# Patient Record
Sex: Female | Born: 1980 | Race: Black or African American | Hispanic: No | Marital: Married | Smoking: Never smoker
Health system: Southern US, Community
[De-identification: ages and names within clinical notes are randomized; demographics above are authoritative.]

## PROBLEM LIST (undated history)

## (undated) ENCOUNTER — Inpatient Hospital Stay (HOSPITAL_COMMUNITY): Payer: Self-pay

## (undated) DIAGNOSIS — K219 Gastro-esophageal reflux disease without esophagitis: Secondary | ICD-10-CM

## (undated) DIAGNOSIS — Z9221 Personal history of antineoplastic chemotherapy: Secondary | ICD-10-CM

## (undated) DIAGNOSIS — L709 Acne, unspecified: Secondary | ICD-10-CM

## (undated) DIAGNOSIS — F329 Major depressive disorder, single episode, unspecified: Secondary | ICD-10-CM

## (undated) DIAGNOSIS — F32A Depression, unspecified: Secondary | ICD-10-CM

## (undated) DIAGNOSIS — K589 Irritable bowel syndrome without diarrhea: Secondary | ICD-10-CM

## (undated) DIAGNOSIS — R7303 Prediabetes: Secondary | ICD-10-CM

## (undated) DIAGNOSIS — C50911 Malignant neoplasm of unspecified site of right female breast: Secondary | ICD-10-CM

## (undated) DIAGNOSIS — Z87820 Personal history of traumatic brain injury: Secondary | ICD-10-CM

## (undated) DIAGNOSIS — Z923 Personal history of irradiation: Secondary | ICD-10-CM

## (undated) HISTORY — DX: Gastro-esophageal reflux disease without esophagitis: K21.9

## (undated) HISTORY — PX: WISDOM TOOTH EXTRACTION: SHX21

## (undated) HISTORY — DX: Irritable bowel syndrome, unspecified: K58.9

---

## 2010-08-26 ENCOUNTER — Encounter: Admission: RE | Admit: 2010-08-26 | Discharge: 2010-08-26 | Payer: Self-pay | Admitting: Internal Medicine

## 2011-08-06 ENCOUNTER — Encounter: Payer: Self-pay | Admitting: Internal Medicine

## 2011-08-19 ENCOUNTER — Ambulatory Visit: Payer: Self-pay | Admitting: Internal Medicine

## 2011-09-12 ENCOUNTER — Emergency Department (HOSPITAL_COMMUNITY)
Admission: EM | Admit: 2011-09-12 | Discharge: 2011-09-13 | Disposition: A | Discharge status: Home / Self Care | Payer: 59 | Attending: Emergency Medicine | Admitting: Emergency Medicine

## 2011-09-12 ENCOUNTER — Emergency Department (HOSPITAL_COMMUNITY): Payer: 59

## 2011-09-12 DIAGNOSIS — S62639A Displaced fracture of distal phalanx of unspecified finger, initial encounter for closed fracture: Secondary | ICD-10-CM | POA: Insufficient documentation

## 2011-09-12 DIAGNOSIS — W230XXA Caught, crushed, jammed, or pinched between moving objects, initial encounter: Secondary | ICD-10-CM | POA: Insufficient documentation

## 2011-09-12 DIAGNOSIS — M7989 Other specified soft tissue disorders: Secondary | ICD-10-CM | POA: Insufficient documentation

## 2011-09-12 DIAGNOSIS — Y92009 Unspecified place in unspecified non-institutional (private) residence as the place of occurrence of the external cause: Secondary | ICD-10-CM | POA: Insufficient documentation

## 2011-12-18 ENCOUNTER — Ambulatory Visit (INDEPENDENT_AMBULATORY_CARE_PROVIDER_SITE_OTHER): Payer: 59

## 2011-12-18 DIAGNOSIS — H6990 Unspecified Eustachian tube disorder, unspecified ear: Secondary | ICD-10-CM

## 2011-12-18 DIAGNOSIS — J301 Allergic rhinitis due to pollen: Secondary | ICD-10-CM

## 2011-12-18 DIAGNOSIS — J069 Acute upper respiratory infection, unspecified: Secondary | ICD-10-CM

## 2011-12-18 DIAGNOSIS — H6980 Other specified disorders of Eustachian tube, unspecified ear: Secondary | ICD-10-CM

## 2012-06-22 ENCOUNTER — Encounter (INDEPENDENT_AMBULATORY_CARE_PROVIDER_SITE_OTHER): Payer: 59 | Admitting: Physician Assistant

## 2012-09-21 NOTE — Progress Notes (Signed)
This encounter was created in error - please disregard.

## 2013-02-04 ENCOUNTER — Ambulatory Visit (INDEPENDENT_AMBULATORY_CARE_PROVIDER_SITE_OTHER): Payer: 59 | Admitting: Family Medicine

## 2013-02-04 VITALS — BP 98/60 | HR 66 | Temp 98.4°F | Resp 16 | Ht 64.0 in | Wt 155.0 lb

## 2013-02-04 DIAGNOSIS — R0982 Postnasal drip: Secondary | ICD-10-CM

## 2013-02-04 DIAGNOSIS — J039 Acute tonsillitis, unspecified: Secondary | ICD-10-CM

## 2013-02-04 DIAGNOSIS — J029 Acute pharyngitis, unspecified: Secondary | ICD-10-CM

## 2013-02-04 LAB — POCT RAPID STREP A (OFFICE): Rapid Strep A Screen: NEGATIVE

## 2013-02-04 MED ORDER — AMOXICILLIN 500 MG PO CAPS: 500.0000 mg | ORAL_CAPSULE | Freq: Two times a day (BID) | ORAL | Status: DC

## 2013-02-04 MED ORDER — FLUTICASONE PROPIONATE 50 MCG/ACT NA SUSP: 2.0000 | Freq: Every day | NASAL | Status: DC

## 2013-02-04 NOTE — Progress Notes (Signed)
[facility]:  Office Visit  Chief Complaint:  Chief Complaint  Patient presents with  . Sore Throat    1 week    HPI: Brianna [name] is a 32 y.o. female who complains of  Sore throat x 1 week, took Nyquil , cough yellow and thick. BUt only once during the day. She has laryngitis. Had slight fever,muscle aches and pain. No flu vaccine since 2012. Works at [employer]. 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 + PND.   Past Medical History  Diagnosis Date  . GERD (gastroesophageal reflux disease)    History reviewed. No pertinent past surgical history. History   Social History  . Marital Status: Single    Spouse Name: N/A    Number of Children: N/A  . Years of Education: N/A   Social History Main Topics  . Smoking status: Never Smoker   . Smokeless tobacco: None  . Alcohol Use: Yes  . Drug Use: No  . Sexually Active: None   Other Topics Concern  . None   Social History Narrative  . None   Family History  Problem Relation Age of Onset  . Heart disease Maternal Grandmother   . Stroke Maternal Grandfather    No Known Allergies Prior to Admission medications   Medication Sig Start Date End Date Taking? Authorizing Provider  Sulfacetamide Sodium-Sulfur (CLENIA FOAMING WASH) 10-5 % EMUL Apply topically.   Yes Historical Provider, MD  hydroquinone 4 % cream Apply topically 2 (two) times daily. Not sure if 2% or 4% strength.    Historical Provider, MD     [name]: The patient denies fevers, chills, night sweats, unintentional weight loss, chest pain, palpitations, wheezing, dyspnea on exertion, nausea, vomiting, abdominal pain, dysuria, hematuria, melena, numbness, weakness, or tingling.   All other systems have been reviewed and were otherwise negative with the exception of those mentioned in the HPI and as above.    PHYSICAL EXAM: Filed Vitals:   02/04/13 1152  BP: 98/60  Pulse: 66  Temp: 98.4 F (36.9 C)  Resp: 16    Filed Vitals:   02/04/13 1152  Height: 5\' 4"  (1.626 m)  Weight: 155 lb (70.308 kg)   Body mass index is 26.59 kg/(m^2).  General: Alert, no acute distress HEENT:  Normocephalic, atraumatic, oropharynx patent. TM nl. Erythematous throat. No exudates. No sinus tenderness, boggy nose. + PND Cardiovascular:  Regular rate and rhythm, no rubs murmurs or gallops.  No Carotid bruits, radial pulse intact. No pedal edema.  Respiratory: Clear to auscultation bilaterally.  No wheezes, rales, or rhonchi.  No cyanosis, no use of accessory musculature GI: No organomegaly, abdomen is soft and non-tender, positive bowel sounds.  No masses. Skin: No rashes. Neurologic: Facial musculature symmetric. Psychiatric: Patient is appropriate throughout our interaction. Lymphatic: No cervical lymphadenopathy Musculoskeletal: Gait intact.   LABS: Results for orders placed in visit on 02/04/13  POCT RAPID STREP A (OFFICE)      Result Value Range   Rapid Strep A Screen Negative  Negative     EKG/XRAY:   Primary read interpreted by Dr. Conley Rolls at [facility].   ASSESSMENT/PLAN: Encounter Diagnoses  Name Primary?  . Acute pharyngitis Yes  . Acute tonsillitis   . Post-nasal drip    Will do trial of flonase and salt water gargles, if no improvement then may take abx May take abx Amoxacillin 500 mg BID x 10 days.  Rx Diflucan 150 mg      Brianna [name],  Brianna Diebold PHUONG, DO 02/04/2013 12:25 PM

## 2013-02-15 ENCOUNTER — Telehealth: Payer: Self-pay

## 2013-02-15 NOTE — Telephone Encounter (Signed)
PT STATES SHE NEED TO HAVE [place] CALL IN A DIFLUCAN TO THE PHARMACY. WAS TOLD SHE COULD GET IF NEEDED. PLEASE CALL [phone]    [facility] [address]

## 2013-02-16 MED ORDER — FLUCONAZOLE 150 MG PO TABS: 150.0000 mg | ORAL_TABLET | Freq: Once | ORAL | Status: DC

## 2013-02-16 NOTE — Telephone Encounter (Signed)
Sent in per protocol she just completed Antibiotics

## 2013-04-12 ENCOUNTER — Ambulatory Visit (INDEPENDENT_AMBULATORY_CARE_PROVIDER_SITE_OTHER): Payer: 59 | Admitting: Family Medicine

## 2013-04-12 VITALS — BP 108/62 | HR 80 | Temp 98.8°F | Resp 16 | Ht 64.0 in | Wt 157.6 lb

## 2013-04-12 DIAGNOSIS — R0982 Postnasal drip: Secondary | ICD-10-CM

## 2013-04-12 DIAGNOSIS — J329 Chronic sinusitis, unspecified: Secondary | ICD-10-CM

## 2013-04-12 DIAGNOSIS — J040 Acute laryngitis: Secondary | ICD-10-CM

## 2013-04-12 DIAGNOSIS — J039 Acute tonsillitis, unspecified: Secondary | ICD-10-CM

## 2013-04-12 DIAGNOSIS — J029 Acute pharyngitis, unspecified: Secondary | ICD-10-CM

## 2013-04-12 MED ORDER — METHYLPREDNISOLONE ACETATE 40 MG/ML IJ SUSP
80.0000 mg | Freq: Once | INTRAMUSCULAR | Status: AC
  Administered 2013-04-12: 80 mg via INTRAMUSCULAR

## 2013-04-12 MED ORDER — FLUTICASONE PROPIONATE 50 MCG/ACT NA SUSP: 2.0000 | Freq: Every day | NASAL | Status: DC

## 2013-04-12 MED ORDER — CETIRIZINE HCL 10 MG PO TABS: 10.0000 mg | ORAL_TABLET | Freq: Every day | ORAL | Status: DC

## 2013-04-12 MED ORDER — METHYLPREDNISOLONE ACETATE 80 MG/ML IJ SUSP
80.0000 mg | Freq: Once | INTRAMUSCULAR | Status: DC
Start: 2013-04-12 — End: 2013-04-12

## 2013-04-12 NOTE — Patient Instructions (Addendum)
Hot showers or breathing in steam may help loosen the congestion.  Using a netti pot or sinus rinse is also likely to help you feel better and keep this from progressing.  Use the fluticasone nasal spray every night before bed for at least 2 weeks.  I recommend augmenting with 12 hr sudafed (behind the counter) and generic mucinex to help you move out the congestion.  If no improvement or you are getting worse, come back as you might need a course of steroids or antibiotics but hopefully with all of the above, you can avoid it.  Laryngitis At the top of your windpipe is your voice box. It is the source of your voice. Inside your voice box are 2 bands of muscles called vocal cords. When you breathe, your vocal cords are relaxed and open so that air can get into the lungs. When you decide to say something, these cords come together and vibrate. The sound from these vibrations goes into your throat and comes out through your mouth as sound. Laryngitis is an inflammation of the vocal cords that causes hoarseness, cough, loss of voice, sore throat, and dry throat. Laryngitis can be temporary (acute) or long-term (chronic). Most cases of acute laryngitis improve with time.Chronic laryngitis lasts for more than 3 weeks. CAUSES Laryngitis can often be related to excessive smoking, talking, or yelling, as well as inhalation of toxic fumes and allergies. Acute laryngitis is usually caused by a viral infection, vocal strain, measles or mumps, or bacterial infections. Chronic laryngitis is usually caused by vocal cord strain, vocal cord injury, postnasal drip, growths on the vocal cords, or acid reflux. SYMPTOMS   Cough.  Sore throat.  Dry throat. RISK FACTORS  Respiratory infections.  Exposure to irritating substances, such as cigarette smoke, excessive amounts of alcohol, stomach acids, and workplace chemicals.  Voice trauma, such as vocal cord injury from shouting or speaking too loud. DIAGNOSIS  Your  cargiver will perform a physical exam. During the physical exam, your caregiver will examine your throat. The most common sign of laryngitis is hoarseness. Laryngoscopy may be necessary to confirm the diagnosis of this condition. This procedure allows your caregiver to look into the larynx. HOME CARE INSTRUCTIONS  Drink enough fluids to keep your urine clear or pale yellow.  Rest until you no longer have symptoms or as directed by your caregiver.  Breathe in moist air.  Take all medicine as directed by your caregiver.  Do not smoke.  Talk as little as possible (this includes whispering).  Write on paper instead of talking until your voice is back to normal.  Follow up with your caregiver if your condition has not improved after 10 days. SEEK MEDICAL CARE IF:   You have trouble breathing.  You cough up blood.  You have persistent fever.  You have increasing pain.  You have difficulty swallowing. MAKE SURE YOU:  Understand these instructions.  Will watch your condition.  Will get help right away if you are not doing well or get worse. Document Released: 11/09/2005 Document Revised: 02/01/2012 Document Reviewed: 01/15/2011 [facility] Patient Information 2014 [place], [place].

## 2013-04-12 NOTE — Progress Notes (Signed)
Subjective:    Patient ID: Brianna [name], female    DOB: 1981-11-08, 32 y.o.   MRN: [id number] Chief Complaint  Patient presents with  . Hoarse    4 days  . Sinusitis    HPI  Last wk she had a bad sinus HA followed by pnd for four days.  Then 5d prev she dev severe sore throat - had to take asa for her throat and the following day she completely  lost her voice - has been trying to rest it since but it has not come back at all.  Still w/ sore throat in evening and morning and lots of draininage and mucous in mouth - esp at night and in morning.  Did have a fever last week w/ sweats but was just finishing period so thought that that might be complicating sxs.  Not to much nasal congestion - did have an itchy throat for a day after her sinus HA - the HA was so severe she had an aura where she saw a white line through her vision and pressure above her left eye. Took claritin for a few d which seemed to help.  Not currently using any otc meds other than halls cough drops and hot tea.  Has stayed home work at week so has been letting voice rest but tried to go back to work today and after just 1 hr at [employer] was completely gone again.  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  Occ cough.  Still has tonsils. No known sick contacts.  Past Medical History  Diagnosis Date  . GERD (gastroesophageal reflux disease)    Current Outpatient Prescriptions on File Prior to Visit  Medication Sig Dispense Refill  . Sulfacetamide Sodium-Sulfur (CLENIA FOAMING WASH) 10-5 % EMUL Apply topically.       No current facility-administered medications on file prior to visit.   No Known Allergies   Review of Systems  Constitutional: Positive for fever, chills, diaphoresis, activity change, appetite change and fatigue. Negative for unexpected weight change.  HENT: Positive for sore throat, trouble swallowing, neck pain, voice change, postnasal drip and sinus pressure. Negative for ear pain,  nosebleeds, congestion, rhinorrhea, sneezing, mouth sores, neck stiffness and ear discharge.   Eyes: Negative for pain and itching.  Respiratory: Positive for cough. Negative for shortness of breath.   Cardiovascular: Negative for chest pain.  Gastrointestinal: Negative for nausea, vomiting, abdominal pain, diarrhea and constipation.  Genitourinary: Negative for dysuria.  Musculoskeletal: Positive for myalgias. Negative for joint swelling, arthralgias and gait problem.  Neurological: Positive for headaches. Negative for dizziness and syncope.  Hematological: Positive for adenopathy.  Psychiatric/Behavioral: Positive for sleep disturbance.      BP 108/62  Pulse 80  Temp(Src) 98.8 F (37.1 C) (Oral)  Resp 16  Ht 5\' 4"  (1.626 m)  Wt 157 lb 9.6 oz (71.487 kg)  BMI 27.04 kg/m2  SpO2 100%  LMP 04/01/2013 Objective:   Physical Exam  Constitutional: She is oriented to person, place, and time. She appears well-developed and well-nourished. She does not appear ill. No distress.  Voice at whisper - cracks with any phonation  HENT:  Head: Normocephalic and atraumatic.  Right Ear: External ear and ear canal normal. A middle ear effusion is present.  Left Ear: External ear and ear canal normal. A middle ear effusion is present.  Nose: Mucosal edema and rhinorrhea present. Right sinus exhibits no maxillary sinus tenderness and no frontal sinus tenderness. Left sinus exhibits no maxillary sinus  tenderness and no frontal sinus tenderness.  Mouth/Throat: Uvula is midline and mucous membranes are normal. Posterior oropharyngeal erythema present. No oropharyngeal exudate, posterior oropharyngeal edema or tonsillar abscesses.  1+ tonsils bilaterally  Eyes: Conjunctivae are normal. Right eye exhibits no discharge. Left eye exhibits no discharge. No scleral icterus.  Neck: Normal range of motion. Neck supple.  Cardiovascular: Normal rate, regular rhythm, normal heart sounds and intact distal pulses.    Pulmonary/Chest: Effort normal and breath sounds normal.  Lymphadenopathy:       Head (right side): Submandibular and tonsillar adenopathy present. No preauricular and no posterior auricular adenopathy present.       Head (left side): Submandibular and tonsillar adenopathy present. No preauricular and no posterior auricular adenopathy present.    She has cervical adenopathy.       Right cervical: Superficial cervical adenopathy present. No deep cervical and no posterior cervical adenopathy present.      Left cervical: Superficial cervical adenopathy present. No deep cervical and no posterior cervical adenopathy present.       Right: No supraclavicular adenopathy present.       Left: No supraclavicular adenopathy present.  Neurological: She is alert and oriented to person, place, and time.  Skin: Skin is warm and dry. She is not diaphoretic. No erythema.  Psychiatric: She has a normal mood and affect. Her behavior is normal.      Assessment & Plan:  Sinusitis- Plan: methylPREDNISolone acetate (DEPO-MEDROL) 80 MG/ML injection IM x 1 now, start zyrtec and sudafed.  Try netti pot.  Post-nasal drip - Plan: fluticasone (FLONASE) 50 MCG/ACT nasal spray  Laryngitis - Plan: Viral so will resolve on own but make sure no other exposures since immunosupressed w/ IM steroid - out of work x 2d  Meds ordered this encounter  Medications  . DISCONTD: methylPREDNISolone acetate (DEPO-MEDROL) 80 MG/ML injection    Sig: Inject 1 mL (80 mg total) into the muscle once.    Dispense:  5 mL    Refill:  0  . fluticasone (FLONASE) 50 MCG/ACT nasal spray    Sig: Place 2 sprays into the nose at bedtime.    Dispense:  16 g    Refill:  6  . cetirizine (ZYRTEC) 10 MG tablet    Sig: Take 1 tablet (10 mg total) by mouth daily.    Dispense:  30 tablet    Refill:  11  . methylPREDNISolone acetate (DEPO-MEDROL) injection 80 mg    Sig:

## 2013-06-26 ENCOUNTER — Encounter (HOSPITAL_COMMUNITY): Payer: Self-pay | Admitting: *Deleted

## 2013-06-26 ENCOUNTER — Inpatient Hospital Stay (HOSPITAL_COMMUNITY)
Admission: AD | Admit: 2013-06-26 | Discharge: 2013-06-26 | Disposition: A | Discharge status: Home / Self Care | Payer: 59 | Source: Ambulatory Visit | Attending: Obstetrics and Gynecology | Admitting: Obstetrics and Gynecology

## 2013-06-26 ENCOUNTER — Other Ambulatory Visit: Payer: Self-pay | Admitting: Obstetrics and Gynecology

## 2013-06-26 DIAGNOSIS — O00109 Unspecified tubal pregnancy without intrauterine pregnancy: Secondary | ICD-10-CM | POA: Insufficient documentation

## 2013-06-26 LAB — CBC WITH DIFFERENTIAL/PLATELET
Basophils Absolute: 0 10*3/uL (ref 0.0–0.1)
Basophils Relative: 0 % (ref 0–1)
Eosinophils Absolute: 0 10*3/uL (ref 0.0–0.7)
Eosinophils Relative: 0 % (ref 0–5)
HCT: 38.6 % (ref 36.0–46.0)
Hemoglobin: 13.4 g/dL (ref 12.0–15.0)
MCV: 89.1 fL (ref 78.0–100.0)
Metamyelocytes Relative: 0 %
Monocytes Relative: 7 % (ref 3–12)
Neutro Abs: 4.4 10*3/uL (ref 1.7–7.7)
Platelets: 300 10*3/uL (ref 150–400)
RBC: 4.33 MIL/uL (ref 3.87–5.11)
WBC: 8 10*3/uL (ref 4.0–10.5)

## 2013-06-26 LAB — TYPE AND SCREEN
ABO/RH(D): AB POS
Antibody Screen: NEGATIVE

## 2013-06-26 LAB — CREATININE, SERUM: Creatinine, Ser: 0.85 mg/dL (ref 0.50–1.10)

## 2013-06-26 LAB — ABO/RH: ABO/RH(D): AB POS

## 2013-06-26 MED ORDER — RHO D IMMUNE GLOBULIN 1500 UNIT/2ML IJ SOLN
300.0000 ug | Freq: Once | INTRAMUSCULAR | Status: DC
  Filled 2013-06-26: qty 2

## 2013-06-26 MED ORDER — METHOTREXATE INJECTION FOR WOMEN'S HOSPITAL
50.0000 mg/m2 | Freq: Once | INTRAMUSCULAR | Status: AC
  Administered 2013-06-26: 90 mg via INTRAMUSCULAR
  Filled 2013-06-26: qty 1.8

## 2013-06-26 NOTE — MAU Note (Signed)
NOT IN LOBBY- IN LAB

## 2013-06-26 NOTE — MAU Note (Signed)
PT SAYS SHE WENT TO OFFICE LAST WEEK TO CONFIRM PREG-  BY BLOOD-  PT HAD ALREADY HOME PREG TEST. Brianna [name] HAD SPOTTING LAST WEEK.    THEN SHE HAD LIGHT SPOTTING TODAY-  SO SHE WENT TO DR OFFICE.- AT 430PM.-   HAD U/S-LEFT ECTOPIC -  SENT HER HERE.    DR COUSINS  DISCUSSED WITH PT ABOUT METHOTREXATE.

## 2013-07-03 ENCOUNTER — Inpatient Hospital Stay (HOSPITAL_COMMUNITY)
Admission: AD | Admit: 2013-07-03 | Discharge: 2013-07-03 | Disposition: A | Discharge status: Home / Self Care | Payer: 59 | Source: Ambulatory Visit | Attending: Obstetrics and Gynecology | Admitting: Obstetrics and Gynecology

## 2013-07-03 ENCOUNTER — Other Ambulatory Visit: Payer: Self-pay | Admitting: Obstetrics and Gynecology

## 2013-07-03 DIAGNOSIS — O00109 Unspecified tubal pregnancy without intrauterine pregnancy: Secondary | ICD-10-CM | POA: Insufficient documentation

## 2013-07-03 LAB — BUN: BUN: 8 mg/dL (ref 6–23)

## 2013-07-03 LAB — CBC WITH DIFFERENTIAL/PLATELET
Basophils Absolute: 0 10*3/uL (ref 0.0–0.1)
Basophils Relative: 0 % (ref 0–1)
Eosinophils Absolute: 0 10*3/uL (ref 0.0–0.7)
HCT: 36.7 % (ref 36.0–46.0)
Hemoglobin: 12.5 g/dL (ref 12.0–15.0)
MCH: 30.5 pg (ref 26.0–34.0)
MCHC: 34.1 g/dL (ref 30.0–36.0)
Monocytes Absolute: 0.5 10*3/uL (ref 0.1–1.0)
Monocytes Relative: 9 % (ref 3–12)
Neutrophils Relative %: 46 % (ref 43–77)
RDW: 13.6 % (ref 11.5–15.5)

## 2013-07-03 LAB — AST: AST: 15 U/L (ref 0–37)

## 2013-07-03 MED ORDER — METHOTREXATE INJECTION FOR WOMEN'S HOSPITAL
50.0000 mg/m2 | Freq: Once | INTRAMUSCULAR | Status: AC
  Administered 2013-07-03: 90 mg via INTRAMUSCULAR
  Filled 2013-07-03: qty 1.8

## 2013-07-03 MED ORDER — METHOTREXATE INJECTION FOR WOMEN'S HOSPITAL: 50.0000 mg/m2 | Freq: Once | INTRAMUSCULAR | Status: DC

## 2013-07-03 NOTE — MAU Note (Signed)
Pt here for MTX injection sent from office. Labs called to Dr. Cherly Hensen.

## 2013-11-29 ENCOUNTER — Ambulatory Visit (INDEPENDENT_AMBULATORY_CARE_PROVIDER_SITE_OTHER): Payer: 59 | Admitting: Physician Assistant

## 2013-11-29 VITALS — BP 118/72 | HR 104 | Temp 101.2°F | Resp 18 | Ht 64.0 in | Wt 169.0 lb

## 2013-11-29 DIAGNOSIS — R509 Fever, unspecified: Secondary | ICD-10-CM

## 2013-11-29 DIAGNOSIS — J111 Influenza due to unidentified influenza virus with other respiratory manifestations: Secondary | ICD-10-CM

## 2013-11-29 DIAGNOSIS — R059 Cough, unspecified: Secondary | ICD-10-CM

## 2013-11-29 DIAGNOSIS — R05 Cough: Secondary | ICD-10-CM

## 2013-11-29 LAB — POCT INFLUENZA A/B
INFLUENZA A, POC: POSITIVE
Influenza B, POC: NEGATIVE

## 2013-11-29 MED ORDER — ALBUTEROL SULFATE HFA 108 (90 BASE) MCG/ACT IN AERS: 2.0000 | INHALATION_SPRAY | RESPIRATORY_TRACT | Status: DC | PRN

## 2013-11-29 MED ORDER — ALBUTEROL SULFATE (2.5 MG/3ML) 0.083% IN NEBU: 2.5000 mg | INHALATION_SOLUTION | Freq: Once | RESPIRATORY_TRACT | Status: DC

## 2013-11-29 MED ORDER — E-Z SPACER DEVI: Status: DC

## 2013-11-29 MED ORDER — OSELTAMIVIR PHOSPHATE 75 MG PO CAPS: 75.0000 mg | ORAL_CAPSULE | Freq: Two times a day (BID) | ORAL | Status: DC

## 2013-11-29 MED ORDER — HYDROCODONE-HOMATROPINE 5-1.5 MG/5ML PO SYRP: ORAL_SOLUTION | ORAL | Status: DC

## 2013-11-29 MED ORDER — IPRATROPIUM BROMIDE 0.02 % IN SOLN: 0.5000 mg | Freq: Once | RESPIRATORY_TRACT | Status: DC

## 2013-11-29 NOTE — Progress Notes (Signed)
Subjective:    Patient ID: Brianna [name], female    DOB: 08/31/1981, 33 y.o.   MRN: [id number]  HPI Primary Physician: No primary provider on file.  Chief Complaint: Flu like symptoms for 3-4 days.   HPI: 33 y.o. female with history below presents with a 3-4 day history of nasal congestion, rhinorrhea, sore throat, post nasal drip, cough, fever, chills, myalgias, and fatigue. Uncertain of T max. Cough is productive of clear sputum and not associated with time of day. No SOB or wheezing. Sinus headaches. No neck stiffness. Muffled hearing. Decreased appetite. Pushing fluids. She has gone to work everyday this week. Her boyfriend is also sick with similar symptoms. She did not get an influenza vaccine this year. No history of being immunocompromised or lung issues .    Past Medical History  Diagnosis Date  . GERD (gastroesophageal reflux disease)      Home Meds: Prior to Admission medications   Medication Sig Start Date End Date Taking? Authorizing Provider  benzoyl peroxide (KP BENZOYL PEROXIDE) 5 % gel Apply 1 application topically 2 (two) times daily.   Yes Historical Provider, MD  famotidine (PEPCID) 10 MG tablet Take 10 mg by mouth daily.   Yes Historical Provider, MD  hydroquinone 4 % cream Apply 1 application topically 2 (two) times daily.   Yes Historical Provider, MD  Sulfacetamide Sodium-Sulfur (CLENIA FOAMING Phil Campbell) 10-5 % EMUL Apply topically.   Yes Historical Provider, MD    Allergies: No Known Allergies  History   Social History  . Marital Status: Single    Spouse Name: N/A    Number of Children: N/A  . Years of Education: N/A   Occupational History  . Not on file.   Social History Main Topics  . Smoking status: Never Smoker   . Smokeless tobacco: Not on file  . Alcohol Use: Yes  . Drug Use: No  . Sexual Activity: Not on file   Other Topics Concern  . Not on file   Social History Narrative  . No narrative on file       Review of Systems    Constitutional: Positive for fever, chills, appetite change and fatigue.       Myalgias  HENT: Positive for congestion, hearing loss, postnasal drip, rhinorrhea, sinus pressure, sneezing and sore throat. Negative for ear pain and tinnitus.   Eyes: Positive for photophobia.  Respiratory: Positive for cough. Negative for shortness of breath and wheezing.        Cough is productive in the morning.   Gastrointestinal: Negative for nausea, vomiting and diarrhea.  Neurological: Positive for headaches.       Sinus pressure. No neck stiffness.        Objective:   Physical Exam  Physical Exam: Blood pressure 118/72, pulse 104, temperature 101.2 F (38.4 C), resp. rate 18, height 5\' 4"  (1.626 m), weight 169 lb (76.658 kg), last menstrual period 11/16/2013, SpO2 96.00%, unknown if currently breastfeeding., Body mass index is 28.99 kg/(m^2). General: Well developed, well nourished, in no acute distress. Head: Normocephalic, atraumatic, eyes without discharge, sclera non-icteric, nares are without discharge. Bilateral auditory canals clear, TM's are without perforation, pearly grey and translucent with reflective cone of light bilaterally. Oral cavity moist, posterior pharynx without exudate, erythema, peritonsillar abscess, or post nasal drip.  Neck: Supple. No thyromegaly. Full ROM. No lymphadenopathy. Lungs: Clear bilaterally to auscultation with mild expiratory wheezes. No rales or rhonchi. Breathing is unlabored. Status post nebulizer patient feels: much better. Heart: RRR  with S1 S2. No murmurs, rubs, or gallops appreciated. Msk:  Strength and tone normal for age. Extremities/Skin: Warm and dry. No clubbing or cyanosis. No edema. No rashes or suspicious lesions. Neuro: Alert and oriented X 3. Moves all extremities spontaneously. Gait is normal. CNII-XII grossly in tact. Psych:  Responds to questions appropriately with a normal affect.   Labs: Results for orders placed in visit on 11/29/13   POCT INFLUENZA A/B      Result Value Range   Influenza A, POC Positive     Influenza B, POC Negative          Assessment & Plan:  33 year old female with influenza A, cough, fever, chills, and myalgias -Tamiflu 75 mg 1 po bid #10 no RF -Proventil 2 puffs inhaled q 4-6 hours prn #1 no RF -Hycodan #4oz 1 tsp po q 4-6 hours prn cough no RF SED -Tylenol/Motrin -Rest/fluids -Out of work the rest of this week -RTC precautions    Christell Faith, PA-C [facility] [address] [place], [place] [address] [phone] 11/29/2013 6:03 PM

## 2014-10-20 ENCOUNTER — Ambulatory Visit (INDEPENDENT_AMBULATORY_CARE_PROVIDER_SITE_OTHER): Payer: 59 | Admitting: Family Medicine

## 2014-10-20 VITALS — BP 118/74 | HR 79 | Temp 98.9°F | Resp 18 | Ht 63.5 in | Wt 162.4 lb

## 2014-10-20 DIAGNOSIS — N3090 Cystitis, unspecified without hematuria: Secondary | ICD-10-CM

## 2014-10-20 DIAGNOSIS — R3915 Urgency of urination: Secondary | ICD-10-CM

## 2014-10-20 LAB — POCT UA - MICROSCOPIC ONLY
CRYSTALS, UR, HPF, POC: NEGATIVE
Casts, Ur, LPF, POC: NEGATIVE
MUCUS UA: NEGATIVE
YEAST UA: NEGATIVE

## 2014-10-20 LAB — POCT URINALYSIS DIPSTICK
Bilirubin, UA: NEGATIVE
Glucose, UA: NEGATIVE
Nitrite, UA: NEGATIVE
PH UA: 6
PROTEIN UA: 30
SPEC GRAV UA: 1.025
UROBILINOGEN UA: 0.2

## 2014-10-20 MED ORDER — PHENAZOPYRIDINE HCL 200 MG PO TABS: 200.0000 mg | ORAL_TABLET | Freq: Three times a day (TID) | ORAL | Status: DC | PRN

## 2014-10-20 MED ORDER — SULFAMETHOXAZOLE-TRIMETHOPRIM 800-160 MG PO TABS: 1.0000 | ORAL_TABLET | Freq: Two times a day (BID) | ORAL | Status: DC

## 2014-10-20 NOTE — Progress Notes (Signed)
33 year old lady with dysuria for couple of days. She has a pressure sensation in her bladder. It has been sometime since she's had a urinary tract infection.  Objective: Lady in no acute distress. Bladder pressure sensation. No CVA tenderness.   Results for orders placed or performed in visit on 10/20/14  POCT urinalysis dipstick  Result Value Ref Range   Color, UA yellow    Clarity, UA cloudy    Glucose, UA neg    Bilirubin, UA neg    Ketones, UA trace    Spec Grav, UA 1.025    Blood, UA large    pH, UA 6.0    Protein, UA 30    Urobilinogen, UA 0.2    Nitrite, UA neg    Leukocytes, UA moderate (2+)   POCT UA - Microscopic Only  Result Value Ref Range   WBC, Ur, HPF, POC tntc    RBC, urine, microscopic tntc    Bacteria, U Microscopic trace    Mucus, UA neg    Epithelial cells, urine per micros 3-6    Crystals, Ur, HPF, POC neg    Casts, Ur, LPF, POC neg    Yeast, UA neg    Assessment: Dysuria Cystitis  Plan: Bactrim and Pyridium. Return if problems

## 2014-10-20 NOTE — Patient Instructions (Signed)
Take antibiotic one twice daily  Take the Pyridium tablets 1 pill 3 times daily only if needed for pressure or urinary pain. These pills will make the urine a dark brown orange color. They do not treat anything, but do relieve the discomfort. After 1-2 days you can discontinue them.  Drink plenty of fluids  Urinary Tract Infection Urinary tract infections (UTIs) can develop anywhere along your urinary tract. Your urinary tract is your body's drainage system for removing wastes and extra water. Your urinary tract includes two kidneys, two ureters, a bladder, and a urethra. Your kidneys are a pair of bean-shaped organs. Each kidney is about the size of your fist. They are located below your ribs, one on each side of your spine. CAUSES Infections are caused by microbes, which are microscopic organisms, including fungi, viruses, and bacteria. These organisms are so small that they can only be seen through a microscope. Bacteria are the microbes that most commonly cause UTIs. SYMPTOMS  Symptoms of UTIs may vary by age and gender of the patient and by the location of the infection. Symptoms in young women typically include a frequent and intense urge to urinate and a painful, burning feeling in the bladder or urethra during urination. Older women and men are more likely to be tired, shaky, and weak and have muscle aches and abdominal pain. A fever may mean the infection is in your kidneys. Other symptoms of a kidney infection include pain in your back or sides below the ribs, nausea, and vomiting. DIAGNOSIS To diagnose a UTI, your caregiver will ask you about your symptoms. Your caregiver also will ask to provide a urine sample. The urine sample will be tested for bacteria and white blood cells. White blood cells are made by your body to help fight infection. TREATMENT  Typically, UTIs can be treated with medication. Because most UTIs are caused by a bacterial infection, they usually can be treated with the  use of antibiotics. The choice of antibiotic and length of treatment depend on your symptoms and the type of bacteria causing your infection. HOME CARE INSTRUCTIONS  If you were prescribed antibiotics, take them exactly as your caregiver instructs you. Finish the medication even if you feel better after you have only taken some of the medication.  Drink enough water and fluids to keep your urine clear or pale yellow.  Avoid caffeine, tea, and carbonated beverages. They tend to irritate your bladder.  Empty your bladder often. Avoid holding urine for long periods of time.  Empty your bladder before and after sexual intercourse.  After a bowel movement, women should cleanse from front to back. Use each tissue only once. SEEK MEDICAL CARE IF:   You have back pain.  You develop a fever.  Your symptoms do not begin to resolve within 3 days. SEEK IMMEDIATE MEDICAL CARE IF:   You have severe back pain or lower abdominal pain.  You develop chills.  You have nausea or vomiting.  You have continued burning or discomfort with urination. MAKE SURE YOU:   Understand these instructions.  Will watch your condition.  Will get help right away if you are not doing well or get worse. Document Released: 08/19/2005 Document Revised: 05/10/2012 Document Reviewed: 12/18/2011 [facility] Patient Information 2015 [place], [place]. This information is not intended to replace advice given to you by your health care provider. Make sure you discuss any questions you have with your health care provider.

## 2014-10-22 LAB — URINE CULTURE: Colony Count: 70000

## 2014-11-21 LAB — POCT UA - GLUCOSE/PROTEIN

## 2014-12-05 ENCOUNTER — Encounter: Payer: 59 | Attending: Obstetrics and Gynecology | Admitting: Dietician

## 2014-12-05 ENCOUNTER — Encounter: Payer: Self-pay | Admitting: Dietician

## 2014-12-05 VITALS — Ht 63.0 in | Wt 161.6 lb

## 2014-12-05 DIAGNOSIS — Z713 Dietary counseling and surveillance: Secondary | ICD-10-CM | POA: Diagnosis not present

## 2014-12-05 DIAGNOSIS — Z6828 Body mass index (BMI) 28.0-28.9, adult: Secondary | ICD-10-CM | POA: Insufficient documentation

## 2014-12-05 DIAGNOSIS — E669 Obesity, unspecified: Secondary | ICD-10-CM

## 2014-12-05 NOTE — Patient Instructions (Addendum)
-  Work on increasing overall activity -Try to cut juice in half  -Mix juice with seltzer water  -Try using juice boxes instead of large cartons  -Keep water available at home  -Try drinking water first in the morning -Continue to eat frequently  -Include a protein food with every meal and snack  Healthy weight loss: 1-2 pounds per week Goal weight: 145 lbs Weigh yourself 1x a week

## 2014-12-05 NOTE — Progress Notes (Signed)
  Medical Nutrition Therapy:  Appt start time: 1400 end time:  1450.   Assessment:  Primary concerns today: Brianna [name] is here today stating she was referred for elevated HgbA1c (6%). She states that she also wants to lose weight. She works full time as a Education officer, museum for [employer] She currently does not have a lot of work stress and she sleeps very well. She lives with her husband. Lost weight 2 years ago by working out 5 days a week. Unable to take Metformin as prescribed. Metformin causes diarrhea, weakness, and nausea.  Preferred Learning Style:  No preference indicated   Learning Readiness:   Ready   MEDICATIONS: see list   DIETARY INTAKE:  Avoided foods include tomato-based foods, red meat.    24-hr recall:  B ( AM): Cinnamon toast crunch cereal with whole milk or banana or apple  Snk ( AM): none  L (12:30-1 PM): normally goes out to lunch: wings, subs, black bean cakes (normally no fast food) Snk ( PM): usually none D ( PM): cereal or salmon or meatloaf or nachos or grilled chicken wings and vegetables Snk ( PM): not usually  Beverages: 32 oz juice per day (grape, orange, apple), water, black cherry soda  Usual physical activity: none  Estimated energy needs: 1800 calories 200 g carbohydrates 135 g protein 50 g fat  Progress Towards Goal(s):  In progress.   Nutritional Diagnosis:  NI-5.8.2 Excessive carbohydrate intake As related to 32 ounces of juice per day.  As evidenced by patient report and HgbA1c 6%..    Intervention:  Nutrition education provided. Explained HgbA1c lab value. Educated patient on metabolism of different macronutrients. Explained benefits of weight loss and exercise on glucose uptake. Goals: -Work on increasing overall activity -Try to cut juice in half  -Mix juice with seltzer water  -Try using juice boxes instead of large cartons  -Keep water available at home  -Try drinking water first in the morning -Continue to eat frequently   -Include a protein food with every meal and snack  Healthy weight loss: 1-2 pounds per week Goal weight: 145 lbs Weigh yourself 1x a week  Teaching Method Utilized:  Visual Auditory  Handouts given during visit include:  Meal planning card  MyPlate  29F CHO + protein snack sheet  Barriers to learning/adherence to lifestyle change: none  Demonstrated degree of understanding via:  Teach Back   Monitoring/Evaluation:  Dietary intake, exercise, labs, and body weight in 3 month(s).

## 2015-02-17 ENCOUNTER — Ambulatory Visit (INDEPENDENT_AMBULATORY_CARE_PROVIDER_SITE_OTHER): Payer: 59 | Admitting: Urgent Care

## 2015-02-17 VITALS — BP 118/72 | HR 75 | Temp 99.0°F | Resp 16 | Ht 63.5 in | Wt 148.4 lb

## 2015-02-17 DIAGNOSIS — N9489 Other specified conditions associated with female genital organs and menstrual cycle: Secondary | ICD-10-CM

## 2015-02-17 DIAGNOSIS — B373 Candidiasis of vulva and vagina: Secondary | ICD-10-CM

## 2015-02-17 DIAGNOSIS — N390 Urinary tract infection, site not specified: Secondary | ICD-10-CM

## 2015-02-17 DIAGNOSIS — R319 Hematuria, unspecified: Secondary | ICD-10-CM

## 2015-02-17 DIAGNOSIS — B3731 Acute candidiasis of vulva and vagina: Secondary | ICD-10-CM

## 2015-02-17 DIAGNOSIS — R350 Frequency of micturition: Secondary | ICD-10-CM

## 2015-02-17 DIAGNOSIS — R300 Dysuria: Secondary | ICD-10-CM

## 2015-02-17 DIAGNOSIS — R102 Pelvic and perineal pain: Secondary | ICD-10-CM

## 2015-02-17 LAB — POCT UA - MICROSCOPIC ONLY
CRYSTALS, UR, HPF, POC: NEGATIVE
Casts, Ur, LPF, POC: NEGATIVE
Mucus, UA: NEGATIVE
YEAST UA: NEGATIVE

## 2015-02-17 LAB — POCT URINALYSIS DIPSTICK
Bilirubin, UA: NEGATIVE
GLUCOSE UA: NEGATIVE
NITRITE UA: NEGATIVE
PH UA: 5.5
Protein, UA: 30
UROBILINOGEN UA: 0.2

## 2015-02-17 MED ORDER — PHENAZOPYRIDINE HCL 200 MG PO TABS: 200.0000 mg | ORAL_TABLET | Freq: Three times a day (TID) | ORAL | Status: DC | PRN

## 2015-02-17 MED ORDER — FLUCONAZOLE 150 MG PO TABS: 150.0000 mg | ORAL_TABLET | Freq: Once | ORAL | Status: DC

## 2015-02-17 MED ORDER — CIPROFLOXACIN HCL 500 MG PO TABS: 500.0000 mg | ORAL_TABLET | Freq: Two times a day (BID) | ORAL | Status: DC

## 2015-02-17 NOTE — Patient Instructions (Signed)

## 2015-02-17 NOTE — Progress Notes (Signed)
    MRN: [id number] DOB: November 02, 1981  Subjective:   Brianna [name] is a 34 y.o. female presenting for chief complaint of Dysuria; Urinary Frequency; Vaginal Discharge; and Vaginal Itching  Reports 2 day history of above dysuria, urinary frequency, pelvic pressure, now having left-sided flank pain, malaise. Denies fevers, hematuria, chest pain, shob, n/v, abdominal pain, vaginal irritation, genital rashes. Has not tried any medications for relief. Has a history of UTI's, last 09/2014, resolved with Bactrim. Denies smoking, occasional alcohol drink. Denies any other aggravating or relieving factors, no other questions or concerns.  Brianna [name] has a current medication list which includes the following prescription(s): clindamycin-benzoyl per (refr), drospirenone-ethinyl estradiol, famotidine, metformin, sulfacetamide sodium-sulfur, and vitamin d (ergocalciferol). She has No Known Allergies.  Brianna [name]  has a past medical history of GERD (gastroesophageal reflux disease); Prediabetes; and IBS (irritable bowel syndrome). Also  has no past surgical history on file.  [name] As in subjective.  Objective:   Vitals: BP 118/72 mmHg  Pulse 75  Temp(Src) 99 F (37.2 C) (Oral)  Resp 16  Ht 5' 3.5" (1.613 m)  Wt 148 lb 6.4 oz (67.314 kg)  BMI 25.87 kg/m2  SpO2 98%  LMP 01/25/2015  Physical Exam  Constitutional: She is oriented to person, place, and time and well-developed, well-nourished, and in no distress.  Cardiovascular: Normal rate, regular rhythm and intact distal pulses.  Exam reveals no gallop and no friction rub.   No murmur heard. Pulmonary/Chest: No respiratory distress. She has no wheezes. She has no rales. She exhibits no tenderness.  Abdominal: Soft. Bowel sounds are normal. She exhibits no distension and no mass. There is no tenderness.  Neurological: She is alert and oriented to person, place, and time.  Skin: Skin is warm and dry. No rash noted. No erythema. No pallor.   Results for  orders placed or performed in visit on 02/17/15 (from the past 24 hour(s))  POCT UA - Microscopic Only     Status: None   Collection Time: 02/17/15 11:33 AM  Result Value Ref Range   WBC, Ur, HPF, POC 10-15    RBC, urine, microscopic 3-6    Bacteria, U Microscopic trace    Mucus, UA neg    Epithelial cells, urine per micros 0-3    Crystals, Ur, HPF, POC neg    Casts, Ur, LPF, POC neg    Yeast, UA neg   POCT urinalysis dipstick     Status: None   Collection Time: 02/17/15 11:33 AM  Result Value Ref Range   Color, UA yellow    Clarity, UA cloudy    Glucose, UA neg    Bilirubin, UA neg    Ketones, UA trace    Spec Grav, UA >=1.030    Blood, UA mod    pH, UA 5.5    Protein, UA 30    Urobilinogen, UA 0.2    Nitrite, UA neg    Leukocytes, UA moderate (2+)    Assessment and Plan :   1. Dysuria 2. Urinary frequency 3. Pelvic pressure in female 4. Urinary tract infection with hematuria, site unspecified 5. Yeast infection of the vagina - Start ciprofloxacin for UTI, urine culture pending. Advised pyridium for symptomatic relief, diflucan given history of antibiotic associated yeast infection. - Return to clinic if symptoms fail to resolve.  Jaynee Eagles, PA-C [facility] [phone] 02/17/2015 11:39 AM

## 2015-02-19 ENCOUNTER — Encounter: Payer: Self-pay | Admitting: Urgent Care

## 2015-02-19 LAB — URINE CULTURE

## 2015-03-06 ENCOUNTER — Ambulatory Visit: Payer: 59 | Admitting: Dietician

## 2015-03-27 ENCOUNTER — Ambulatory Visit (INDEPENDENT_AMBULATORY_CARE_PROVIDER_SITE_OTHER): Payer: 59 | Admitting: Internal Medicine

## 2015-03-27 VITALS — BP 102/60 | HR 81 | Temp 98.4°F | Resp 16 | Ht 64.0 in | Wt 149.0 lb

## 2015-03-27 DIAGNOSIS — R21 Rash and other nonspecific skin eruption: Secondary | ICD-10-CM

## 2015-03-27 DIAGNOSIS — L299 Pruritus, unspecified: Secondary | ICD-10-CM

## 2015-03-27 DIAGNOSIS — B354 Tinea corporis: Secondary | ICD-10-CM

## 2015-03-27 LAB — POCT SKIN KOH: Skin KOH, POC: NEGATIVE

## 2015-03-27 MED ORDER — ECONAZOLE NITRATE 1 % EX CREA: TOPICAL_CREAM | Freq: Every day | CUTANEOUS | Status: DC

## 2015-03-27 MED ORDER — CLOBETASOL PROPIONATE 0.05 % EX CREA: 1.0000 "application " | TOPICAL_CREAM | Freq: Two times a day (BID) | CUTANEOUS | Status: DC

## 2015-03-27 NOTE — Patient Instructions (Signed)
Poison [employer] ivy is a inflammation of the skin (contact dermatitis) caused by touching the allergens on the leaves of the ivy plant following previous exposure to the plant. The rash usually appears 48 hours after exposure. The rash is usually bumps (papules) or blisters (vesicles) in a linear pattern. Depending on your own sensitivity, the rash may simply cause redness and itching, or it may also progress to blisters which may break open. These must be well cared for to prevent secondary bacterial (germ) infection, followed by scarring. Keep any open areas dry, clean, dressed, and covered with an antibacterial ointment if needed. The eyes may also get puffy. The puffiness is worst in the morning and gets better as the day progresses. This dermatitis usually heals without scarring, within 2 to 3 weeks without treatment. HOME CARE INSTRUCTIONS  Thoroughly wash with soap and water as soon as you have been exposed to poison ivy. You have about one half hour to remove the plant resin before it will cause the rash. This washing will destroy the oil or antigen on the skin that is causing, or will cause, the rash. Be sure to wash under your fingernails as any plant resin there will continue to spread the rash. Do not rub skin vigorously when washing affected area. Poison ivy cannot spread if no oil from the plant remains on your body. A rash that has progressed to weeping sores will not spread the rash unless you have not washed thoroughly. It is also important to wash any clothes you have been wearing as these may carry active allergens. The rash will return if you wear the unwashed clothing, even several days later. Avoidance of the plant in the future is the best measure. Poison ivy plant can be recognized by the number of leaves. Generally, poison ivy has three leaves with flowering branches on a single stem. Diphenhydramine may be purchased over the counter and used as needed for itching. Do not drive with  this medication if it makes you drowsy.Ask your caregiver about medication for children. SEEK MEDICAL CARE IF:  Open sores develop.  Redness spreads beyond area of rash.  You notice purulent (pus-like) discharge.  You have increased pain.  Other signs of infection develop (such as fever). Document Released: 11/06/2000 Document Revised: 02/01/2012 Document Reviewed: 04/19/2009 [facility] Patient Information 2015 [place], [place]. This information is not intended to replace advice given to you by your health care provider. Make sure you discuss any questions you have with your health care provider. Body Ringworm Ringworm (tinea corporis) is a fungal infection of the skin on the body. This infection is not caused by worms, but is actually caused by a fungus. Fungus normally lives on the top of your skin and can be useful. However, in the case of ringworms, the fungus grows out of control and causes a skin infection. It can involve any area of skin on the body and can spread easily from one person to another (contagious). Ringworm is a common problem for children, but it can affect adults as well. Ringworm is also often found in athletes, especially wrestlers who share equipment and mats.  CAUSES  Ringworm of the body is caused by a fungus called dermatophyte. It can spread by:  Touchingother people who are infected.  Touchinginfected pets.  Touching or sharingobjects that have been in contact with the infected person or pet (hats, combs, towels, clothing, sports equipment). SYMPTOMS   Itchy, raised red spots and bumps on the skin.  Ring-shaped  rash.  Redness near the border of the rash with a clear center.  Dry and scaly skin on or around the rash. Not every person develops a ring-shaped rash. Some develop only the red, scaly patches. DIAGNOSIS  Most often, ringworm can be diagnosed by performing a skin exam. Your caregiver may choose to take a skin scraping from the affected area.  The sample will be examined under the microscope to see if the fungus is present.  TREATMENT  Body ringworm may be treated with a topical antifungal cream or ointment. Sometimes, an antifungal shampoo that can be used on your body is prescribed. You may be prescribed antifungal medicines to take by mouth if your ringworm is severe, keeps coming back, or lasts a long time.  HOME CARE INSTRUCTIONS   Only take over-the-counter or prescription medicines as directed by your caregiver.  Wash the infected area and dry it completely before applying yourcream or ointment.  When using antifungal shampoo to treat the ringworm, leave the shampoo on the body for 3-5 minutes before rinsing.   Wear loose clothing to stop clothes from rubbing and irritating the rash.  Wash or change your bed sheets every night while you have the rash.  Have your pet treated by your veterinarian if it has the same infection. To prevent ringworm:   Practice good hygiene.  Wear sandals or shoes in public places and showers.  Do not share personal items with others.  Avoid touching red patches of skin on other people.  Avoid touching pets that have bald spots or wash your hands after doing so. SEEK MEDICAL CARE IF:   Your rash continues to spread after 7 days of treatment.  Your rash is not gone in 4 weeks.  The area around your rash becomes red, warm, tender, and swollen. Document Released: 11/06/2000 Document Revised: 08/03/2012 Document Reviewed: 05/23/2012 [facility] Patient Information 2015 [place], [place]. This information is not intended to replace advice given to you by your health care provider. Make sure you discuss any questions you have with your health care provider.

## 2015-03-27 NOTE — Progress Notes (Signed)
   Subjective:    Patient ID: Brianna [name], female    DOB: 09/07/1981, 33 y.o.   MRN: [id number]  HPI Brianna [name] to festival this weekend, now has single , circular itchy plaque left shoulder, back. Stood in [employer] and rain with lots of people. Never had this before. No fever, fatigue, no other system involved.   Review of Systems     Objective:   Physical Exam  Constitutional: She is oriented to person, place, and time. She appears well-developed and well-nourished. No distress.  HENT:  Head: Normocephalic.  Nose: Nose normal.  Eyes: EOM are normal. No scleral icterus.  Neck: Normal range of motion.  Pulmonary/Chest: Effort normal.  Musculoskeletal: Normal range of motion. She exhibits no edema or tenderness.  Lymphadenopathy:    She has no cervical adenopathy.  Neurological: She is alert and oriented to person, place, and time. She exhibits normal muscle tone. Coordination normal.  Skin: Skin is dry and intact. Lesion and rash noted. Rash is maculopapular. There is erythema.     Plaque, itchy, only one seen  Psychiatric: She has a normal mood and affect. Her behavior is normal. Thought content normal.  Vitals reviewed.  KOH  Results for orders placed or performed in visit on 03/27/15  POCT Skin KOH  Result Value Ref Range   Skin KOH, POC Negative    Hyphae seen by me       Assessment & Plan:  Rash/Itchy Probable tinea/econazole qd Clobetasol prn itch

## 2015-12-18 DIAGNOSIS — L700 Acne vulgaris: Secondary | ICD-10-CM | POA: Insufficient documentation

## 2016-08-23 DIAGNOSIS — Z87820 Personal history of traumatic brain injury: Secondary | ICD-10-CM

## 2016-08-23 HISTORY — DX: Personal history of traumatic brain injury: Z87.820

## 2016-09-22 ENCOUNTER — Ambulatory Visit (INDEPENDENT_AMBULATORY_CARE_PROVIDER_SITE_OTHER): Payer: 59 | Admitting: Physician Assistant

## 2016-09-22 VITALS — BP 122/72 | HR 88 | Temp 98.2°F | Resp 17 | Ht 64.5 in | Wt 162.0 lb

## 2016-09-22 DIAGNOSIS — S025XXB Fracture of tooth (traumatic), initial encounter for open fracture: Secondary | ICD-10-CM | POA: Diagnosis not present

## 2016-09-22 DIAGNOSIS — S060X9A Concussion with loss of consciousness of unspecified duration, initial encounter: Secondary | ICD-10-CM

## 2016-09-22 DIAGNOSIS — G44319 Acute post-traumatic headache, not intractable: Secondary | ICD-10-CM

## 2016-09-22 NOTE — Patient Instructions (Addendum)
Soft foods - like liquid - boost or nutritional supplement and milkshake - this will allow you to stay hydrated and to not have the symptoms from you not eating - not eating can give you nausea and headaches.  Ok to take Tylenol but please do not use motrin or aleve or aspirin products.  Please see Dr Carlota Raspberry on Friday (11/3 at 8:30 am) if you are feeling the same - if you are feeling worse he is here for the rest of the week please call and ask for an appt with him.  Please rest your brain - this will allow you to get better faster.  IF you received an x-ray today, you will receive an invoice from [facility]. Please contact [facility] at [phone] with questions or concerns regarding your invoice.   IF you received labwork today, you will receive an invoice from [employer]. Please contact [facility] at [phone] with questions or concerns regarding your invoice.   Our billing staff will not be able to assist you with questions regarding bills from these companies.  You will be contacted with the lab results as soon as they are available. The fastest way to get your results is to activate your My Chart account. Instructions are located on the last page of this paperwork. If you have not heard from [place] regarding the results in 2 weeks, please contact this office.     Concussion, Adult A concussion, or closed-head injury, is a brain injury caused by a direct blow to the head or by a quick and sudden movement (jolt) of the head or neck. Concussions are usually not life-threatening. Even so, the effects of a concussion can be serious. If you have had a concussion before, you are more likely to experience concussion-like symptoms after a direct blow to the head.  CAUSES  Direct blow to the head, such as from running into another player during a soccer game, being hit in a fight, or hitting your head on a hard surface.  A jolt of the head or neck  that causes the brain to move back and forth inside the skull, such as in a car crash. SIGNS AND SYMPTOMS The signs of a concussion can be hard to notice. Early on, they may be missed by you, family members, and health care providers. You may look fine but act or feel differently. Symptoms are usually temporary, but they may last for days, weeks, or even longer. Some symptoms may appear right away while others may not show up for hours or days. Every head injury is different. Symptoms include:  Mild to moderate headaches that will not go away.  A feeling of pressure inside your head.  Having more trouble than usual:  Learning or remembering things you have heard.  Answering questions.  Paying attention or concentrating.  Organizing daily tasks.  Making decisions and solving problems.  Slowness in thinking, acting or reacting, speaking, or reading.  Getting lost or being easily confused.  Feeling tired all the time or lacking energy (fatigued).  Feeling drowsy.  Sleep disturbances.  Sleeping more than usual.  Sleeping less than usual.  Trouble falling asleep.  Trouble sleeping (insomnia).  Loss of balance or feeling lightheaded or dizzy.  Nausea or vomiting.  Numbness or tingling.  Increased sensitivity to:  Sounds.  Lights.  Distractions.  Vision problems or eyes that tire easily.  Diminished sense of taste or smell.  Ringing in the ears.  Mood changes such as  feeling sad or anxious.  Becoming easily irritated or angry for little or no reason.  Lack of motivation.  Seeing or hearing things other people do not see or hear (hallucinations). DIAGNOSIS Your health care provider can usually diagnose a concussion based on a description of your injury and symptoms. He or she will ask whether you passed out (lost consciousness) and whether you are having trouble remembering events that happened right before and during your injury. Your evaluation might  include:  A brain scan to look for signs of injury to the brain. Even if the test shows no injury, you may still have a concussion.  Blood tests to be sure other problems are not present. TREATMENT  Concussions are usually treated in an emergency department, in urgent care, or at a clinic. You may need to stay in the hospital overnight for further treatment.  Tell your health care provider if you are taking any medicines, including prescription medicines, over-the-counter medicines, and natural remedies. Some medicines, such as blood thinners (anticoagulants) and aspirin, may increase the chance of complications. Also tell your health care provider whether you have had alcohol or are taking illegal drugs. This information may affect treatment.  Your health care provider will send you home with important instructions to follow.  How fast you will recover from a concussion depends on many factors. These factors include how severe your concussion is, what part of your brain was injured, your age, and how healthy you were before the concussion.  Most people with mild injuries recover fully. Recovery can take time. In general, recovery is slower in older persons. Also, persons who have had a concussion in the past or have other medical problems may find that it takes longer to recover from their current injury. HOME CARE INSTRUCTIONS General Instructions  Carefully follow the directions your health care provider gave you.  Only take over-the-counter or prescription medicines for pain, discomfort, or fever as directed by your health care provider.  Take only those medicines that your health care provider has approved.  Do not drink alcohol until your health care provider says you are well enough to do so. Alcohol and certain other drugs may slow your recovery and can put you at risk of further injury.  If it is harder than usual to remember things, write them down.  If you are easily  distracted, try to do one thing at a time. For example, do not try to watch TV while fixing dinner.  Talk with family members or close friends when making important decisions.  Keep all follow-up appointments. Repeated evaluation of your symptoms is recommended for your recovery.  Watch your symptoms and tell others to do the same. Complications sometimes occur after a concussion. Older adults with a brain injury may have a higher risk of serious complications, such as a blood clot on the brain.  Tell your teachers, school nurse, school counselor, coach, athletic trainer, or work Freight forwarder about your injury, symptoms, and restrictions. Tell them about what you can or cannot do. They should watch for:  Increased problems with attention or concentration.  Increased difficulty remembering or learning new information.  Increased time needed to complete tasks or assignments.  Increased irritability or decreased ability to cope with stress.  Increased symptoms.  Rest. Rest helps the brain to heal. Make sure you:  Get plenty of sleep at night. Avoid staying up late at night.  Keep the same bedtime hours on weekends and weekdays.  Rest during the  day. Take daytime naps or rest breaks when you feel tired.  Limit activities that require a lot of thought or concentration. These include:  Doing homework or job-related work.  Watching TV.  Working on the computer.  Avoid any situation where there is potential for another head injury (football, hockey, soccer, basketball, martial arts, downhill snow sports and horseback riding). Your condition will get worse every time you experience a concussion. You should avoid these activities until you are evaluated by the appropriate follow-up health care providers. Returning To Your Regular Activities You will need to return to your normal activities slowly, not all at once. You must give your body and brain enough time for recovery.  Do not return to  sports or other athletic activities until your health care provider tells you it is safe to do so.  Ask your health care provider when you can drive, ride a bicycle, or operate heavy machinery. Your ability to react may be slower after a brain injury. Never do these activities if you are dizzy.  Ask your health care provider about when you can return to work or school. Preventing Another Concussion It is very important to avoid another brain injury, especially before you have recovered. In rare cases, another injury can lead to permanent brain damage, brain swelling, or death. The risk of this is greatest during the first 7-10 days after a head injury. Avoid injuries by:  Wearing a seat belt when riding in a car.  Drinking alcohol only in moderation.  Wearing a helmet when biking, skiing, skateboarding, skating, or doing similar activities.  Avoiding activities that could lead to a second concussion, such as contact or recreational sports, until your health care provider says it is okay.  Taking safety measures in your home.  Remove clutter and tripping hazards from floors and stairways.  Use grab bars in bathrooms and handrails by stairs.  Place non-slip mats on floors and in bathtubs.  Improve lighting in dim areas. SEEK MEDICAL CARE IF:  You have increased problems paying attention or concentrating.  You have increased difficulty remembering or learning new information.  You need more time to complete tasks or assignments than before.  You have increased irritability or decreased ability to cope with stress.  You have more symptoms than before. Seek medical care if you have any of the following symptoms for more than 2 weeks after your injury:  Lasting (chronic) headaches.  Dizziness or balance problems.  Nausea.  Vision problems.  Increased sensitivity to noise or light.  Depression or mood swings.  Anxiety or irritability.  Memory problems.  Difficulty  concentrating or paying attention.  Sleep problems.  Feeling tired all the time. SEEK IMMEDIATE MEDICAL CARE IF:  You have severe or worsening headaches. These may be a sign of a blood clot in the brain.  You have weakness (even if only in one hand, leg, or part of the face).  You have numbness.  You have decreased coordination.  You vomit repeatedly.  You have increased sleepiness.  One pupil is larger than the other.  You have convulsions.  You have slurred speech.  You have increased confusion. This may be a sign of a blood clot in the brain.  You have increased restlessness, agitation, or irritability.  You are unable to recognize people or places.  You have neck pain.  It is difficult to wake you up.  You have unusual behavior changes.  You lose consciousness. MAKE SURE YOU:  Understand these  instructions.  Will watch your condition.  Will get help right away if you are not doing well or get worse.   This information is not intended to replace advice given to you by your health care provider. Make sure you discuss any questions you have with your health care provider.   Document Released: 01/30/2004 Document Revised: 11/30/2014 Document Reviewed: 06/01/2013 [employer] Interactive Patient Education [employer].
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
## 2016-09-25 ENCOUNTER — Telehealth: Payer: Self-pay | Admitting: Family Medicine

## 2016-09-25 ENCOUNTER — Ambulatory Visit (INDEPENDENT_AMBULATORY_CARE_PROVIDER_SITE_OTHER): Payer: 59 | Admitting: Family Medicine

## 2016-09-25 ENCOUNTER — Ambulatory Visit (HOSPITAL_COMMUNITY)
Admission: RE | Admit: 2016-09-25 | Discharge: 2016-09-25 | Disposition: A | Discharge status: Home / Self Care | Payer: 59 | Source: Ambulatory Visit | Attending: Family Medicine | Admitting: Family Medicine

## 2016-09-25 VITALS — BP 110/72 | HR 93 | Temp 98.6°F | Resp 18 | Ht 64.5 in | Wt 158.6 lb

## 2016-09-25 DIAGNOSIS — S0993XA Unspecified injury of face, initial encounter: Secondary | ICD-10-CM | POA: Diagnosis present

## 2016-09-25 DIAGNOSIS — X58XXXA Exposure to other specified factors, initial encounter: Secondary | ICD-10-CM | POA: Diagnosis not present

## 2016-09-25 DIAGNOSIS — G44309 Post-traumatic headache, unspecified, not intractable: Secondary | ICD-10-CM | POA: Diagnosis not present

## 2016-09-25 DIAGNOSIS — S060X0D Concussion without loss of consciousness, subsequent encounter: Secondary | ICD-10-CM

## 2016-09-25 DIAGNOSIS — S0993XD Unspecified injury of face, subsequent encounter: Secondary | ICD-10-CM | POA: Diagnosis not present

## 2016-09-25 DIAGNOSIS — G4700 Insomnia, unspecified: Secondary | ICD-10-CM

## 2016-09-25 DIAGNOSIS — R51 Headache: Secondary | ICD-10-CM

## 2016-09-25 DIAGNOSIS — S0242XA Fracture of alveolus of maxilla, initial encounter for closed fracture: Secondary | ICD-10-CM | POA: Diagnosis not present

## 2016-09-25 DIAGNOSIS — M2578 Osteophyte, vertebrae: Secondary | ICD-10-CM | POA: Diagnosis not present

## 2016-09-25 DIAGNOSIS — R519 Headache, unspecified: Secondary | ICD-10-CM

## 2016-09-25 MED ORDER — AMITRIPTYLINE HCL 25 MG PO TABS: 25.0000 mg | ORAL_TABLET | Freq: Every day | ORAL | 0 refills | Status: DC

## 2016-09-25 NOTE — Progress Notes (Signed)
By signing my name below, I, Brianna [name], attest that this documentation has been prepared under the direction and in the presence of Brianna Ray, MD.  Electronically Signed: Verlee [name], Medical Scribe. 09/25/16. 8:54 AM.  Subjective:    Patient ID: Brianna [name], female    DOB: 11-17-1981, 35 y.o.   MRN: [id number]  HPI Chief Complaint  Patient presents with  . Follow-up    for concussion   . FMLA Paperwork    Has paperwork that needs to be completed.     HPI Comments: Brianna [name] is a 35 y.o. female who presents to the [facility] for concussion follow-up and FMLA paperwork. Pt was seen by Brianna Amass, PA-C, 09/22/16 for a concussion she had 6 days ago. She hit her head/face, some possible slight post-traumatic amnesia. Followed by onset of HA, and symptoms of forgetfulness, and feeling more difficulty with processing/responding, some difficulty to focus at work 2 days after fall. Non focal neuro exam last visit, imaging deferred. She was placed on relative rest and out of work.  HA/Memory: Continues to feel pressure in her head that's not worsening. Pt has difficulty going to sleep secondary to head tension and twitching when sleeping. Reports memory is getting better, occasional left eye twitch, and she called her pastor President Quick. X-[name] was done at her dentisit that showed a possible brake at the maxilla, and multiple broken teeth on the upper portion of the mouth. Dentist planned for a couple of extractions on 11/9 (which could pull out a bone), and crowned. She was Rx clindamycin pill 2 days ago and she felt nausea after the medication. She's drinking water and ensure. Pt has still not been back to work. Denies double vision, blurry vision, focal weakness dizziness, light-headedness, and vomiting. Denies Hx of anxiety, depression, HAs, and nausea before her abx.   Patient Active Problem List   Diagnosis Date Noted  . Acne vulgaris 12/18/2015    Past Medical History:  Diagnosis Date  . GERD (gastroesophageal reflux disease)   . IBS (irritable bowel syndrome)   . Prediabetes    No past surgical history on file. No Known Allergies Prior to Admission medications   Medication Sig Start Date End Date Taking? Authorizing Provider  Clindamycin-Benzoyl Per, Refr, gel Apply to face once daily in the morning. 04/23/16   Historical Provider, MD  Sulfacetamide Sodium-Sulfur (CLENIA FOAMING [place]) 10-5 % EMUL Apply topically.    Historical Provider, MD  tretinoin (RETIN-A) 0.025 % gel Apply to face nightly 04/23/16   Historical Provider, MD   Social History   Social History  . Marital status: Single    Spouse name: N/A  . Number of children: N/A  . Years of education: N/A   Occupational History  . Not on file.   Social History Main Topics  . Smoking status: Never Smoker  . Smokeless tobacco: Never Used  . Alcohol use Yes  . Drug use: No  . Sexual activity: No   Other Topics Concern  . Not on file   Social History Narrative  . No narrative on file   Review of Systems  Constitutional: Positive for activity change.  HENT: Positive for dental problem.   Eyes: Positive for visual disturbance (twitch in left eye).  Gastrointestinal: Positive for nausea.  Neurological: Positive for headaches. Negative for dizziness, weakness and light-headedness.       Positive for twitch  Psychiatric/Behavioral: Positive for confusion, decreased concentration and sleep disturbance. Negative for dysphoric  mood. The patient is not nervous/anxious.    Objective:  Physical Exam  Constitutional: She appears well-developed and well-nourished. No distress.  HENT:  Head: Normocephalic and atraumatic.  Nose: Right sinus exhibits no maxillary sinus tenderness. Left sinus exhibits no maxillary sinus tenderness.  Broken teeth: 2 central incisors are broken centrally with discoloration with the left central incisors.  No maxillary sinus tenderness but  just a persistent frontal pressure on the forehead.  Eyes: Conjunctivae and EOM are normal. Pupils are equal, round, and reactive to light. Right eye exhibits no nystagmus. Left eye exhibits no nystagmus.  Neck: Neck supple.  Cardiovascular: Normal rate.   Pulmonary/Chest: Effort normal.  Neurological: She is alert. She displays a negative Romberg sign.  No pronator drift No focal neuro exam  Skin: Skin is warm and dry.  Psychiatric: She has a normal mood and affect. Her behavior is normal.  Nursing note and vitals reviewed.  BP 110/72   Pulse 93   Temp 98.6 F (37 C) (Oral)   Resp 18   Ht 5' 4.5" (1.638 m)   Wt 158 lb 9.6 oz (71.9 kg)   LMP 09/13/2016   SpO2 99%   BMI 26.80 kg/m    Ct Maxillofacial Wo Cm  Result Date: 09/25/2016 CLINICAL DATA:  Facial injury 6 days ago with concussion. Persistent frontal headache. Possible maxilla fracture. Initial encounter. EXAM: CT MAXILLOFACIAL WITHOUT CONTRAST TECHNIQUE: Multidetector CT imaging of the maxillofacial structures was performed. Multiplanar CT image reconstructions were also generated. A small metallic BB was placed on the right temple in order to reliably differentiate right from left. COMPARISON:  None. FINDINGS: Osseous: There is a minimally displaced fracture involving the buccal cortex of the alveolar process of the maxilla at the level of the left central incisor. The root of the left central incisor is minimally displaced anteriorly relative to the maxilla. No other fractures are identified. The temporomandibular joints are located. No destructive osseous lesion. Anterior osteophyte formation at C5-6 and to a lesser extent C6-7. Orbits: Unremarkable. Sinuses: The paranasal sinuses are clear. Frontal sinuses are hypoplastic. Mastoid air cells are clear. Soft tissues: Negative. Limited intracranial: Negative. IMPRESSION: Minimally displaced maxillary fracture at the level of the left central incisor. Electronically Signed   By: Logan Bores M.D.   On: 09/25/2016 13:43    Assessment & Plan:    Brianna [name] is a 35 y.o. female Post-traumatic headache, not intractable, unspecified chronicity pattern - Plan: amitriptyline (ELAVIL) 25 MG tablet, CT Maxillofacial WO CM Concussion without loss of consciousness, subsequent encounter - Plan: amitriptyline (ELAVIL) 25 MG tablet  -Overall headache is stable, focus and other symptoms have improved. No worsening of headache, no new neurologic symptoms, imaging still deferred for concussion. Symptomatic care was discussed, with continued relative rest, but RTC/ER precautions given if any worsening of headache or new neurologic symptoms.  Insomnia, unspecified type - Plan: amitriptyline (ELAVIL) 25 MG tablet  -Possibly related to both concussion as well as anxiety regarding the event. Trial of amitriptyline 25 mg initially daily at bedtime when necessary. Side effects discussed. Can also try other relaxation techniques at night as discussed in patient instructions.  Dental injury, subsequent encounter - Plan: CT Maxillofacial WO CM Face pain - Plan: CT Maxillofacial WO CM  -Followed by dentist, who did see probable fracture on imaging in his office. Plan from dentist was tooth extraction but if bone from alveolar process fracture withdrawn with that extraction, this would likely prolong time before she would be  able to have implant, and may need bone graft.   - CT was obtained to rule out other facial fractures as above, and only fracture seen was the alveolar process fracture of the maxilla that appears to be the same that dentist saw on plain imaging. Can discuss with maxillofacial surgeon to see if other treatment needed, but already under care of dentist.  Recheck 5 days.   Meds ordered this encounter  Medications  . amitriptyline (ELAVIL) 25 MG tablet    Sig: Take 1 tablet (25 mg total) by mouth at bedtime.    Dispense:  30 tablet    Refill:  0   Patient Instructions   I  will schedule a CT scan of the facial bones today to look for a fracture of the jaw or other sinus fractures based on your persistent headache and x-[name] from your dentist.  4 concussion, overall that appears to be stable if not somewhat improved. I did prescribe Elavil 1 pill at night to help with headache and difficulty sleeping. Additionally work on relaxation techniques prior to bedtime as some of the difficulty sleeping may be due to the stress from your concussion.  Follow up in next 5 days with me. Return to the clinic or go to the nearest emergency room if any of your symptoms worsen or new symptoms occur.   Concussion, Adult A concussion, or closed-head injury, is a brain injury caused by a direct blow to the head or by a quick and sudden movement (jolt) of the head or neck. Concussions are usually not life-threatening. Even so, the effects of a concussion can be serious. If you have had a concussion before, you are more likely to experience concussion-like symptoms after a direct blow to the head.  CAUSES  Direct blow to the head, such as from running into another player during a soccer game, being hit in a fight, or hitting your head on a hard surface.  A jolt of the head or neck that causes the brain to move back and forth inside the skull, such as in a car crash. SIGNS AND SYMPTOMS The signs of a concussion can be hard to notice. Early on, they may be missed by you, family members, and health care providers. You may look fine but act or feel differently. Symptoms are usually temporary, but they may last for days, weeks, or even longer. Some symptoms may appear right away while others may not show up for hours or days. Every head injury is different. Symptoms include:  Mild to moderate headaches that will not go away.  A feeling of pressure inside your head.  Having more trouble than usual:  Learning or remembering things you have heard.  Answering questions.  Paying attention  or concentrating.  Organizing daily tasks.  Making decisions and solving problems.  Slowness in thinking, acting or reacting, speaking, or reading.  Getting lost or being easily confused.  Feeling tired all the time or lacking energy (fatigued).  Feeling drowsy.  Sleep disturbances.  Sleeping more than usual.  Sleeping less than usual.  Trouble falling asleep.  Trouble sleeping (insomnia).  Loss of balance or feeling lightheaded or dizzy.  Nausea or vomiting.  Numbness or tingling.  Increased sensitivity to:  Sounds.  Lights.  Distractions.  Vision problems or eyes that tire easily.  Diminished sense of taste or smell.  Ringing in the ears.  Mood changes such as feeling sad or anxious.  Becoming easily irritated or angry for little or  no reason.  Lack of motivation.  Seeing or hearing things other people do not see or hear (hallucinations). DIAGNOSIS Your health care provider can usually diagnose a concussion based on a description of your injury and symptoms. He or she will ask whether you passed out (lost consciousness) and whether you are having trouble remembering events that happened right before and during your injury. Your evaluation might include:  A brain scan to look for signs of injury to the brain. Even if the test shows no injury, you may still have a concussion.  Blood tests to be sure other problems are not present. TREATMENT  Concussions are usually treated in an emergency department, in urgent care, or at a clinic. You may need to stay in the hospital overnight for further treatment.  Tell your health care provider if you are taking any medicines, including prescription medicines, over-the-counter medicines, and natural remedies. Some medicines, such as blood thinners (anticoagulants) and aspirin, may increase the chance of complications. Also tell your health care provider whether you have had alcohol or are taking illegal drugs. This  information may affect treatment.  Your health care provider will send you home with important instructions to follow.  How fast you will recover from a concussion depends on many factors. These factors include how severe your concussion is, what part of your brain was injured, your age, and how healthy you were before the concussion.  Most people with mild injuries recover fully. Recovery can take time. In general, recovery is slower in older persons. Also, persons who have had a concussion in the past or have other medical problems may find that it takes longer to recover from their current injury. HOME CARE INSTRUCTIONS General Instructions  Carefully follow the directions your health care provider gave you.  Only take over-the-counter or prescription medicines for pain, discomfort, or fever as directed by your health care provider.  Take only those medicines that your health care provider has approved.  Do not drink alcohol until your health care provider says you are well enough to do so. Alcohol and certain other drugs may slow your recovery and can put you at risk of further injury.  If it is harder than usual to remember things, write them down.  If you are easily distracted, try to do one thing at a time. For example, do not try to watch TV while fixing dinner.  Talk with family members or close friends when making important decisions.  Keep all follow-up appointments. Repeated evaluation of your symptoms is recommended for your recovery.  Watch your symptoms and tell others to do the same. Complications sometimes occur after a concussion. Older adults with a brain injury may have a higher risk of serious complications, such as a blood clot on the brain.  Tell your teachers, school nurse, school counselor, coach, athletic trainer, or work Freight forwarder about your injury, symptoms, and restrictions. Tell them about what you can or cannot do. They should watch for:  Increased problems  with attention or concentration.  Increased difficulty remembering or learning new information.  Increased time needed to complete tasks or assignments.  Increased irritability or decreased ability to cope with stress.  Increased symptoms.  Rest. Rest helps the brain to heal. Make sure you:  Get plenty of sleep at night. Avoid staying up late at night.  Keep the same bedtime hours on weekends and weekdays.  Rest during the day. Take daytime naps or rest breaks when you feel tired.  Limit  activities that require a lot of thought or concentration. These include:  Doing homework or job-related work.  Watching TV.  Working on the computer.  Avoid any situation where there is potential for another head injury (football, hockey, soccer, basketball, martial arts, downhill snow sports and horseback riding). Your condition will get worse every time you experience a concussion. You should avoid these activities until you are evaluated by the appropriate follow-up health care providers. Returning To Your Regular Activities You will need to return to your normal activities slowly, not all at once. You must give your body and brain enough time for recovery.  Do not return to sports or other athletic activities until your health care provider tells you it is safe to do so.  Ask your health care provider when you can drive, ride a bicycle, or operate heavy machinery. Your ability to react may be slower after a brain injury. Never do these activities if you are dizzy.  Ask your health care provider about when you can return to work or school. Preventing Another Concussion It is very important to avoid another brain injury, especially before you have recovered. In rare cases, another injury can lead to permanent brain damage, brain swelling, or death. The risk of this is greatest during the first 7-10 days after a head injury. Avoid injuries by:  Wearing a seat belt when riding in a  car.  Drinking alcohol only in moderation.  Wearing a helmet when biking, skiing, skateboarding, skating, or doing similar activities.  Avoiding activities that could lead to a second concussion, such as contact or recreational sports, until your health care provider says it is okay.  Taking safety measures in your home.  Remove clutter and tripping hazards from floors and stairways.  Use grab bars in bathrooms and handrails by stairs.  Place non-slip mats on floors and in bathtubs.  Improve lighting in dim areas. SEEK MEDICAL CARE IF:  You have increased problems paying attention or concentrating.  You have increased difficulty remembering or learning new information.  You need more time to complete tasks or assignments than before.  You have increased irritability or decreased ability to cope with stress.  You have more symptoms than before. Seek medical care if you have any of the following symptoms for more than 2 weeks after your injury:  Lasting (chronic) headaches.  Dizziness or balance problems.  Nausea.  Vision problems.  Increased sensitivity to noise or light.  Depression or mood swings.  Anxiety or irritability.  Memory problems.  Difficulty concentrating or paying attention.  Sleep problems.  Feeling tired all the time. SEEK IMMEDIATE MEDICAL CARE IF:  You have severe or worsening headaches. These may be a sign of a blood clot in the brain.  You have weakness (even if only in one hand, leg, or part of the face).  You have numbness.  You have decreased coordination.  You vomit repeatedly.  You have increased sleepiness.  One pupil is larger than the other.  You have convulsions.  You have slurred speech.  You have increased confusion. This may be a sign of a blood clot in the brain.  You have increased restlessness, agitation, or irritability.  You are unable to recognize people or places.  You have neck pain.  It is difficult  to wake you up.  You have unusual behavior changes.  You lose consciousness. MAKE SURE YOU:  Understand these instructions.  Will watch your condition.  Will get help right away if  you are not doing well or get worse.   This information is not intended to replace advice given to you by your health care provider. Make sure you discuss any questions you have with your health care provider.   Document Released: 01/30/2004 Document Revised: 11/30/2014 Document Reviewed: 06/01/2013 Elsevier Interactive Patient Education 2016 [employer].     IF you received an x-[name] today, you will receive an invoice from [facility]. Please contact [facility] at [phone] with questions or concerns regarding your invoice.   IF you received labwork today, you will receive an invoice from [employer]. Please contact [facility] at [phone] with questions or concerns regarding your invoice.   Our billing staff will not be able to assist you with questions regarding bills from these companies.  You will be contacted with the lab results as soon as they are available. The fastest way to get your results is to activate your My Chart account. Instructions are located on the last page of this paperwork. If you have not heard from [place] regarding the results in 2 weeks, please contact this office.        I personally performed the services described in this documentation, which was scribed in my presence. The recorded information has been reviewed and considered, and addended by me as needed.   Signed,   Brianna Ray, MD [facility].  09/27/16 9:26 PM

## 2016-09-25 NOTE — Patient Instructions (Addendum)
I will schedule a CT scan of the facial bones today to look for a fracture of the jaw or other sinus fractures based on your persistent headache and x-ray from your dentist.  4 concussion, overall that appears to be stable if not somewhat improved. I did prescribe Elavil 1 pill at night to help with headache and difficulty sleeping. Additionally work on relaxation techniques prior to bedtime as some of the difficulty sleeping may be due to the stress from your concussion.  Follow up in next 5 days with me. Return to the clinic or go to the nearest emergency room if any of your symptoms worsen or new symptoms occur.   Concussion, Adult A concussion, or closed-head injury, is a brain injury caused by a direct blow to the head or by a quick and sudden movement (jolt) of the head or neck. Concussions are usually not life-threatening. Even so, the effects of a concussion can be serious. If you have had a concussion before, you are more likely to experience concussion-like symptoms after a direct blow to the head.  CAUSES  Direct blow to the head, such as from running into another player during a soccer game, being hit in a fight, or hitting your head on a hard surface.  A jolt of the head or neck that causes the brain to move back and forth inside the skull, such as in a car crash. SIGNS AND SYMPTOMS The signs of a concussion can be hard to notice. Early on, they may be missed by you, family members, and health care providers. You may look fine but act or feel differently. Symptoms are usually temporary, but they may last for days, weeks, or even longer. Some symptoms may appear right away while others may not show up for hours or days. Every head injury is different. Symptoms include:  Mild to moderate headaches that will not go away.  A feeling of pressure inside your head.  Having more trouble than usual:  Learning or remembering things you have heard.  Answering questions.  Paying  attention or concentrating.  Organizing daily tasks.  Making decisions and solving problems.  Slowness in thinking, acting or reacting, speaking, or reading.  Getting lost or being easily confused.  Feeling tired all the time or lacking energy (fatigued).  Feeling drowsy.  Sleep disturbances.  Sleeping more than usual.  Sleeping less than usual.  Trouble falling asleep.  Trouble sleeping (insomnia).  Loss of balance or feeling lightheaded or dizzy.  Nausea or vomiting.  Numbness or tingling.  Increased sensitivity to:  Sounds.  Lights.  Distractions.  Vision problems or eyes that tire easily.  Diminished sense of taste or smell.  Ringing in the ears.  Mood changes such as feeling sad or anxious.  Becoming easily irritated or angry for little or no reason.  Lack of motivation.  Seeing or hearing things other people do not see or hear (hallucinations). DIAGNOSIS Your health care provider can usually diagnose a concussion based on a description of your injury and symptoms. He or she will ask whether you passed out (lost consciousness) and whether you are having trouble remembering events that happened right before and during your injury. Your evaluation might include:  A brain scan to look for signs of injury to the brain. Even if the test shows no injury, you may still have a concussion.  Blood tests to be sure other problems are not present. TREATMENT  Concussions are usually treated in an emergency department, in  urgent care, or at a clinic. You may need to stay in the hospital overnight for further treatment.  Tell your health care provider if you are taking any medicines, including prescription medicines, over-the-counter medicines, and natural remedies. Some medicines, such as blood thinners (anticoagulants) and aspirin, may increase the chance of complications. Also tell your health care provider whether you have had alcohol or are taking illegal  drugs. This information may affect treatment.  Your health care provider will send you home with important instructions to follow.  How fast you will recover from a concussion depends on many factors. These factors include how severe your concussion is, what part of your brain was injured, your age, and how healthy you were before the concussion.  Most people with mild injuries recover fully. Recovery can take time. In general, recovery is slower in older persons. Also, persons who have had a concussion in the past or have other medical problems may find that it takes longer to recover from their current injury. HOME CARE INSTRUCTIONS General Instructions  Carefully follow the directions your health care provider gave you.  Only take over-the-counter or prescription medicines for pain, discomfort, or fever as directed by your health care provider.  Take only those medicines that your health care provider has approved.  Do not drink alcohol until your health care provider says you are well enough to do so. Alcohol and certain other drugs may slow your recovery and can put you at risk of further injury.  If it is harder than usual to remember things, write them down.  If you are easily distracted, try to do one thing at a time. For example, do not try to watch TV while fixing dinner.  Talk with family members or close friends when making important decisions.  Keep all follow-up appointments. Repeated evaluation of your symptoms is recommended for your recovery.  Watch your symptoms and tell others to do the same. Complications sometimes occur after a concussion. Older adults with a brain injury may have a higher risk of serious complications, such as a blood clot on the brain.  Tell your teachers, school nurse, school counselor, coach, athletic trainer, or work Freight forwarder about your injury, symptoms, and restrictions. Tell them about what you can or cannot do. They should watch  for:  Increased problems with attention or concentration.  Increased difficulty remembering or learning new information.  Increased time needed to complete tasks or assignments.  Increased irritability or decreased ability to cope with stress.  Increased symptoms.  Rest. Rest helps the brain to heal. Make sure you:  Get plenty of sleep at night. Avoid staying up late at night.  Keep the same bedtime hours on weekends and weekdays.  Rest during the day. Take daytime naps or rest breaks when you feel tired.  Limit activities that require a lot of thought or concentration. These include:  Doing homework or job-related work.  Watching TV.  Working on the computer.  Avoid any situation where there is potential for another head injury (football, hockey, soccer, basketball, martial arts, downhill snow sports and horseback riding). Your condition will get worse every time you experience a concussion. You should avoid these activities until you are evaluated by the appropriate follow-up health care providers. Returning To Your Regular Activities You will need to return to your normal activities slowly, not all at once. You must give your body and brain enough time for recovery.  Do not return to sports or other athletic activities until  your health care provider tells you it is safe to do so.  Ask your health care provider when you can drive, ride a bicycle, or operate heavy machinery. Your ability to react may be slower after a brain injury. Never do these activities if you are dizzy.  Ask your health care provider about when you can return to work or school. Preventing Another Concussion It is very important to avoid another brain injury, especially before you have recovered. In rare cases, another injury can lead to permanent brain damage, brain swelling, or death. The risk of this is greatest during the first 7-10 days after a head injury. Avoid injuries by:  Wearing a seat belt  when riding in a car.  Drinking alcohol only in moderation.  Wearing a helmet when biking, skiing, skateboarding, skating, or doing similar activities.  Avoiding activities that could lead to a second concussion, such as contact or recreational sports, until your health care provider says it is okay.  Taking safety measures in your home.  Remove clutter and tripping hazards from floors and stairways.  Use grab bars in bathrooms and handrails by stairs.  Place non-slip mats on floors and in bathtubs.  Improve lighting in dim areas. SEEK MEDICAL CARE IF:  You have increased problems paying attention or concentrating.  You have increased difficulty remembering or learning new information.  You need more time to complete tasks or assignments than before.  You have increased irritability or decreased ability to cope with stress.  You have more symptoms than before. Seek medical care if you have any of the following symptoms for more than 2 weeks after your injury:  Lasting (chronic) headaches.  Dizziness or balance problems.  Nausea.  Vision problems.  Increased sensitivity to noise or light.  Depression or mood swings.  Anxiety or irritability.  Memory problems.  Difficulty concentrating or paying attention.  Sleep problems.  Feeling tired all the time. SEEK IMMEDIATE MEDICAL CARE IF:  You have severe or worsening headaches. These may be a sign of a blood clot in the brain.  You have weakness (even if only in one hand, leg, or part of the face).  You have numbness.  You have decreased coordination.  You vomit repeatedly.  You have increased sleepiness.  One pupil is larger than the other.  You have convulsions.  You have slurred speech.  You have increased confusion. This may be a sign of a blood clot in the brain.  You have increased restlessness, agitation, or irritability.  You are unable to recognize people or places.  You have neck  pain.  It is difficult to wake you up.  You have unusual behavior changes.  You lose consciousness. MAKE SURE YOU:  Understand these instructions.  Will watch your condition.  Will get help right away if you are not doing well or get worse.   This information is not intended to replace advice given to you by your health care provider. Make sure you discuss any questions you have with your health care provider.   Document Released: 01/30/2004 Document Revised: 11/30/2014 Document Reviewed: 06/01/2013 Elsevier Interactive Patient Education 2016 [employer].     IF you received an x-ray today, you will receive an invoice from [facility]. Please contact [facility] at [phone] with questions or concerns regarding your invoice.   IF you received labwork today, you will receive an invoice from [employer]. Please contact [facility] at [phone] with questions or concerns regarding your invoice.  Our billing staff will not be able to assist you with questions regarding bills from these companies.  You will be contacted with the lab results as soon as they are available. The fastest way to get your results is to activate your My Chart account. Instructions are located on the last page of this paperwork. If you have not heard from [place] regarding the results in 2 weeks, please contact this office.

## 2016-09-25 NOTE — Telephone Encounter (Signed)
Pt FMLA paper work in [employer]

## 2016-09-28 NOTE — Telephone Encounter (Signed)
Patient needs FMLA forms completed by Dr Carlota Raspberry for her Concussion on 09/20/16. I have completed the forms best I could based off the OV notes and highlighted the areas that need to be completed. I will place the forms in your box on 09/28/16 if you could please return them to the FMLA/Disability box at the [employer] checkout desk within 5-7 business days. Thank you!

## 2016-09-29 DIAGNOSIS — Z0271 Encounter for disability determination: Secondary | ICD-10-CM

## 2016-09-30 ENCOUNTER — Telehealth: Payer: Self-pay

## 2016-09-30 ENCOUNTER — Ambulatory Visit (INDEPENDENT_AMBULATORY_CARE_PROVIDER_SITE_OTHER): Payer: 59 | Admitting: Family Medicine

## 2016-09-30 VITALS — BP 108/62 | HR 75 | Temp 98.4°F | Resp 18 | Ht 64.5 in | Wt 163.0 lb

## 2016-09-30 DIAGNOSIS — G44309 Post-traumatic headache, unspecified, not intractable: Secondary | ICD-10-CM

## 2016-09-30 DIAGNOSIS — F0781 Postconcussional syndrome: Secondary | ICD-10-CM | POA: Diagnosis not present

## 2016-09-30 DIAGNOSIS — S060X0D Concussion without loss of consciousness, subsequent encounter: Secondary | ICD-10-CM | POA: Diagnosis not present

## 2016-09-30 NOTE — Telephone Encounter (Signed)
Patient needs an updated work note.  The note said she was supposed to return to work on the 18th but she returned to work today.    [phone]

## 2016-09-30 NOTE — Patient Instructions (Addendum)
May return to work today.  Continue to limit extended time periods in which you are constantly staring at a computer, TV, or cell phone screen.  If headaches, visual disturbances, cognitive, or motor function abnormalities occur, follow-up immediately.  Brianna [name]. [name], MSN, FNP-C [facility]    IF you received an x-ray today, you will receive an invoice from [facility]. Please contact [facility] at [phone] with questions or concerns regarding your invoice.   IF you received labwork today, you will receive an invoice from [employer]. Please contact [facility] at [phone] with questions or concerns regarding your invoice.   Our billing staff will not be able to assist you with questions regarding bills from these companies.  You will be contacted with the lab results as soon as they are available. The fastest way to get your results is to activate your My Chart account. Instructions are located on the last page of this paperwork. If you have not heard from [place] regarding the results in 2 weeks, please contact this office.   Post-Concussion Syndrome Post-concussion syndrome describes the symptoms that can occur after a head injury. These symptoms can last from weeks to months. CAUSES  It is not clear why some head injuries cause post-concussion syndrome. It can occur whether your head injury was mild or severe and whether you were wearing head protection or not.  SIGNS AND SYMPTOMS  Memory difficulties.  Dizziness.  Headaches.  Double vision or blurry vision.  Sensitivity to light.  Hearing difficulties.  Depression.  Tiredness.  Weakness.  Difficulty with concentration.  Difficulty sleeping or staying asleep.  Vomiting.  Poor balance or instability on your feet.  Slow reaction time.  Difficulty learning and remembering things you have heard. DIAGNOSIS  There is no  test to determine whether you have post-concussion syndrome. Your health care provider may order an imaging scan of your brain, such as a CT scan, to check for other problems that may be causing your symptoms (such as a severe injury inside your skull). TREATMENT  Usually, these problems disappear over time without medical care. Your health care provider may prescribe medicine to help ease your symptoms. It is important to follow up with a neurologist to evaluate your recovery and address any lingering symptoms or issues. HOME CARE INSTRUCTIONS   Take medicines only as directed by your health care provider. Do not take aspirin. Aspirin can slow blood clotting.  Sleep with your head slightly elevated to help with headaches.  Avoid any situation where there is potential for another head injury. This includes football, hockey, soccer, basketball, martial arts, downhill snow sports, and horseback riding. Your condition will get worse every time you experience a concussion. You should avoid these activities until you are evaluated by the appropriate follow-up health care providers.  Keep all follow-up visits as directed by your health care provider. This is important. SEEK MEDICAL CARE IF:  You have increased problems paying attention or concentrating.  You have increased difficulty remembering or learning new information.  You need more time to complete tasks or assignments than before.  You have increased irritability or decreased ability to cope with stress.  You have more symptoms than before. Seek medical care if you have any of the following symptoms for more than two weeks after your injury:  Lasting (chronic) headaches.  Dizziness or balance problems.  Nausea.  Vision problems.  Increased sensitivity to noise or light.  Depression  or mood swings.  Anxiety or irritability.  Memory problems.  Difficulty concentrating or paying attention.  Sleep problems.  Feeling tired  all the time. SEEK IMMEDIATE MEDICAL CARE IF:  You have confusion or unusual drowsiness.  Others find it difficult to wake you up.  You have nausea or persistent, forceful vomiting.  You feel like you are moving when you are not (vertigo). Your eyes may move rapidly back and forth.  You have convulsions or faint.  You have severe, persistent headaches that are not relieved by medicine.  You cannot use your arms or legs normally.  One of your pupils is larger than the other.  You have clear or bloody discharge from your nose or ears.  Your problems are getting worse, not better. MAKE SURE YOU:  Understand these instructions.  Will watch your condition.  Will get help right away if you are not doing well or get worse.   This information is not intended to replace advice given to you by your health care provider. Make sure you discuss any questions you have with your health care provider.   Document Released: 05/01/2002 Document Revised: 11/30/2014 Document Reviewed: 02/14/2014 Elsevier Interactive Patient Education [employer].

## 2016-09-30 NOTE — Progress Notes (Signed)
Patient ID: Brianna [name], female    DOB: Aug 10, 1981, 35 y.o.   MRN: [id number]  PCP: No PCP Per Patient  Chief Complaint  Patient presents with  . Follow-up    Possible consussion    Subjective:   HPI 35 year old female presents for evaluation of post concussion injury which initially seen and evaluated in office on 09/22/16 and subsequently seen for follow-up on 09/25/2016.  Patient was at a club on 09/19/16 and slipped in a puddle liquid which resulted in a fall  causing an head injury in which she reported a semi-loss of consciousness with headache, amnesia, and breakage of her two front teeth.  She was seen clinic on 09/25/2016, CT of Maxillofacial revealed a displaced maxillary fracture. During that visit she was continuing to experience a cognitive delays and significant headaches. She was prescribed amitriptyline at that time for headache management.   Today she reports overall improvement of symptoms and notices daily she seems to feel better. Reports that her last headache was 6 days ago. She has not taken the Amitriptyline, as she reviewed the side effects of the medication and decided not to take it. Her last headache occurred bilaterally across her forehead. Resolved without medication. Denies any dizziness or instability of gait. Reports continually bothered by flashes of light. Feels more irritable with long conversation. Motor movements are good still feels her thoughts are slower than previously. Last headache was 2 days ago and resolved with rest. Reports some soreness around the nose area related to fall but tolerable as she doesn't want to take pain medication. She has her first visit with the oral surgeon in 1 week and reports that during her consultation she was advised that entire process of her oral repairs may take up to 4 months. Today she feels ready and capable of returning back to work.   Social History   Social History  . Marital status: Single    Spouse name:  N/A  . Number of children: N/A  . Years of education: N/A   Occupational History  . Not on file.   Social History Main Topics  . Smoking status: Never Smoker  . Smokeless tobacco: Never Used  . Alcohol use Yes  . Drug use: No  . Sexual activity: No   Other Topics Concern  . Not on file   Social History Narrative  . No narrative on file    Family History  Problem Relation Age of Onset  . Heart disease Maternal Grandmother   . Stroke Maternal Grandfather   . Diabetes Maternal Grandfather   . Hyperlipidemia Mother   . Diabetes Maternal Uncle   . Heart attack Other   . Obesity Other   . COPD Other   . Asthma Other     Review of Systems See HPI Patient Active Problem List   Diagnosis Date Noted  . Acne vulgaris 12/18/2015     Prior to Admission medications   Medication Sig Start Date End Date Taking? Authorizing Provider  amitriptyline (ELAVIL) 25 MG tablet Take 1 tablet (25 mg total) by mouth at bedtime. 09/25/16  Yes Wendie Agreste, MD  Clindamycin-Benzoyl Per, Refr, gel Apply to face once daily in the morning. 04/23/16  Yes Historical Provider, MD  Sulfacetamide Sodium-Sulfur (CLENIA FOAMING [place]) 10-5 % EMUL Apply topically.   Yes Historical Provider, MD  tretinoin (RETIN-A) 0.025 % gel Apply to face nightly 04/23/16  Yes Historical Provider, MD  No Known Allergies   Objective:  Physical  Exam  Constitutional: She appears well-developed and well-nourished.  HENT:  Head: Normocephalic and atraumatic. Head is without right periorbital erythema and without left periorbital erythema.  Right Ear: External ear normal.  Left Ear: External ear normal.  Nose: Sinus tenderness present. No nasal deformity or septal deviation. No epistaxis.    Neurological: She is alert. She has normal strength. No cranial nerve deficit or sensory deficit. She displays a negative Romberg sign. Coordination normal. GCS eye subscore is 4. GCS verbal subscore is 5. GCS motor subscore is 6.    Skin: Skin is warm and dry.  Psychiatric: She has a normal mood and affect. Her behavior is normal. Judgment and thought content normal.   Vitals:   09/30/16 0811  BP: 108/62  Pulse: 75  Resp: 18  Temp: 98.4 F (36.9 C)     Assessment & Plan:  1. Post concussion syndrome, stable Plan: -You may return to work today. -Continue to limit extended time periods in which you are constantly staring at a computer, TV, or cell phone screen. -If headaches, visual disturbances, cognitive, or motor function abnormalities occur, follow-up immediately.  Carroll Sage. [name], MSN, FNP-C [facility]

## 2016-09-30 NOTE — Telephone Encounter (Signed)
Per patient request, printed new note for her.  She is on her way to pick up now.

## 2016-10-02 NOTE — Telephone Encounter (Signed)
I completed her paperwork.  NOted she was seen by Molli Barrows 2 days ago and returned to work.  Follow up with me sometime next week to determine how she is tolerating RTW, and to check on status of dental injury.  Sooner if any worsening sx's at work.

## 2016-10-05 NOTE — Telephone Encounter (Signed)
Paperwork scanned and faxed to company on 10/05/16

## 2016-10-07 ENCOUNTER — Telehealth: Payer: Self-pay | Admitting: Family Medicine

## 2016-10-07 NOTE — Telephone Encounter (Signed)
Pt calling stating that we had already sent FMLA paperwork to job but it needs to say how much computer usage she can be on computer it has to be in detailed for computer restrictions

## 2016-10-07 NOTE — Telephone Encounter (Signed)
Paperwork states that patient should have limited time at a computer, tv or phone screen for work due to concussion.

## 2016-11-23 HISTORY — PX: BREAST LUMPECTOMY: SHX2

## 2017-02-03 DIAGNOSIS — R87619 Unspecified abnormal cytological findings in specimens from cervix uteri: Secondary | ICD-10-CM | POA: Diagnosis not present

## 2017-02-03 DIAGNOSIS — N870 Mild cervical dysplasia: Secondary | ICD-10-CM | POA: Diagnosis not present

## 2017-02-04 DIAGNOSIS — R87619 Unspecified abnormal cytological findings in specimens from cervix uteri: Secondary | ICD-10-CM | POA: Diagnosis not present

## 2017-05-13 DIAGNOSIS — L819 Disorder of pigmentation, unspecified: Secondary | ICD-10-CM | POA: Diagnosis not present

## 2017-05-13 DIAGNOSIS — L731 Pseudofolliculitis barbae: Secondary | ICD-10-CM | POA: Diagnosis not present

## 2017-05-13 DIAGNOSIS — L700 Acne vulgaris: Secondary | ICD-10-CM | POA: Diagnosis not present

## 2017-05-25 ENCOUNTER — Ambulatory Visit (INDEPENDENT_AMBULATORY_CARE_PROVIDER_SITE_OTHER): Payer: 59 | Admitting: Family Medicine

## 2017-05-25 ENCOUNTER — Encounter: Payer: Self-pay | Admitting: Family Medicine

## 2017-05-25 VITALS — BP 106/70 | HR 61 | Resp 12 | Ht 64.5 in | Wt 166.5 lb

## 2017-05-25 DIAGNOSIS — K581 Irritable bowel syndrome with constipation: Secondary | ICD-10-CM | POA: Diagnosis not present

## 2017-05-25 DIAGNOSIS — N63 Unspecified lump in unspecified breast: Secondary | ICD-10-CM | POA: Diagnosis not present

## 2017-05-25 DIAGNOSIS — R7303 Prediabetes: Secondary | ICD-10-CM

## 2017-05-25 DIAGNOSIS — K589 Irritable bowel syndrome without diarrhea: Secondary | ICD-10-CM | POA: Insufficient documentation

## 2017-05-25 DIAGNOSIS — N6019 Diffuse cystic mastopathy of unspecified breast: Secondary | ICD-10-CM

## 2017-05-25 LAB — HEMOGLOBIN A1C: Hgb A1c MFr Bld: 5.7 % (ref 4.6–6.5)

## 2017-05-25 LAB — BASIC METABOLIC PANEL
BUN: 11 mg/dL (ref 6–23)
CO2: 27 meq/L (ref 19–32)
Calcium: 9.4 mg/dL (ref 8.4–10.5)
Chloride: 102 mEq/L (ref 96–112)
Creatinine, Ser: 0.96 mg/dL (ref 0.40–1.20)
GFR: 83.92 mL/min (ref >60.00)
GLUCOSE: 93 mg/dL (ref 70–99)
POTASSIUM: 4.3 meq/L (ref 3.5–5.1)
SODIUM: 135 meq/L (ref 135–145)

## 2017-05-25 LAB — TSH: TSH: 1.33 u[IU]/mL (ref 0.35–4.50)

## 2017-05-25 NOTE — Patient Instructions (Signed)
A few things to remember from today's visit:   Breast mass in female - Plan: MM Digital Diagnostic Bilat  Irritable bowel syndrome with constipation - Plan: TSH  Prediabetes - Plan: Basic metabolic panel, Hemoglobin A1c  Fibrocystic breast disease (FCBD) in female, unspecified laterality  We will schedule follow up based on lab results.  For constipation consider MiraLAX every 2 days as needed as well as Dulcolax 5 mg once daily as needed. Increase fiber and fluid intake.   Please be sure medication list is accurate. If a new problem present, please set up appointment sooner than planned today.

## 2017-05-25 NOTE — Progress Notes (Signed)
HPI:   [name] is a 36 y.o. female, who is here today to establish care.  Former PCP: N/A Last preventive routine visit: 01/2017, Dr. Garwin Brothers (gynecologist).  Chronic medical problems: IBS-C, vulgar acne, irregular menses, prediabetes among some.   Concerns today:    -Recently she was placed on Spironolactone to treat acne. She states that after she started medication she felt a great deal of energy, so she wonders if "all the problems" she has had for 5-6 years are related to thyroid disease: Fatigue, hearing loss (intermittent fullness ear sensation), and GI problems among some. According to patient, she has not had thyroid testing done in the past.  -Constipation: She usually has bowel movements 1-2 times per week. + Rectal tenesmus, which resolved when she changed her diet. She has not identified exacerbating factors.  Last bowel movement about 3 days ago.  Denies abdominal pain, nausea, vomiting, changes in bowel habits, blood in stool or melena. She tries to eat a good amount of fiber and fluids, she has not tried OTC medications.   -Right breast "knot" noted about 3 months ago, constant, unchanged. She checks her breast regularly and this lesion she palpated is new, not tenderness and no nipple discharged.  FHx for breast cancer negative.  She tries to follow a healthy diet and exercise but she's not consistent. She was diagnosed with prediabetes 2-3 years ago, she was on Metformin until about a year ago. She is not aware of any history of PCOS.  She discontinue OCPs September 2017 because she thought it was causing GI side effects, since then she has had irregular menstrual periods.  Currently she is not on birth control, she states that she knows she is not pregnant.  She is having some mild cramping similar to those she had before her menses.   Review of Systems  Constitutional: Negative for activity change, appetite change, fatigue, fever and  unexpected weight change.  HENT: Negative for mouth sores, nosebleeds and trouble swallowing.   Eyes: Negative for redness and visual disturbance.  Respiratory: Negative for cough, shortness of breath and wheezing.   Cardiovascular: Negative for chest pain, palpitations and leg swelling.  Gastrointestinal: Positive for constipation. Negative for abdominal pain, blood in stool, nausea and vomiting.       Negative for changes in bowel habits.  Endocrine: Negative for cold intolerance, heat intolerance, polydipsia, polyphagia and polyuria.  Genitourinary: Positive for menstrual problem. Negative for decreased urine volume, dysuria and hematuria.  Musculoskeletal: Negative for gait problem and myalgias.  Skin: Negative for rash.  Allergic/Immunologic: Positive for environmental allergies.  Neurological: Negative for syncope, weakness and headaches.  Psychiatric/Behavioral: Negative for confusion. The patient is nervous/anxious.       Current Outpatient Prescriptions on File Prior to Visit  Medication Sig Dispense Refill  . Clindamycin-Benzoyl Per, Refr, gel Apply to face once daily in the morning.    . Sulfacetamide Sodium-Sulfur (CLENIA FOAMING [place]) 10-5 % EMUL Apply topically.    . tretinoin (RETIN-A) 0.025 % gel Apply to face nightly     No current facility-administered medications on file prior to visit.      Past Medical History:  Diagnosis Date  . GERD (gastroesophageal reflux disease)   . IBS (irritable bowel syndrome)   . Prediabetes    No Known Allergies  Family History  Problem Relation Age of Onset  . Heart disease Maternal Grandmother   . Stroke Maternal Grandfather   . Diabetes Maternal Grandfather   .  Hyperlipidemia Mother   . Diabetes Maternal Uncle   . Heart attack Other   . Obesity Other   . COPD Other   . Asthma Other     Social History   Social History  . Marital status: Married    Spouse name: N/A  . Number of children: N/A  . Years of  education: N/A   Social History Main Topics  . Smoking status: Never Smoker  . Smokeless tobacco: Never Used  . Alcohol use Yes  . Drug use: No  . Sexual activity: No   Other Topics Concern  . None   Social History Narrative  . None    Vitals:   05/25/17 1353  BP: 106/70  Pulse: 61  Resp: 12   O2 sat at RA 98% Body mass index is 28.14 kg/m.   Physical Exam  Nursing note and vitals reviewed. Constitutional: She is oriented to person, place, and time. She appears well-developed. No distress.  HENT:  Head: Normocephalic and atraumatic.  Mouth/Throat: Oropharynx is clear and moist and mucous membranes are normal.  Eyes: Conjunctivae and EOM are normal. Pupils are equal, round, and reactive to light.  Neck: No tracheal deviation present. Thyromegaly (mild) present. No thyroid mass present.  Cardiovascular: Normal rate and regular rhythm.   No murmur heard. Pulses:      Dorsalis pedis pulses are 2+ on the right side, and 2+ on the left side.  Respiratory: Effort normal and breath sounds normal. No respiratory distress.  GI: Soft. She exhibits no mass. There is no hepatomegaly. There is no tenderness.  Genitourinary: No breast swelling or tenderness.  Genitourinary Comments: Inverted nipples bilaterally. I don't appreciate skin changes or nipple discharge. Fibrocystic changes in the upper outer quadrant, bilateral. Nodular lesion of concern in the right breast, between 9 and 10 O'clock, mobile, nontender.  Musculoskeletal: She exhibits no edema or tenderness.  Lymphadenopathy:    She has no cervical adenopathy.    She has no axillary adenopathy.       Right: No supraclavicular adenopathy present.       Left: No supraclavicular adenopathy present.  Neurological: She is alert and oriented to person, place, and time. She has normal strength. Coordination and gait normal.  Skin: Skin is warm. No rash noted. No erythema.  Psychiatric: Her mood appears anxious.  Well groomed,  good eye contact.     ASSESSMENT AND PLAN:   Ms. Brianna [name] was seen today for establish care.  Diagnoses and all orders for this visit:  Lab Results  Component Value Date   TSH 1.33 05/25/2017   Lab Results  Component Value Date   CREATININE 0.96 05/25/2017   BUN 11 05/25/2017   NA 135 05/25/2017   K 4.3 05/25/2017   CL 102 05/25/2017   CO2 27 05/25/2017   Lab Results  Component Value Date   HGBA1C 5.7 05/25/2017    Breast mass in female  Possible etiologies discussed: Fibrocystic changes most likely. MDx mammogram will be arranged.  -     MM Digital Diagnostic Bilat; Future  Irritable bowel syndrome with constipation  We discussed treatment options, OTC and prescription. She prefers to try OTC Miralax and Dulcolax as needed. Linzess and Amitiza are prescriptions options we can consider if not response to OTC meds.  -     TSH  Prediabetes  Primary prevention through a consistent healthy diet and regular exercise recommended. Further recommendations will be given according to lab results.   -  Basic metabolic panel -     Hemoglobin A1c  Fibrocystic breast disease (FCBD) in female, unspecified laterality  Not having tenderness. Educated about Dx and treatment options. Limit caffeine intake. Could be aggravated by Spironolactone, could cause breast tenderness.     Strongly encouraged to start birth control. She just void, cannot collect urine sample but she is positive she is not pregnant.Recommended doing home pregnancy test before going for mammogram.   [name]. [name], MD  [facility]. [place] office.

## 2017-06-07 ENCOUNTER — Telehealth: Payer: Self-pay | Admitting: Family Medicine

## 2017-06-07 NOTE — Telephone Encounter (Signed)
° ° °  Pt would like a call back about her labs and who she should schedule her Mamo with

## 2017-06-07 NOTE — Telephone Encounter (Signed)
Left detailed voicemail per DPR with information below.

## 2017-06-18 ENCOUNTER — Other Ambulatory Visit: Payer: Self-pay | Admitting: Family Medicine

## 2017-06-18 DIAGNOSIS — N63 Unspecified lump in unspecified breast: Secondary | ICD-10-CM

## 2017-07-09 ENCOUNTER — Ambulatory Visit
Admission: RE | Admit: 2017-07-09 | Discharge: 2017-07-09 | Disposition: A | Discharge status: Home / Self Care | Payer: 59 | Source: Ambulatory Visit | Attending: Family Medicine | Admitting: Family Medicine

## 2017-07-09 ENCOUNTER — Other Ambulatory Visit: Payer: Self-pay | Admitting: Family Medicine

## 2017-07-09 DIAGNOSIS — N63 Unspecified lump in unspecified breast: Secondary | ICD-10-CM

## 2017-07-09 DIAGNOSIS — N6310 Unspecified lump in the right breast, unspecified quadrant: Secondary | ICD-10-CM

## 2017-07-09 DIAGNOSIS — R922 Inconclusive mammogram: Secondary | ICD-10-CM | POA: Diagnosis not present

## 2017-07-15 ENCOUNTER — Ambulatory Visit
Admission: RE | Admit: 2017-07-15 | Discharge: 2017-07-15 | Disposition: A | Discharge status: Home / Self Care | Payer: 59 | Source: Ambulatory Visit | Attending: Family Medicine | Admitting: Family Medicine

## 2017-07-15 ENCOUNTER — Other Ambulatory Visit: Payer: Self-pay | Admitting: Family Medicine

## 2017-07-15 ENCOUNTER — Encounter (INDEPENDENT_AMBULATORY_CARE_PROVIDER_SITE_OTHER): Payer: Self-pay

## 2017-07-15 DIAGNOSIS — C50411 Malignant neoplasm of upper-outer quadrant of right female breast: Secondary | ICD-10-CM | POA: Diagnosis not present

## 2017-07-15 DIAGNOSIS — N6311 Unspecified lump in the right breast, upper outer quadrant: Secondary | ICD-10-CM | POA: Diagnosis not present

## 2017-07-15 DIAGNOSIS — R590 Localized enlarged lymph nodes: Secondary | ICD-10-CM | POA: Diagnosis not present

## 2017-07-15 DIAGNOSIS — N6310 Unspecified lump in the right breast, unspecified quadrant: Secondary | ICD-10-CM

## 2017-07-19 ENCOUNTER — Ambulatory Visit: Payer: Self-pay | Admitting: Surgery

## 2017-07-19 DIAGNOSIS — C50911 Malignant neoplasm of unspecified site of right female breast: Secondary | ICD-10-CM

## 2017-07-19 NOTE — H&P (Signed)
History of Present Illness Brianna [name]. Brianna Niehaus MD; 07/19/2017 1:34 PM) The patient is a 36 year old female who presents with breast cancer. PCP - Brianna [name] GYN - Brianna [name]  Reason for consultation: New diagnosis right breast cancer This is a healthy 36 year old female who palpated a vague mass in the right upper outer quadrant of her breast near her axilla. She initially palpated this in March of this year. She brought this to the attention of her primary care physician in July. She was then referred for mammogram followed by ultrasound. 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 A nearby lymph node was mildly abnormal with thickened cortex. The lymph node was also biopsied and was benign. Prognostic pain was pending. She presents now for her initial surgical evaluation.  Menarche age 89 First pregnancy age 26. The patient has had 3 ectopic pregnancies and no live births Breastfeeding - No Hormones - she was on oral contraceptives for 7 years Last menstrual period 06/29/17 FH - negative  Recent abnormal PAP smear with some atypical cells   CLINICAL DATA: 36 year old female presenting for evaluation of a palpable mass in the right breast. She states she initially felt this around March of this year, but has not noted a difference since first identifying it.  EXAM: 2D DIGITAL DIAGNOSTIC BILATERAL MAMMOGRAM WITH CAD AND ADJUNCT TOMO  RIGHT BREAST ULTRASOUND  COMPARISON: None.  ACR Breast Density Category d: The breast tissue is extremely dense, which lowers the sensitivity of mammography.  FINDINGS: A BB has been placed on the upper-outer quadrant of the right breast indicating the palpable site of concern. On the spot compression tomosynthesis exaggerated right CC view there is an irregular mass with spiculated margins measuring approximately 1.4 cm. No other suspicious calcifications, masses or areas of distortion are seen in the  bilateral breasts.  Mammographic images were processed with CAD.  Physical exam of the palpable site in the upper-outer quadrant of the right breast demonstrates a subtle firm mass at approximately 1030, 8 cm from the nipple.  Ultrasound targeted to the palpable site at 10:30, 8 cm from the nipple in the right breast demonstrates an irregular hypoechoic mass with indistinct margins measuring 1.1 x 1.0 x 1.0 cm. Approximately 2.5 cm away from the mass, in the lower axilla is an abnormal appearing lymph node with a thickened neck a nodular cortex.  IMPRESSION: 1. There is an indeterminate mass at the palpable site in the right breast at 10:30. This could represent either an abnormal lymph node or a suspicious breast mass.  2. There is a suspicious lymph node in the right axilla.  3. No mammographic evidence of left breast malignancy.  RECOMMENDATION: Ultrasound-guided biopsy is recommended for the right breast mass and the right axillary lymph node. This has been scheduled for 07/15/2017 at 3:45 p.m.  I have discussed the findings and recommendations with the patient. Results were also provided in writing at the conclusion of the visit. If applicable, a reminder letter will be sent to the patient regarding the next appointment.  BI-RADS CATEGORY 4: Suspicious.   Electronically Signed By: Brianna [name] M.D. On: 07/09/2017 14:31  CLINICAL DATA: 36 year old female with right breast mass in mid right axillary lymph node.  EXAM: ULTRASOUND GUIDED RIGHT BREAST CORE NEEDLE BIOPSY  COMPARISON: Previous exam(s).  FINDINGS: I met with the patient and we discussed the procedure of ultrasound-guided biopsy, including benefits and alternatives. We discussed the high likelihood of a successful procedure. We  discussed the risks of the procedure, including infection, bleeding, tissue injury, clip migration, and inadequate sampling. Informed written consent was given.  The usual time-out protocol was performed immediately prior to the procedure.  Lesion quadrant: Upper-outer quadrant.  Using sterile technique and 1% Lidocaine as local anesthetic, under direct ultrasound visualization, a 14 gauge spring-loaded device was used to perform biopsy of a right breast mass at the 10 o'clock position 8 cm from the nipple. Using a lateral approach. At the conclusion of the procedure a ribbon shaped tissue marker clip was deployed into the biopsy cavity. Follow up 2 view mammogram was performed and dictated separately.  IMPRESSION: Ultrasound guided biopsy of a right breast mass. No apparent complications.  Electronically Signed: By: Brianna [name] M.D. On: 07/15/2017 16:57  CLINICAL DATA: 36 year old female with a right breast mass and indeterminate right axillary lymph node.  EXAM: ULTRASOUND GUIDED CORE NEEDLE BIOPSY OF A RIGHT AXILLARY NODE  COMPARISON: Previous exam(s).  FINDINGS: I met with the patient and we discussed the procedure of ultrasound-guided biopsy, including benefits and alternatives. We discussed the high likelihood of a successful procedure. We discussed the risks of the procedure, including infection, bleeding, tissue injury, clip migration, and inadequate sampling. Informed written consent was given. The usual time-out protocol was performed immediately prior to the procedure.  Using sterile technique and 1% Lidocaine as local anesthetic, under direct ultrasound visualization, a 14 gauge spring-loaded device was used to perform biopsy of a right axillary using a lateral approach. At the conclusion of the procedure a HydroMARK tissue marker clip was deployed into the biopsy cavity. Follow up 2 view mammogram was performed and dictated separately.  IMPRESSION: Ultrasound guided biopsy of a right axillary lymph node. No apparent complications.  Electronically Signed: By: Brianna [name] M.D. On: 07/15/2017  16:57  Diagnosis 1. Breast, right, needle core biopsy, UOQ - INVASIVE DUCTAL CARCINOMA, SEE COMMENT. 2. Lymph node, needle/core biopsy, right and axilla - BENIGN LYMPH NODE. - NO METASTATIC MALIGNANCY. Microscopic Comment 1. The carcinoma appears grade 3. Prognostic markers will be ordered. Dr. Lyndon Code has reviewed the case. The case was called to [facility] on 07/16/2017. Vicente Males MD Pathologist, Electronic Signature (Case signed 07/16/2017) Specimen Gross and Clinical Information Specimen Comment 1. TIF: 4:35 PM; extracted < 1 min; right breast mass 2. Indeterminate right axillary lymph node Specimen(s) Obtained: 1. Breast, right, needle core biopsy, UOQ 2. Lymph node, needle/core biopsy, right and axilla Specimen Clinical Information 1. Right breast mass 2. Indeterminate right axillary lymph node    Past Surgical History Malachy Moan, RMA; 07/19/2017 11:18 AM) Breast Biopsy Right. Colon Polyp Removal - Colonoscopy Oral Surgery  Diagnostic Studies History Malachy Moan, [place]; 07/19/2017 11:18 AM) Colonoscopy 5-10 years ago Mammogram within last year Pap Smear 1-5 years ago  Medication History Malachy Moan, RMA; 07/19/2017 11:20 AM) Clindamycin Phos-Benzoyl Perox (1.2-5% Gel, External) Active. Sulfacetamide Sodium-Sulfur (10-5% Emulsion, External) Active. Spironolactone (100MG Tablet, Oral) Active. Tretinoin (Emollient) (0.02% Cream, External) Active. Medications Reconciled  Social History Malachy Moan, [place]; 07/19/2017 11:18 AM) Alcohol use Occasional alcohol use. No caffeine use No drug use Tobacco use Never smoker.  Family History Malachy Moan, [place]; 07/19/2017 11:18 AM) Alcohol Abuse Family Members In General. Diabetes Mellitus Family Members In General. Heart Disease Family Members In General. Heart disease in female family member before age 44 Respiratory Condition Family Members In  General.  Pregnancy / Birth History Malachy Moan, [place]; 07/19/2017 11:18 AM) Age at menarche 31 years. Contraceptive History Oral  contraceptives. Gravida 3 Maternal age 96-20 Para 0 Regular periods  Other Problems Christianne Dolin, [place]; 07/19/2017 11:18 AM) Breast Cancer Gastroesophageal Reflux Disease Lump In Breast     Review of Systems Christianne Dolin RMA; 07/19/2017 11:18 AM) General Not Present- Appetite Loss, Chills, Fatigue, Fever, Night Sweats, Weight Gain and Weight Loss. Skin Not Present- Change in Wart/Mole, Dryness, Hives, Jaundice, New Lesions, Non-Healing Wounds, Rash and Ulcer. Respiratory Not Present- Bloody sputum, Chronic Cough, Difficulty Breathing, Snoring and Wheezing. Breast Present- Breast Mass. Not Present- Breast Pain, Nipple Discharge and Skin Changes. Cardiovascular Not Present- Chest Pain, Difficulty Breathing Lying Down, Leg Cramps, Palpitations, Rapid Heart Rate, Shortness of Breath and Swelling of Extremities. Gastrointestinal Not Present- Abdominal Pain, Bloating, Bloody Stool, Change in Bowel Habits, Chronic diarrhea, Constipation, Difficulty Swallowing, Excessive gas, Gets full quickly at meals, Hemorrhoids, Indigestion, Nausea, Rectal Pain and Vomiting. Female Genitourinary Not Present- Frequency, Nocturia, Painful Urination, Pelvic Pain and Urgency. Musculoskeletal Not Present- Back Pain, Joint Pain, Joint Stiffness, Muscle Pain, Muscle Weakness and Swelling of Extremities. Neurological Not Present- Decreased Memory, Fainting, Headaches, Numbness, Seizures, Tingling, Tremor, Trouble walking and Weakness. Psychiatric Not Present- Anxiety, Bipolar, Change in Sleep Pattern, Depression, Fearful and Frequent crying. Endocrine Not Present- Cold Intolerance, Excessive Hunger, Hair Changes, Heat Intolerance, Hot flashes and New Diabetes. Hematology Not Present- Blood Thinners, Easy Bruising, Excessive bleeding, Gland problems, HIV and Persistent  Infections.  Vitals Christianne Dolin RMA; 07/19/2017 11:20 AM) 07/19/2017 11:20 AM Weight: 165.4 lb Height: 63in Body Surface Area: 1.78 m Body Mass Index: 29.3 kg/m  Temp.: 97.58F  Pulse: 96 (Regular)  BP: 110/80 (Sitting, Left Arm, Standard)      Physical Exam Molli Hazard K. Tyrianna Lightle MD; 07/19/2017 1:35 PM)  The physical exam findings are as follows: Note:WDWN in NAD Eyes: Pupils equal, round; sclera anicteric HENT: Oral mucosa moist; good dentition Neck: No masses palpated, no thyromegaly Lungs: CTA bilaterally; normal respiratory effort Breasts: symmetric; bilateral chronic nipple retraction; no left breast masses; vague firmness near right axilla - no palpable lymph nodes CV: Regular rate and rhythm; no murmurs; extremities well-perfused with no edema Abd: +bowel sounds, soft, non-tender, no palpable organomegaly; no palpable hernias Skin: Warm, dry; no sign of jaundice Psychiatric - alert and oriented x 4; calm mood and affect    Assessment & Plan Molli Hazard K. 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 Routine. Referred to Radiation Oncology, for evaluation and follow up (Radiation Oncology). Routine. Referred to Oncology, for evaluation and follow up (Oncology). Routine. Note:I spent approximately 1 hour with the patient and her husband answering their questions and discussing her disease and the recommended treatment pathways. At her young age, we will refer her to genetics for testing. We will also refer her to oncology for possible hormonal treatment prior to surgery if genetic testing will delay any treatment. We discussed the different surgical options including breast conserving therapy with right radioactive seed localized lumpectomy as well as right axillary sentinel lymph node biopsy. We will discuss the  lymph node further with radiology. If they feel that the biopsy is not concordant with the appearance of the lymph node, we may need to do a targeted lymph node dissection at the time of surgery.  We discussed possible surgical options of mastectomy with immediate reconstruction or without reconstruction. We also discussed the possibility of contralateral prophylactic mastectomy if her genetic tests come back  positive. The patient will go ahead and meet with genetics and we will discuss this further after that appointment and after breast cancer conference this week.  Brianna [name]. Georgette Dover, MD, [facility]  General/ Trauma Surgery  07/19/2017 1:38 PM

## 2017-07-21 ENCOUNTER — Telehealth: Payer: Self-pay | Admitting: Hematology and Oncology

## 2017-07-21 NOTE — Telephone Encounter (Signed)
Appt has been scheduled for the pt to see Dr. Lindi Adie on 8/30 at 145pm. Pt aware to arrive 30 minutes early. Location given.

## 2017-07-22 ENCOUNTER — Telehealth: Payer: Self-pay | Admitting: *Deleted

## 2017-07-22 ENCOUNTER — Other Ambulatory Visit: Payer: 59

## 2017-07-22 ENCOUNTER — Ambulatory Visit (HOSPITAL_BASED_OUTPATIENT_CLINIC_OR_DEPARTMENT_OTHER): Payer: 59 | Admitting: Hematology and Oncology

## 2017-07-22 ENCOUNTER — Ambulatory Visit (HOSPITAL_BASED_OUTPATIENT_CLINIC_OR_DEPARTMENT_OTHER): Payer: 59 | Admitting: Genetics

## 2017-07-22 ENCOUNTER — Encounter: Payer: Self-pay | Admitting: Hematology and Oncology

## 2017-07-22 ENCOUNTER — Encounter: Payer: Self-pay | Admitting: *Deleted

## 2017-07-22 ENCOUNTER — Encounter: Payer: Self-pay | Admitting: Genetics

## 2017-07-22 DIAGNOSIS — Z170 Estrogen receptor positive status [ER+]: Secondary | ICD-10-CM

## 2017-07-22 DIAGNOSIS — C50911 Malignant neoplasm of unspecified site of right female breast: Secondary | ICD-10-CM

## 2017-07-22 DIAGNOSIS — C50411 Malignant neoplasm of upper-outer quadrant of right female breast: Secondary | ICD-10-CM | POA: Diagnosis not present

## 2017-07-22 DIAGNOSIS — Z803 Family history of malignant neoplasm of breast: Secondary | ICD-10-CM | POA: Diagnosis not present

## 2017-07-22 NOTE — Progress Notes (Signed)
REFERRING PROVIDER: Nicholas Lose, MD [place], [place] [address]  PRIMARY PROVIDER:  [name], [name], MD  PRIMARY REASON FOR VISIT:  1. Malignant neoplasm of right breast in female, estrogen receptor positive, unspecified site of breast ([place])      HISTORY OF PRESENT ILLNESS:   Brianna [name], a 36 y.o. female, was seen for a [place] cancer genetics consultation at the request of Dr. Lindi Adie due to a personal history of cancer.  Brianna [name] presents to clinic today to discuss the possibility of a hereditary predisposition to cancer, genetic testing, and to further clarify her future cancer risks, as well as potential cancer risks for family members.   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 Her treatment is pending. She plans neoadjuvant anti-estrogen, lumpectomy, adjuvant chemotherapy and radiation. However, she states that the results of her genetic testing may influence her surgical decisions.   Brianna [name] also reports a colonoscopy approximately 6 years ago with [facility] due to symptoms and testing positive for H. Pylori. She reports that she was told she had three polyps removed at that time. She reports an abnormal pap test in October 2017, but a normal follow-up pap since then.    Malignant neoplasm of upper-outer quadrant of right breast in female, estrogen receptor positive ([place])   07/15/2017 Initial Diagnosis    Patient palpated a week mass in the right upper outer quadrant breast near the axilla in March 2018 mammogram revealed irregular spiculated mass 1.4 cm, by ultrasound measured 1.1 cm with a suspicious right axillary lymph node; biopsy revealed IDC grade 3, lymph node benign, ER 90%, PR 0%, Ki-67 40%, HER-2 positive ratio 2.6, T1c N0 stage IA, AJCC 8 clinical stage       Past Medical History:  Diagnosis Date  . GERD (gastroesophageal reflux  disease)   . IBS (irritable bowel syndrome)   . Prediabetes     No past surgical history on file.  Social History   Social History  . Marital status: Married    Spouse name: N/A  . Number of children: N/A  . Years of education: N/A   Social History Main Topics  . Smoking status: Never Smoker  . Smokeless tobacco: Never Used  . Alcohol use Yes  . Drug use: No  . Sexual activity: No   Other Topics Concern  . Not on file   Social History Narrative  . No narrative on file     FAMILY HISTORY:  We obtained a detailed, 4-generation family history.  Significant diagnoses are listed below: Family History  Problem Relation Age of Onset  . Heart disease Maternal Grandmother        d.53  . Stroke Maternal Grandfather   . Diabetes Maternal Grandfather   . Hyperlipidemia Mother   . Diabetes Maternal Uncle   . Heart attack Other   . Obesity Other   . COPD Other   . Asthma Other   . Breast cancer Other 55       Maternal grandfather's sister   Brianna [name] has no children. She has a maternal half-brother (age 69), maternal half-sister (age 84), paternal half-brother (age 26), and paternal half-sister (age 83). None of her siblings have histories of cancer.  Brianna [name] mother is 45 without cancers. She has one maternal uncle, age 68, without cancers. Brianna [name] maternal grandmother died at 69 from a  heart attack. Brianna [name] maternal grandfather is currently 67 without cancers. This grandfather's sister had breast cancer in her 19s and died at age 56.  Brianna [name] father is 34 without cancers. Brianna [name] had a paternal uncle who died as an infant of unknown cause. Brianna [name] father has a maternal half-sister who is currently 21 without cancers. Brianna [name] paternal grandfather died in his 57s without cancers. Brianna [name] paternal grandmother is in her early-80s without cancer. Brianna [name] reports that the only known breast cancer in her paternal family members was in her  paternal grandmother's great-aunt.  Brianna [name] is unaware of previous family history of genetic testing for hereditary cancer risks. Patient's maternal ancestors are of African-American and Caucasian descent, and paternal ancestors are of African-American descent. There is no reported Ashkenazi Jewish ancestry. There is no known consanguinity.  GENETIC COUNSELING ASSESSMENT: Brianna [name] is a 36 y.o. female with a recent diagnosis of breast cancer which is somewhat suggestive of a hereditary cancer syndrome and predisposition to cancer. We, therefore, discussed and recommended the following at today's visit.   DISCUSSION: We reviewed the characteristics, features and inheritance patterns of hereditary cancer syndromes. We also discussed genetic testing, including the appropriate family members to test, the process of testing, insurance coverage and turn-around-time for results. We discussed the implications of a negative, positive and/or variant of uncertain significant result. In order to get genetic test results in a timely manner so that Brianna [name] can use these genetic test results for surgical decisions, we recommended Ms. Grieshop pursue genetic testing for the 9-gene High/Moderate Breast Risk STAT panel offered by [facility]. If this test is negative, we then recommend Ms. Muro pursue reflex genetic testing to the Common Hereditary Cancers Panel. [facility]'s Common Hereditary Cancers Panel includes analysis of the following 46 genes: APC, ATM, AXIN2, BARD1, BMPR1A, BRCA1, BRCA2, BRIP1, CDH1, CDKN2A, CHEK2, CTNNA1, DICER1, EPCAM, GREM1, HOXB13, KIT, MEN1, MLH1, MSH2, MSH3, MSH6, MUTYH, NBN, NF1, NTHL1, PALB2, PDGFRA, PMS2, POLD1, POLE, PTEN, RAD50, RAD51C, RAD51D, SDHA, SDHB, SDHC, SDHD, SMAD4, SMARCA4, STK11, TP53, TSC1, TSC2, and VHL.  Based on Ms. Sinnett personal history of cancer, she meets medical criteria for genetic testing. Despite that she meets criteria, she may still have an out of pocket  cost. We discussed that if her out of pocket cost for testing is over $100, the laboratory will call and confirm whether she wants to proceed with testing.  If the out of pocket cost of testing is less than $100 she will be billed by the genetic testing laboratory.   PLAN: After considering the risks, benefits, and limitations, Ms. Secrist  provided informed consent to pursue genetic testing and the blood sample was sent to [facility] for analysis of the 9-gene High/Moderate Breast Risk STAT Panel with reflex to the 46-gene Common Hereditary Cancers Panel. Results should be available within approximately 2 weeks' time, at which point they will be disclosed by telephone to Ms. Nary, as will any additional recommendations warranted by these results. Ms. Tennant will receive a summary of her genetic counseling visit and a copy of her results once available. This information will also be available in Epic.   Lastly, we encouraged Ms. Porcaro to remain in contact with cancer genetics annually so that we can continuously update the family history and inform her of any changes in cancer genetics and testing that may be of benefit for this family.   Ms.  Riecke questions were answered to her satisfaction today. Our contact  information was provided should additional questions or concerns arise. Thank you for the referral and allowing [place] to share in the care of your patient.   Mal Misty, MS, [facility] Certified Naval [email or web address] phone: [phone]  The patient was seen for a total of 30 minutes in face-to-face genetic counseling.  This patient was discussed with Drs. Magrinat, Lindi Adie and/or Burr Medico who agrees with the above.    _______________________________________________________________________ For Office Staff:  Number of people involved in session: 1 Was an Intern/ student involved with case: no

## 2017-07-22 NOTE — Progress Notes (Signed)
[place] NOTE  Patient Care Team: [name], [name], MD as PCP - General (Family Medicine)  CHIEF COMPLAINTS/PURPOSE OF CONSULTATION:  Newly diagnosed breast cancer  HISTORY OF PRESENTING ILLNESS:  Brianna [name] 36 y.o. female is here because of recent diagnosis of right breast cancer. Patient felt a mass in the right breast upper outer quadrant near her axilla. She felt it in March 2018 and brought to the attention to her primary care physician in July 2018. She was immediately referred for mammogram and ultrasound. Mammogram revealed a 1.4 cm lesion and ultrasound confirmed this is 1.1 cm in size. There was also abnormal axillary lymph node with thickened cortex. Biopsy of the tumor as well as lymph node was performed. The lump revealed invasive ductal carcinoma grade 3 that was ER positive PR negative HER-2 positive. Lymph node biopsy was benign. Patient was seen by Dr.Tsuei who referred the patient to [place] for discussion regarding treatment planning.  I reviewed her records extensively and collaborated the history with the patient.  SUMMARY OF ONCOLOGIC HISTORY:   Malignant neoplasm of upper-outer quadrant of right breast in female, estrogen receptor positive ([place])   07/15/2017 Initial Diagnosis    Patient palpated a week mass in the right upper outer quadrant breast near the axilla in March 2018 mammogram revealed irregular spiculated mass 1.4 cm, by ultrasound measured 1.1 cm with a suspicious right axillary lymph node; biopsy revealed IDC grade 3, lymph node benign, ER 90%, PR 0%, Ki-67 40%, HER-2 positive ratio 2.6, T1c N0 stage IA, AJCC 8 clinical stage       MEDICAL HISTORY:  Past Medical History:  Diagnosis Date  . GERD (gastroesophageal reflux disease)   . IBS (irritable bowel syndrome)   . Prediabetes     SURGICAL HISTORY: No past surgical history on file.  SOCIAL HISTORY: Social History   Social History  . Marital status: Married    Spouse  name: N/A  . Number of children: N/A  . Years of education: N/A   Occupational History  . Not on file.   Social History Main Topics  . Smoking status: Never Smoker  . Smokeless tobacco: Never Used  . Alcohol use Yes  . Drug use: No  . Sexual activity: No   Other Topics Concern  . Not on file   Social History Narrative  . No narrative on file    FAMILY HISTORY: Family History  Problem Relation Age of Onset  . Heart disease Maternal Grandmother   . Stroke Maternal Grandfather   . Diabetes Maternal Grandfather   . Hyperlipidemia Mother   . Diabetes Maternal Uncle   . Heart attack Other   . Obesity Other   . COPD Other   . Asthma Other     ALLERGIES:  has No Known Allergies.  MEDICATIONS:  Current Outpatient Prescriptions  Medication Sig Dispense Refill  . Clindamycin-Benzoyl Per, Refr, gel Apply to face once daily in the morning.    Marland Kitchen spironolactone (ALDACTONE) 100 MG tablet Take 100 mg by mouth daily.    . Sulfacetamide Sodium-Sulfur (CLENIA FOAMING [place]) 10-5 % EMUL Apply topically.    . tretinoin (RETIN-A) 0.025 % gel Apply to face nightly     No current facility-administered medications for this visit.     REVIEW OF SYSTEMS:   Constitutional: Denies fevers, chills or abnormal night sweats Eyes: Denies blurriness of vision, double vision or watery eyes Ears, nose, mouth, throat, and face: Denies mucositis or sore throat Respiratory:  Denies cough, dyspnea or wheezes Cardiovascular: Denies palpitation, chest discomfort or lower extremity swelling Gastrointestinal:  Denies nausea, heartburn or change in bowel habits Skin: Denies abnormal skin rashes Lymphatics: Denies new lymphadenopathy or easy bruising Neurological:Denies numbness, tingling or new weaknesses Behavioral/Psych: Mood is stable, no new changes  Breast: Palpable lump in the right breast near the axilla All other systems were reviewed with the patient and are negative.  PHYSICAL  EXAMINATION: ECOG PERFORMANCE STATUS: 1 - Symptomatic but completely ambulatory  Vitals:   07/22/17 1356  BP: 123/76  Pulse: 85  Resp: 18  Temp: 98.5 F (36.9 C)  SpO2: 100%   Filed Weights   07/22/17 1356  Weight: 166 lb 6.4 oz (75.5 kg)    GENERAL:alert, no distress and comfortable SKIN: skin color, texture, turgor are normal, no rashes or significant lesions EYES: normal, conjunctiva are pink and non-injected, sclera clear OROPHARYNX:no exudate, no erythema and lips, buccal mucosa, and tongue normal  NECK: supple, thyroid normal size, non-tender, without nodularity LYMPH:  no palpable lymphadenopathy in the cervical, axillary or inguinal LUNGS: clear to auscultation and percussion with normal breathing effort HEART: regular rate & rhythm and no murmurs and no lower extremity edema ABDOMEN:abdomen soft, non-tender and normal bowel sounds Musculoskeletal:no cyanosis of digits and no clubbing  PSYCH: alert & oriented x 3 with fluent speech NEURO: no focal motor/sensory deficits BREAST: Palpable lump in the right breast near the axilla. No palpable axillary or supraclavicular lymphadenopathy (exam performed in the presence of a chaperone)   LABORATORY DATA:  I have reviewed the data as listed Lab Results  Component Value Date   WBC 6.0 07/03/2013   HGB 12.5 07/03/2013   HCT 36.7 07/03/2013   MCV 89.5 07/03/2013   PLT 277 07/03/2013   Lab Results  Component Value Date   NA 135 05/25/2017   K 4.3 05/25/2017   CL 102 05/25/2017   CO2 27 05/25/2017    RADIOGRAPHIC STUDIES: I have personally reviewed the radiological reports and agreed with the findings in the report.  ASSESSMENT AND PLAN:  Malignant neoplasm of upper-outer quadrant of right breast in female, estrogen receptor positive ([place]) Patient palpated a week mass in the right upper outer quadrant breast near the axilla in March 2018 mammogram revealed irregular spiculated mass 1.4 cm, by ultrasound measured 1.1  cm with a suspicious right axillary lymph node; biopsy revealed IDC grade 3, lymph node benign, ER 90%, PR 0%, Ki-67 40%, HER-2 positive ratio 2.6, T1c N0 stage IA, AJCC 8 clinical stage  Pathology and radiology counseling: Discussed with the patient, the details of pathology including the type of breast cancer,the clinical staging, the significance of ER, PR and HER-2/neu receptors and the implications for treatment. After reviewing the pathology in detail, we proceeded to discuss the different treatment options between surgery, radiation, chemotherapy, antiestrogen therapies.  Recommendation: 1. Breast conserving surgery followed by 2. adjuvant chemotherapy with Herceptin 3. Followed by adjuvant radiation 4. Followed by adjuvant antiestrogen therapy   if the tumor is greater than 1 cm size,I would recommend treatment with TCH 6 followed by Herceptin maintenance for 6 months total duration.  if the tumor is less than 1 cm in size, I would recommend Taxol Herceptin weekly 12 followed by Herceptin maintenance for 6 months total duration  patient is getting genetics consultation today.   Because it could take up to 3 weeks for genetic result, I recommended that she stop tamoxifen therapy today.  Tamoxifen counseling: I discussed risks and  benefits of tamoxifen and she is agreeable to begin today.  Return to clinic after surgery to start planning for chemotherapy.  All questions were answered. The patient knows to call the clinic with any problems, questions or concerns.    Rulon Eisenmenger, MD 07/22/17

## 2017-07-22 NOTE — Telephone Encounter (Signed)
Spoke with patient to ask if she could see Genetics at 3pm today after her appointment with Dr. Lindi Adie.  She confirmed.  APpt. Made. Ok per [employer]

## 2017-07-22 NOTE — Assessment & Plan Note (Signed)
Patient palpated a week mass in the right upper outer quadrant breast near the axilla in March 2018 mammogram revealed irregular spiculated mass 1.4 cm, by ultrasound measured 1.1 cm with a suspicious right axillary lymph node; biopsy revealed IDC grade 3, lymph node benign, ER 90%, PR 0%, Ki-67 40%, HER-2 positive ratio 2.6, T1c N0 stage IA, AJCC 8 clinical stage  Pathology and radiology counseling: Discussed with the patient, the details of pathology including the type of breast cancer,the clinical staging, the significance of ER, PR and HER-2/neu receptors and the implications for treatment. After reviewing the pathology in detail, we proceeded to discuss the different treatment options between surgery, radiation, chemotherapy, antiestrogen therapies.  Recommendation: 1. Breast conserving surgery followed by 2. adjuvant chemotherapy with Herceptin 3. Followed by adjuvant radiation 4. Followed by adjuvant antiestrogen therapy  Return to clinic after surgery to start planning for chemotherapy. I would recommend treatment with [place] 6 followed by Herceptin maintenance for 6 months total duration.

## 2017-07-24 DIAGNOSIS — C50911 Malignant neoplasm of unspecified site of right female breast: Secondary | ICD-10-CM

## 2017-07-24 HISTORY — DX: Malignant neoplasm of unspecified site of right female breast: C50.911

## 2017-08-02 ENCOUNTER — Encounter: Payer: Self-pay | Admitting: Radiation Oncology

## 2017-08-02 ENCOUNTER — Telehealth: Payer: Self-pay | Admitting: Genetics

## 2017-08-02 NOTE — Telephone Encounter (Signed)
Reviewed that the 9-gene high/moderate breast risk STAT panel performed by [facility] was negative for mutations. Will reflex to remaining genes included on [facility]'s 46-gene Common Hereditary Cancer panel. Will call patient when remaining results are available. Final risk assessment and documentation will be made at that time. A portion of her STAT panel results are included below for reference.  

## 2017-08-03 NOTE — Progress Notes (Signed)
Location of Breast Cancer: Right Breast Upper outer quadrant  Histology per Pathology Report: Diagnosis 07/15/2017: 1. Breast, right, needle core biopsy, UOQ - INVASIVE DUCTAL CARCINOMA, SEE COMMENT. 2. Lymph node, needle/core biopsy, right and axilla - BENIGN LYMPH NODE.- NO METASTATIC MALIGNANCY Receptor Status: ER(90%+), PR (0% neg), Her2-neu (+ratio=2.60), Ki-67(40%)  Did patient present with symptoms (if so, please note symptoms) or was this found on screening mammography?: patient felt a mass near her axilla in March 2018  Past/Anticipated interventions by surgeon, if any:Dr. Tsuei,MD, 07/15/17 breast bx, Surgery scheduled next Wednesday 08/11/17  Past/Anticipated interventions by medical oncology, if any: Chemotherapy Dr. Lindi Adie ,MD seen 07/22/17  Lymphedema issues, if any:   No  Pain issues, if any: soreness right breast,   SAFETY ISSUES: NO  Prior radiation? NO  Pacemaker/ICD? NO  Possible current pregnancy? No  Is the patient on methotrexate? NO  Current Complaints / other details: Married, Menarche age 34, G25P0 ,1st pregnancy age 6, 3 ectopic pregnancies,on orla contraceptives 7 years, never any tobacco products,occasional alcohol, no drug use BP (!) 149/77 Comment: LFA sitting  Pulse 96   Temp 98.4 F (36.9 C) (Oral)   Resp 20   Ht '5\' 3"'$  (1.6 m)   Wt 170 lb (77.1 kg)   LMP 07/28/2017   BMI 30.11 kg/m   Wt Readings from Last 3 Encounters:  08/05/17 170 lb (77.1 kg)  08/05/17 165 lb (74.8 kg)  07/22/17 166 lb 6.4 oz (75.5 kg)    Allergies: NKA    Rebecca Eaton, RN 08/03/2017,7:42 AM

## 2017-08-05 ENCOUNTER — Other Ambulatory Visit: Payer: Self-pay | Admitting: Surgery

## 2017-08-05 ENCOUNTER — Ambulatory Visit: Payer: Self-pay | Admitting: Surgery

## 2017-08-05 ENCOUNTER — Encounter (HOSPITAL_BASED_OUTPATIENT_CLINIC_OR_DEPARTMENT_OTHER): Payer: Self-pay | Admitting: *Deleted

## 2017-08-05 ENCOUNTER — Ambulatory Visit
Admission: RE | Admit: 2017-08-05 | Discharge: 2017-08-05 | Disposition: A | Discharge status: Home / Self Care | Payer: 59 | Source: Ambulatory Visit | Attending: Radiation Oncology | Admitting: Radiation Oncology

## 2017-08-05 ENCOUNTER — Encounter: Payer: Self-pay | Admitting: Radiation Oncology

## 2017-08-05 ENCOUNTER — Other Ambulatory Visit: Payer: Self-pay | Admitting: *Deleted

## 2017-08-05 VITALS — BP 149/77 | HR 96 | Temp 98.4°F | Resp 20 | Ht 63.0 in | Wt 170.0 lb

## 2017-08-05 DIAGNOSIS — C50911 Malignant neoplasm of unspecified site of right female breast: Secondary | ICD-10-CM

## 2017-08-05 DIAGNOSIS — Z170 Estrogen receptor positive status [ER+]: Principal | ICD-10-CM

## 2017-08-05 DIAGNOSIS — C50411 Malignant neoplasm of upper-outer quadrant of right female breast: Secondary | ICD-10-CM | POA: Diagnosis not present

## 2017-08-05 NOTE — Progress Notes (Signed)
Please see the Nurse Progress Note in the MD Initial Consult Encounter for this patient. 

## 2017-08-05 NOTE — Pre-Procedure Instructions (Signed)
To come for BMET 

## 2017-08-05 NOTE — Progress Notes (Signed)
Radiation Oncology         [phone] ________________________________  Name: Brianna [name]        MRN: [id number]  Date of Service: 08/05/2017 DOB: 1980-12-04  TT:[id number], Malka So, MD  Georganna Skeans, MD     REFERRING PHYSICIAN: Georganna Skeans, MD   DIAGNOSIS: The encounter diagnosis was Malignant neoplasm of upper-outer quadrant of right breast in female, estrogen receptor positive ([place]).   HISTORY OF PRESENT ILLNESS: Brianna [name] is a 36 y.o. female seen at the request of Dr. Gershon Crane for a new diagnosis of right breast cancer. She noted a palpable mass in the right breast in March 2018. She was having dental work which prevented her from having this evaluated until she saw her primary provider in July 2018. Mammography revealed that there was a 1.4 cm mass in the right breast. Diagnostic ultrasound confirmed the lesion to be 1.1 cm, and there was thickening of the cortex of an axillary lymph node. 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 Her lymph node seen on ultrasound was also biopsied and negative and felt to be concordant. She is planning to undergo right lumpectomy with sentinel node assessment on 08/11/2017 with Dr. Gershon Crane. She is also met with Dr. Lindi Adie on 07/22/17 to discuss adjuvant chemotherapy with Herceptin. She comes today to review options for adjuvant radiotherapy.   PREVIOUS RADIATION THERAPY: No   PAST MEDICAL HISTORY:  Past Medical History:  Diagnosis Date  . Acne   . History of concussion 08/2016  . IBS (irritable bowel syndrome)    no current med.  . Invasive ductal carcinoma of breast, right ([place]) 07/2017  . Pre-diabetes        PAST SURGICAL HISTORY: Past Surgical History:  Procedure Laterality Date  . WISDOM TOOTH EXTRACTION       FAMILY HISTORY:  Family History  Problem Relation Age of Onset  . Heart disease Maternal Grandmother        d.53  . Stroke  Maternal Grandfather   . Diabetes Maternal Grandfather   . Hyperlipidemia Mother   . Diabetes Maternal Uncle   . Heart attack Other   . Obesity Other   . COPD Other   . Asthma Other   . Breast cancer Other 14       Maternal grandfather's sister     SOCIAL HISTORY:  reports that she has never smoked. She has never used smokeless tobacco. She reports that she drinks alcohol. She reports that she does not use drugs. The patient is married and lives in [place]. She is a Education officer, museum and works for [employer] She is originally from [place].    ALLERGIES: Patient has no known allergies.   MEDICATIONS:  Current Outpatient Prescriptions  Medication Sig Dispense Refill  . Clindamycin-Benzoyl Per, Refr, gel Apply to face once daily in the morning.    . naproxen sodium (ANAPROX) 220 MG tablet Take 220 mg by mouth as needed.    Marland Kitchen spironolactone (ALDACTONE) 100 MG tablet Take 100 mg by mouth daily.    . Sulfacetamide Sodium-Sulfur (CLENIA FOAMING [place]) 10-5 % EMUL Apply topically.    . tretinoin (RETIN-A) 0.025 % gel Apply to face nightly     No current facility-administered medications for this encounter.      REVIEW OF SYSTEMS: On review of systems, the patient reports that she is doing well overall. She is ready to move forward with treatment,  and reports soreness in her right breast since her biopsy. She denies any chest pain, shortness of breath, cough, fevers, chills, night sweats, unintended weight changes. She denies any bowel or bladder disturbances, and denies abdominal pain, nausea or vomiting. She denies any new musculoskeletal or joint aches or pains. A complete review of systems is obtained and is otherwise negative.     PHYSICAL EXAM:  Wt Readings from Last 3 Encounters:  08/05/17 165 lb (74.8 kg)  08/05/17 170 lb (77.1 kg)  07/22/17 166 lb 6.4 oz (75.5 kg)   Temp Readings from Last 3 Encounters:  08/05/17 98.4 F (36.9 C) (Oral)  07/22/17 98.5 F (36.9 C)  (Oral)  09/30/16 98.4 F (36.9 C) (Oral)   BP Readings from Last 3 Encounters:  08/05/17 (!) 149/77  07/22/17 123/76  05/25/17 106/70   Pulse Readings from Last 3 Encounters:  08/05/17 96  07/22/17 85  05/25/17 61   Pain Assessment Pain Score: 2  Pain Loc: Breast (right soreness)/10  In general this is a well appearing African American female in no acute distress. She is alert and oriented x4 and appropriate throughout the examination. HEENT reveals that the patient is normocephalic, atraumatic. EOMs are intact. PERRLA. Skin is intact without any evidence of gross lesions. Cardiopulmonary assessment is negative for acute distress and she exhibits normal effort. Breast exam is deferred to her next visit following surgery.  ECOG = 1  0 - Asymptomatic (Fully active, able to carry on all predisease activities without restriction)  1 - Symptomatic but completely ambulatory (Restricted in physically strenuous activity but ambulatory and able to carry out work of a light or sedentary nature. For example, light housework, office work)  2 - Symptomatic, <50% in bed during the day (Ambulatory and capable of all self care but unable to carry out any work activities. Up and about more than 50% of waking hours)  3 - Symptomatic, >50% in bed, but not bedbound (Capable of only limited self-care, confined to bed or chair 50% or more of waking hours)  4 - Bedbound (Completely disabled. Cannot carry on any self-care. Totally confined to bed or chair)  5 - Death   Eustace Pen MM, Creech RH, Tormey DC, et al. [phone]). "Toxicity and response criteria of the [facility]". [place] Oncol. 5 (6): 649-55    LABORATORY DATA:  Lab Results  Component Value Date   WBC 6.0 07/03/2013   HGB 12.5 07/03/2013   HCT 36.7 07/03/2013   MCV 89.5 07/03/2013   PLT 277 07/03/2013   Lab Results  Component Value Date   NA 135 05/25/2017   K 4.3 05/25/2017   CL 102 05/25/2017   CO2 27  05/25/2017   Lab Results  Component Value Date   AST 15 07/03/2013      RADIOGRAPHY: US Breast Ltd Uni Right Inc Axilla  Result Date: 07/09/2017 CLINICAL DATA:  36 year old female presenting for evaluation of a palpable mass in the right breast. She states she initially felt this around March of this year, but has not noted a difference since first identifying it. EXAM: 2D DIGITAL DIAGNOSTIC BILATERAL MAMMOGRAM WITH CAD AND ADJUNCT TOMO RIGHT BREAST ULTRASOUND COMPARISON:  None. ACR Breast Density Category d: The breast tissue is extremely dense, which lowers the sensitivity of mammography. FINDINGS: A BB has been placed on the upper-outer quadrant of the right breast indicating the palpable site of concern. On the spot compression tomosynthesis exaggerated right CC view there is an  irregular mass with spiculated margins measuring approximately 1.4 cm. No other suspicious calcifications, masses or areas of distortion are seen in the bilateral breasts. Mammographic images were processed with CAD. Physical exam of the palpable site in the upper-outer quadrant of the right breast demonstrates a subtle firm mass at approximately 1030, 8 cm from the nipple. Ultrasound targeted to the palpable site at 10:30, 8 cm from the nipple in the right breast demonstrates an irregular hypoechoic mass with indistinct margins measuring 1.1 x 1.0 x 1.0 cm. Approximately 2.5 cm away from the mass, in the lower axilla is an abnormal appearing lymph node with a thickened neck a nodular cortex. IMPRESSION: 1. There is an indeterminate mass at the palpable site in the right breast at 10:30. This could represent either an abnormal lymph node or a suspicious breast mass. 2.  There is a suspicious lymph node in the right axilla. 3.  No mammographic evidence of left breast malignancy. RECOMMENDATION: Ultrasound-guided biopsy is recommended for the right breast mass and the right axillary lymph node. This has been scheduled for  07/15/2017 at 3:45 p.m. I have discussed the findings and recommendations with the patient. Results were also provided in writing at the conclusion of the visit. If applicable, a reminder letter will be sent to the patient regarding the next appointment. BI-RADS CATEGORY  4: Suspicious. Electronically Signed   By: Ammie Ferrier M.D.   On: 07/09/2017 14:31   Mm Diag Breast Tomo Bilateral  Result Date: 07/09/2017 CLINICAL DATA:  36 year old female presenting for evaluation of a palpable mass in the right breast. She states she initially felt this around March of this year, but has not noted a difference since first identifying it. EXAM: 2D DIGITAL DIAGNOSTIC BILATERAL MAMMOGRAM WITH CAD AND ADJUNCT TOMO RIGHT BREAST ULTRASOUND COMPARISON:  None. ACR Breast Density Category d: The breast tissue is extremely dense, which lowers the sensitivity of mammography. FINDINGS: A BB has been placed on the upper-outer quadrant of the right breast indicating the palpable site of concern. On the spot compression tomosynthesis exaggerated right CC view there is an irregular mass with spiculated margins measuring approximately 1.4 cm. No other suspicious calcifications, masses or areas of distortion are seen in the bilateral breasts. Mammographic images were processed with CAD. Physical exam of the palpable site in the upper-outer quadrant of the right breast demonstrates a subtle firm mass at approximately 1030, 8 cm from the nipple. Ultrasound targeted to the palpable site at 10:30, 8 cm from the nipple in the right breast demonstrates an irregular hypoechoic mass with indistinct margins measuring 1.1 x 1.0 x 1.0 cm. Approximately 2.5 cm away from the mass, in the lower axilla is an abnormal appearing lymph node with a thickened neck a nodular cortex. IMPRESSION: 1. There is an indeterminate mass at the palpable site in the right breast at 10:30. This could represent either an abnormal lymph node or a suspicious breast  mass. 2.  There is a suspicious lymph node in the right axilla. 3.  No mammographic evidence of left breast malignancy. RECOMMENDATION: Ultrasound-guided biopsy is recommended for the right breast mass and the right axillary lymph node. This has been scheduled for 07/15/2017 at 3:45 p.m. I have discussed the findings and recommendations with the patient. Results were also provided in writing at the conclusion of the visit. If applicable, a reminder letter will be sent to the patient regarding the next appointment. BI-RADS CATEGORY  4: Suspicious. Electronically Signed   By: Ammie Ferrier  M.D.   On: 07/09/2017 14:31   [place] Axillary Node Core Biopsy Right  Addendum Date: 07/16/2017   ADDENDUM REPORT: 07/16/2017 11:36 ADDENDUM: Pathology revealed grade III invasive ductal carcinoma in the RIGHT breast and a benign lymph node in the RIGHT axilla. This was found to be concordant by Dr. Kristopher Oppenheim. Pathology results were discussed with the patient by telephone. The patient reported doing well after the biopsies with tenderness at the sites. Post biopsy instructions and care were reviewed and questions were answered. The patient was encouraged to call [facility] for any additional concerns. Surgical consultation has been arranged with Dr. Donnie Mesa at [facility] on July 19, 2017. Pathology results reported by Susa Raring RN, BSN on 07/16/2017. Electronically Signed   By: Kristopher Oppenheim M.D.   On: 07/16/2017 11:36   Result Date: 07/16/2017 CLINICAL DATA:  36 year old female with a right breast mass and indeterminate right axillary lymph node. EXAM: ULTRASOUND GUIDED CORE NEEDLE BIOPSY OF A RIGHT AXILLARY NODE COMPARISON:  Previous exam(s). FINDINGS: I met with the patient and we discussed the procedure of ultrasound-guided biopsy, including benefits and alternatives. We discussed the high likelihood of a successful procedure. We discussed the risks of  the procedure, including infection, bleeding, tissue injury, clip migration, and inadequate sampling. Informed written consent was given. The usual time-out protocol was performed immediately prior to the procedure. Using sterile technique and 1% Lidocaine as local anesthetic, under direct ultrasound visualization, a 14 gauge spring-loaded device was used to perform biopsy of a right axillary using a lateral approach. At the conclusion of the procedure a HydroMARK tissue marker clip was deployed into the biopsy cavity. Follow up 2 view mammogram was performed and dictated separately. IMPRESSION: Ultrasound guided biopsy of a right axillary lymph node. No apparent complications. Electronically Signed: By: Kristopher Oppenheim M.D. On: 07/15/2017 16:57   Mm Clip Placement Right  Result Date: 07/15/2017 CLINICAL DATA:  Clip films status post ultrasound-guided biopsy of a right breast mass and right axillary lymph node. EXAM: DIAGNOSTIC RIGHT MAMMOGRAM POST ULTRASOUND BIOPSY COMPARISON:  Previous exam(s). FINDINGS: Mammographic images were obtained following ultrasound guided biopsy of a right breast mass and right axillary lymph node. A ribbon shaped clip is identified in the expected location in the upper outer quadrant of the right breast in the region of the patient's right breast mass. A second HydroMARK clip is identified within a right axillary lymph node. IMPRESSION: Ribbon shaped clip in the upper-outer right breast and HydroMARK clip within a right axillary lymph node in the expected locations after ultrasound-guided biopsies. Final Assessment: Post Procedure Mammograms for Marker Placement Electronically Signed   By: Kristopher Oppenheim M.D.   On: 07/15/2017 16:59   [place] Rt Breast Bx W Loc Dev 1st Lesion Img Bx Spec US Guide  Addendum Date: 07/16/2017   ADDENDUM REPORT: 07/16/2017 11:36 ADDENDUM: Pathology revealed grade III invasive ductal carcinoma in the RIGHT breast and a benign lymph node in the RIGHT axilla.  This was found to be concordant by Dr. Kristopher Oppenheim. Pathology results were discussed with the patient by telephone. The patient reported doing well after the biopsies with tenderness at the sites. Post biopsy instructions and care were reviewed and questions were answered. The patient was encouraged to call [facility] for any additional concerns. Surgical consultation has been arranged with Dr. Donnie Mesa at [facility] on July 19, 2017. Pathology results reported by [facility]  Anitra Lauth RN, BSN on 07/16/2017. Electronically Signed   By: Kristopher Oppenheim M.D.   On: 07/16/2017 11:36   Result Date: 07/16/2017 CLINICAL DATA:  36 year old female with right breast mass in mid right axillary lymph node. EXAM: ULTRASOUND GUIDED RIGHT BREAST CORE NEEDLE BIOPSY COMPARISON:  Previous exam(s). FINDINGS: I met with the patient and we discussed the procedure of ultrasound-guided biopsy, including benefits and alternatives. We discussed the high likelihood of a successful procedure. We discussed the risks of the procedure, including infection, bleeding, tissue injury, clip migration, and inadequate sampling. Informed written consent was given. The usual time-out protocol was performed immediately prior to the procedure. Lesion quadrant: Upper-outer quadrant. Using sterile technique and 1% Lidocaine as local anesthetic, under direct ultrasound visualization, a 14 gauge spring-loaded device was used to perform biopsy of a right breast mass at the 10 o'clock position 8 cm from the nipple. Using a lateral approach. At the conclusion of the procedure a ribbon shaped tissue marker clip was deployed into the biopsy cavity. Follow up 2 view mammogram was performed and dictated separately. IMPRESSION: Ultrasound guided biopsy of a right breast mass. No apparent complications. Electronically Signed: By: Kristopher Oppenheim M.D. On: 07/15/2017 16:57       IMPRESSION/PLAN: 1. Stage IA,  [id number], ER positive, HER2 amplified, grade 3 invasive ductal carcinoma of the right breast. Dr. Lisbeth Renshaw discusses the pathology findings and reviews the nature of invasive disease. The consensus from the breast conference include breast conservation with lumpectomy with sentinel mapping. She would be a candidate for adjuvant chemotherapy with Herceptin, followed by external radiotherapy to the breast followed by antiestrogen therapy. We discussed the risks, benefits, short, and long term effects of radiotherapy, and the patient is interested in proceeding. Dr. Lisbeth Renshaw discusses the delivery and logistics of radiotherapy and would anticipate a course of 6 1/2 weeks of treatment to the breast. Her final pathology will outline if there is a role for regional node treatment as well. We will plan to see her back a few weeks after her last cycle of chemotherapy.   In a visit lasting 60 minutes, greater than 50% of the time was spent face to face discussing radiotherapy utility and the logistics along with her anticipated chemotherapy regimen, and coordinating the patient's care.   The above documentation reflects my direct findings during this shared patient visit. Please see the separate note by Dr. Lisbeth Renshaw on this date for the remainder of the patient's plan of care.    Carola Rhine, PAC

## 2017-08-05 NOTE — Pre-Procedure Instructions (Signed)
To come pick up Ensure pre-surgery drink 10 oz. - to drink by 1030 DOS. 

## 2017-08-06 ENCOUNTER — Encounter (HOSPITAL_BASED_OUTPATIENT_CLINIC_OR_DEPARTMENT_OTHER)
Admission: RE | Admit: 2017-08-06 | Discharge: 2017-08-06 | Disposition: A | Discharge status: Home / Self Care | Payer: 59 | Source: Ambulatory Visit | Attending: Surgery | Admitting: Surgery

## 2017-08-06 ENCOUNTER — Telehealth: Payer: Self-pay | Admitting: Hematology and Oncology

## 2017-08-06 LAB — BASIC METABOLIC PANEL
ANION GAP: 4 — AB (ref 5–15)
BUN: 9 mg/dL (ref 6–20)
CHLORIDE: 103 mmol/L (ref 101–111)
CO2: 28 mmol/L (ref 22–32)
Calcium: 9.2 mg/dL (ref 8.9–10.3)
Creatinine, Ser: 0.88 mg/dL (ref 0.44–1.00)
GFR calc Af Amer: >60 mL/min (ref >60)
GLUCOSE: 81 mg/dL (ref 65–99)
POTASSIUM: 4.7 mmol/L (ref 3.5–5.1)
Sodium: 135 mmol/L (ref 135–145)

## 2017-08-06 NOTE — Telephone Encounter (Signed)
Spoke with patient regarding her upcoming appts next week.

## 2017-08-06 NOTE — Progress Notes (Signed)
Ensure pre surgery drink given with instructions to complete by 1030 dos, pt verbalized understanding. 

## 2017-08-09 ENCOUNTER — Telehealth: Payer: Self-pay | Admitting: Genetic Counselor

## 2017-08-09 ENCOUNTER — Ambulatory Visit: Payer: Self-pay | Admitting: Genetic Counselor

## 2017-08-09 DIAGNOSIS — Z1379 Encounter for other screening for genetic and chromosomal anomalies: Secondary | ICD-10-CM

## 2017-08-09 DIAGNOSIS — Z170 Estrogen receptor positive status [ER+]: Principal | ICD-10-CM

## 2017-08-09 DIAGNOSIS — C50411 Malignant neoplasm of upper-outer quadrant of right female breast: Secondary | ICD-10-CM

## 2017-08-09 NOTE — Progress Notes (Addendum)
HPI: Brianna [name] was previously seen in the [facility] due to a personal and family history of cancer and concerns regarding a hereditary predisposition to cancer. Please refer to our prior cancer genetics clinic note for more information regarding Brianna [name] medical, social and family histories, and our assessment and recommendations, at the time. Brianna [name] recent genetic test results were disclosed to her, as were recommendations warranted by these results. These results and recommendations are discussed in more detail below.  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  The Hereditary Gene Panel offered by [facility] includes sequencing and/or deletion duplication testing of the following 46 genes: APC, ATM, AXIN2, BARD1, BMPR1A, BRCA1, BRCA2, BRIP1, CDH1, CDKN2A (p14ARF), CDKN2A (p16INK4a), CHEK2, CTNNA1, DICER1, EPCAM (Deletion/duplication testing only), GREM1 (promoter region deletion/duplication testing only), KIT, MEN1, MLH1, MSH2, MSH3, MSH6, MUTYH, NBN, NF1, NHTL1, PALB2, PDGFRA, PMS2, POLD1, POLE, PTEN, RAD50, RAD51C, RAD51D, SDHB, SDHC, SDHD, SMAD4, SMARCA4. STK11, TP53, TSC1, TSC2, and VHL.  The following genes were evaluated for sequence changes only: SDHA and HOXB13 c.251G>A variant only.  The report date is August 06, 2017.       FAMILY HISTORY:  We obtained a detailed,  4-generation family history.  Significant diagnoses are listed below: Family History  Problem Relation Age of Onset  . Heart disease Maternal Grandmother        d.53  . Stroke Maternal Grandfather   . Diabetes Maternal Grandfather   . Hyperlipidemia Mother   . Diabetes Maternal Uncle   . Heart attack Other   . Obesity Other   . COPD Other   . Asthma Other   . Breast cancer Other 48       Maternal grandfather's sister    Brianna [name] has no children. She has a maternal half-brother (age 9), maternal half-sister (age 12), paternal half-brother (age 58), and paternal half-sister (age 77). None of her siblings have histories of cancer.  Brianna [name] mother is 30 without cancers. She has one maternal uncle, age 37, without cancers. Brianna [name] maternal grandmother died at 12 from a heart attack. Brianna [name] maternal grandfather is currently 30 without cancers. This grandfather's sister had breast cancer in her 58's and died at age 59.  Brianna [name] father is 15 without cancers. Brianna [name] had a paternal uncle who died as an infant of unknown cause. Brianna [name] father has a maternal half-sister who is currently 70 without cancers. Brianna [name] paternal grandfather died in his 69's without cancers. Brianna [name] paternal grandmother is in her early-80's without cancer. Brianna [name] reports that the only known breast cancer in her paternal family members was in her paternal grandmother's great-aunt.  Brianna [name] is unaware of previous family history of genetic testing for hereditary cancer risks. Patient's maternal ancestors are of African-American and Caucasian descent, and paternal ancestors are of African-American descent. There is no reported Ashkenazi  Jewish ancestry. There is no known consanguinity.  GENETIC TEST RESULTS: Genetic testing reported out on August 06, 2017 through the Common Hereditary cancer panel found no deleterious mutations.  The Hereditary Gene Panel offered by [facility]  includes sequencing and/or deletion duplication testing of the following 46 genes: APC, ATM, AXIN2, BARD1, BMPR1A, BRCA1, BRCA2, BRIP1, CDH1, CDKN2A (p14ARF), CDKN2A (p16INK4a), CHEK2, CTNNA1, DICER1, EPCAM (Deletion/duplication testing only), GREM1 (promoter region deletion/duplication testing only), KIT, MEN1, MLH1, MSH2, MSH3, MSH6, MUTYH, NBN, NF1, NHTL1, PALB2, PDGFRA, PMS2, POLD1, POLE, PTEN, RAD50, RAD51C, RAD51D, SDHB, SDHC, SDHD, SMAD4, SMARCA4. STK11, TP53, TSC1, TSC2, and VHL.  The following genes were evaluated for sequence changes only: SDHA and HOXB13 c.251G>A variant only.  The test report has been scanned into EPIC and is located under the Molecular Pathology section of the Results Review tab.   We discussed with Brianna [name] that since the current genetic testing is not perfect, it is possible there may be a gene mutation in one of these genes that current testing cannot detect, but that chance is small. We also discussed, that it is possible that another gene that has not yet been discovered, or that we have not yet tested, is responsible for the cancer diagnoses in the family, and it is, therefore, important to remain in touch with cancer genetics in the future so that we can continue to offer Brianna [name] the most up to date genetic testing.   Genetic testing did detect a Variant of Unknown Significance in the SDHB gene called c.65G>C (p.Cys22Ser). At this time, it is unknown if this variant is associated with increased cancer risk or if this is a normal finding, but most variants such as this get reclassified to being inconsequential. It should not be used to make medical management decisions. With time, we suspect the lab will determine the significance of this variant, if any. If we do learn more about it, we will try to contact Brianna [name] to discuss it further. However, it is important to stay in touch with [place] periodically and keep the address and phone number up to date.  UPDATE:  SDHB  c.65G>C (p.Cys22Ser) VUS has been reclassified to LIkely Benign as a result of re-review of the evidence in light of new variant interpretation guidelines and/or new information.  The updated report date is April 26, 2019.     CANCER SCREENING RECOMMENDATIONS:  This result is reassuring and indicates that Ms. Klink likely does not have an increased risk for a future cancer due to a mutation in one of these genes. This normal test also suggests that Ms. Zipp cancer was most likely not due to an inherited predisposition associated with one of these genes.  Most cancers happen by chance and this negative test suggests that her cancer falls into this category.  We, therefore, recommended she continue to follow the cancer management and screening guidelines provided by her oncology and primary healthcare provider.   RECOMMENDATIONS FOR FAMILY MEMBERS: Women in this family might be at some increased risk of developing cancer, over the general population risk, simply due to the family history of cancer. We recommended women in this family have a yearly mammogram beginning at age 9, or 74 years younger than the earliest onset of cancer, an annual clinical breast exam, and perform monthly breast self-exams. Women in this family should also have a gynecological exam as recommended by their primary provider. All family members should have a colonoscopy by age 34.  FOLLOW-UP: Lastly, we discussed  with Ms. Ericsson that cancer genetics is a rapidly advancing field and it is possible that new genetic tests will be appropriate for her and/or her family members in the future. We encouraged her to remain in contact with cancer genetics on an annual basis so we can update her personal and family histories and let her know of advances in cancer genetics that may benefit this family.   Our contact number was provided. Ms. Robicheaux questions were answered to her satisfaction, and she knows she is welcome to call us at  anytime with additional questions or concerns.   Roma Kayser, MS, [facility] Certified Genetic Counselor [email or web address]

## 2017-08-09 NOTE — Telephone Encounter (Signed)
Revealed negative genetic testing.  Discussed that we do not know why she has breast cancer or why there is cancer in the family. It could be due to a different gene that we are not testing, or maybe our current technology may not be able to pick something up.  It will be important for her to keep in contact with genetics to keep up with whether additional testing may be needed.  Revealed SDHB VUS.  Discussed that this would not change her medical management.  Recommended that she keep in contact with [place] periodically in order to keep up with the VUS.  We will recontact if it is reclassified.

## 2017-08-10 ENCOUNTER — Ambulatory Visit
Admission: RE | Admit: 2017-08-10 | Discharge: 2017-08-10 | Disposition: A | Discharge status: Home / Self Care | Payer: 59 | Source: Ambulatory Visit | Attending: Surgery | Admitting: Surgery

## 2017-08-10 DIAGNOSIS — C50911 Malignant neoplasm of unspecified site of right female breast: Secondary | ICD-10-CM

## 2017-08-10 DIAGNOSIS — C50411 Malignant neoplasm of upper-outer quadrant of right female breast: Secondary | ICD-10-CM | POA: Diagnosis not present

## 2017-08-11 ENCOUNTER — Ambulatory Visit (HOSPITAL_BASED_OUTPATIENT_CLINIC_OR_DEPARTMENT_OTHER)
Admission: RE | Admit: 2017-08-11 | Discharge: 2017-08-11 | Disposition: A | Discharge status: Home / Self Care | Payer: 59 | Source: Ambulatory Visit | Attending: Surgery | Admitting: Surgery

## 2017-08-11 ENCOUNTER — Encounter (HOSPITAL_BASED_OUTPATIENT_CLINIC_OR_DEPARTMENT_OTHER): Payer: Self-pay | Admitting: *Deleted

## 2017-08-11 ENCOUNTER — Encounter (HOSPITAL_BASED_OUTPATIENT_CLINIC_OR_DEPARTMENT_OTHER)
Admission: RE | Disposition: A | Discharge status: Home / Self Care | Payer: Self-pay | Source: Ambulatory Visit | Attending: Surgery

## 2017-08-11 ENCOUNTER — Ambulatory Visit (HOSPITAL_COMMUNITY): Payer: 59

## 2017-08-11 ENCOUNTER — Ambulatory Visit (HOSPITAL_BASED_OUTPATIENT_CLINIC_OR_DEPARTMENT_OTHER): Payer: 59 | Admitting: Anesthesiology

## 2017-08-11 ENCOUNTER — Ambulatory Visit
Admission: RE | Admit: 2017-08-11 | Discharge: 2017-08-11 | Disposition: A | Discharge status: Home / Self Care | Payer: 59 | Source: Ambulatory Visit | Attending: Surgery | Admitting: Surgery

## 2017-08-11 ENCOUNTER — Encounter (HOSPITAL_COMMUNITY)
Admission: RE | Admit: 2017-08-11 | Discharge: 2017-08-11 | Disposition: A | Discharge status: Home / Self Care | Payer: 59 | Source: Ambulatory Visit | Attending: Surgery | Admitting: Surgery

## 2017-08-11 DIAGNOSIS — E119 Type 2 diabetes mellitus without complications: Secondary | ICD-10-CM | POA: Insufficient documentation

## 2017-08-11 DIAGNOSIS — K219 Gastro-esophageal reflux disease without esophagitis: Secondary | ICD-10-CM | POA: Insufficient documentation

## 2017-08-11 DIAGNOSIS — K589 Irritable bowel syndrome without diarrhea: Secondary | ICD-10-CM | POA: Diagnosis not present

## 2017-08-11 DIAGNOSIS — C50411 Malignant neoplasm of upper-outer quadrant of right female breast: Secondary | ICD-10-CM | POA: Diagnosis not present

## 2017-08-11 DIAGNOSIS — Z170 Estrogen receptor positive status [ER+]: Secondary | ICD-10-CM | POA: Insufficient documentation

## 2017-08-11 DIAGNOSIS — C50919 Malignant neoplasm of unspecified site of unspecified female breast: Secondary | ICD-10-CM

## 2017-08-11 DIAGNOSIS — Z79899 Other long term (current) drug therapy: Secondary | ICD-10-CM | POA: Diagnosis not present

## 2017-08-11 DIAGNOSIS — K5900 Constipation, unspecified: Secondary | ICD-10-CM | POA: Diagnosis not present

## 2017-08-11 DIAGNOSIS — G8918 Other acute postprocedural pain: Secondary | ICD-10-CM | POA: Diagnosis not present

## 2017-08-11 DIAGNOSIS — I519 Heart disease, unspecified: Secondary | ICD-10-CM | POA: Insufficient documentation

## 2017-08-11 DIAGNOSIS — Z1501 Genetic susceptibility to malignant neoplasm of breast: Secondary | ICD-10-CM | POA: Diagnosis not present

## 2017-08-11 DIAGNOSIS — D0511 Intraductal carcinoma in situ of right breast: Secondary | ICD-10-CM | POA: Diagnosis not present

## 2017-08-11 DIAGNOSIS — Z452 Encounter for adjustment and management of vascular access device: Secondary | ICD-10-CM

## 2017-08-11 DIAGNOSIS — C50911 Malignant neoplasm of unspecified site of right female breast: Secondary | ICD-10-CM

## 2017-08-11 HISTORY — PX: PORTACATH PLACEMENT: SHX2246

## 2017-08-11 HISTORY — DX: Personal history of traumatic brain injury: Z87.820

## 2017-08-11 HISTORY — DX: Malignant neoplasm of unspecified site of right female breast: C50.911

## 2017-08-11 HISTORY — DX: Prediabetes: R73.03

## 2017-08-11 HISTORY — DX: Acne, unspecified: L70.9

## 2017-08-11 HISTORY — PX: BREAST LUMPECTOMY WITH RADIOACTIVE SEED AND SENTINEL LYMPH NODE BIOPSY: SHX6550

## 2017-08-11 LAB — GLUCOSE, CAPILLARY: Glucose-Capillary: 85 mg/dL (ref 65–99)

## 2017-08-11 SURGERY — BREAST LUMPECTOMY WITH RADIOACTIVE SEED AND SENTINEL LYMPH NODE BIOPSY
Anesthesia: General | Site: Chest | Laterality: Right

## 2017-08-11 MED ORDER — ONDANSETRON HCL 4 MG/2ML IJ SOLN
INTRAMUSCULAR | Status: AC
  Filled 2017-08-11: qty 2

## 2017-08-11 MED ORDER — FENTANYL CITRATE (PF) 100 MCG/2ML IJ SOLN
INTRAMUSCULAR | Status: AC
  Filled 2017-08-11: qty 2

## 2017-08-11 MED ORDER — ROCURONIUM BROMIDE 10 MG/ML (PF) SYRINGE
PREFILLED_SYRINGE | INTRAVENOUS | Status: AC
  Filled 2017-08-11: qty 5

## 2017-08-11 MED ORDER — DEXAMETHASONE SODIUM PHOSPHATE 4 MG/ML IJ SOLN
INTRAMUSCULAR | Status: DC | PRN
  Administered 2017-08-11: 10 mg via INTRAVENOUS

## 2017-08-11 MED ORDER — PROPOFOL 10 MG/ML IV BOLUS
INTRAVENOUS | Status: AC
  Filled 2017-08-11: qty 40

## 2017-08-11 MED ORDER — HEPARIN (PORCINE) IN NACL 2-0.9 UNIT/ML-% IJ SOLN
INTRAMUSCULAR | Status: AC | PRN
  Administered 2017-08-11: 1 via INTRAVENOUS

## 2017-08-11 MED ORDER — CEFAZOLIN SODIUM-DEXTROSE 2-4 GM/100ML-% IV SOLN
INTRAVENOUS | Status: AC
  Filled 2017-08-11: qty 100

## 2017-08-11 MED ORDER — OXYCODONE HCL 5 MG/5ML PO SOLN: 5.0000 mg | Freq: Once | ORAL | Status: DC | PRN

## 2017-08-11 MED ORDER — CHLORHEXIDINE GLUCONATE CLOTH 2 % EX PADS: 6.0000 | MEDICATED_PAD | Freq: Once | CUTANEOUS | Status: DC

## 2017-08-11 MED ORDER — ACETAMINOPHEN 500 MG PO TABS
ORAL_TABLET | ORAL | Status: AC
  Filled 2017-08-11: qty 2

## 2017-08-11 MED ORDER — ONDANSETRON HCL 4 MG/2ML IJ SOLN: 4.0000 mg | Freq: Four times a day (QID) | INTRAMUSCULAR | Status: DC | PRN

## 2017-08-11 MED ORDER — PROPOFOL 10 MG/ML IV BOLUS
INTRAVENOUS | Status: DC | PRN
  Administered 2017-08-11: 200 mg via INTRAVENOUS

## 2017-08-11 MED ORDER — NEOSTIGMINE METHYLSULFATE 5 MG/5ML IV SOSY
PREFILLED_SYRINGE | INTRAVENOUS | Status: AC
  Filled 2017-08-11: qty 5

## 2017-08-11 MED ORDER — LACTATED RINGERS IV SOLN
INTRAVENOUS | Status: DC
  Administered 2017-08-11 (×2): via INTRAVENOUS

## 2017-08-11 MED ORDER — CELECOXIB 400 MG PO CAPS
400.0000 mg | ORAL_CAPSULE | ORAL | Status: AC
  Administered 2017-08-11: 400 mg via ORAL

## 2017-08-11 MED ORDER — CEFAZOLIN SODIUM-DEXTROSE 2-4 GM/100ML-% IV SOLN
2.0000 g | INTRAVENOUS | Status: AC
  Administered 2017-08-11: 2 g via INTRAVENOUS

## 2017-08-11 MED ORDER — FENTANYL CITRATE (PF) 100 MCG/2ML IJ SOLN
50.0000 ug | INTRAMUSCULAR | Status: DC | PRN
  Administered 2017-08-11: 100 ug via INTRAVENOUS
  Administered 2017-08-11: 25 ug via INTRAVENOUS

## 2017-08-11 MED ORDER — SODIUM CHLORIDE 0.9 % IJ SOLN
INTRAVENOUS | Status: DC | PRN
  Administered 2017-08-11: 5 mL via SUBCUTANEOUS

## 2017-08-11 MED ORDER — OXYCODONE HCL 5 MG PO TABS: 5.0000 mg | ORAL_TABLET | Freq: Once | ORAL | Status: DC | PRN

## 2017-08-11 MED ORDER — BUPIVACAINE-EPINEPHRINE (PF) 0.5% -1:200000 IJ SOLN
INTRAMUSCULAR | Status: DC | PRN
  Administered 2017-08-11: 30 mL via PERINEURAL

## 2017-08-11 MED ORDER — FENTANYL CITRATE (PF) 100 MCG/2ML IJ SOLN
25.0000 ug | INTRAMUSCULAR | Status: DC | PRN
  Administered 2017-08-11: 25 ug via INTRAVENOUS

## 2017-08-11 MED ORDER — HYDROCODONE-ACETAMINOPHEN 5-325 MG PO TABS: 1.0000 | ORAL_TABLET | Freq: Four times a day (QID) | ORAL | 0 refills | Status: DC | PRN

## 2017-08-11 MED ORDER — DEXAMETHASONE SODIUM PHOSPHATE 10 MG/ML IJ SOLN
INTRAMUSCULAR | Status: AC
  Filled 2017-08-11: qty 1

## 2017-08-11 MED ORDER — LIDOCAINE 2% (20 MG/ML) 5 ML SYRINGE
INTRAMUSCULAR | Status: DC | PRN
  Administered 2017-08-11: 80 mg via INTRAVENOUS

## 2017-08-11 MED ORDER — MIDAZOLAM HCL 2 MG/2ML IJ SOLN
1.0000 mg | INTRAMUSCULAR | Status: DC | PRN
  Administered 2017-08-11: 1 mg via INTRAVENOUS
  Administered 2017-08-11: 2 mg via INTRAVENOUS

## 2017-08-11 MED ORDER — TECHNETIUM TC 99M SULFUR COLLOID FILTERED
1.0000 | Freq: Once | INTRAVENOUS | Status: AC | PRN
  Administered 2017-08-11: 1 via INTRADERMAL

## 2017-08-11 MED ORDER — HEPARIN SOD (PORK) LOCK FLUSH 100 UNIT/ML IV SOLN
INTRAVENOUS | Status: DC | PRN
  Administered 2017-08-11: 500 [IU] via INTRAVENOUS

## 2017-08-11 MED ORDER — BUPIVACAINE-EPINEPHRINE 0.25% -1:200000 IJ SOLN
INTRAMUSCULAR | Status: DC | PRN
  Administered 2017-08-11: 20 mL

## 2017-08-11 MED ORDER — MIDAZOLAM HCL 2 MG/2ML IJ SOLN
INTRAMUSCULAR | Status: AC
  Filled 2017-08-11: qty 2

## 2017-08-11 MED ORDER — CELECOXIB 200 MG PO CAPS
ORAL_CAPSULE | ORAL | Status: AC
  Filled 2017-08-11: qty 2

## 2017-08-11 MED ORDER — GABAPENTIN 300 MG PO CAPS: 300.0000 mg | ORAL_CAPSULE | ORAL | Status: DC

## 2017-08-11 MED ORDER — ACETAMINOPHEN 500 MG PO TABS
1000.0000 mg | ORAL_TABLET | ORAL | Status: AC
  Administered 2017-08-11: 1000 mg via ORAL

## 2017-08-11 MED ORDER — SCOPOLAMINE 1 MG/3DAYS TD PT72: 1.0000 | MEDICATED_PATCH | Freq: Once | TRANSDERMAL | Status: DC | PRN

## 2017-08-11 MED ORDER — ONDANSETRON HCL 4 MG/2ML IJ SOLN
INTRAMUSCULAR | Status: DC | PRN
  Administered 2017-08-11: 4 mg via INTRAVENOUS

## 2017-08-11 SURGICAL SUPPLY — 77 items
APPLIER CLIP 9.375 MED OPEN (MISCELLANEOUS) ×3
BAG DECANTER FOR FLEXI CONT (MISCELLANEOUS) ×3 IMPLANT
BENZOIN TINCTURE PRP APPL 2/3 (GAUZE/BANDAGES/DRESSINGS) ×6 IMPLANT
BINDER BREAST 3XL (GAUZE/BANDAGES/DRESSINGS) IMPLANT
BINDER BREAST LRG (GAUZE/BANDAGES/DRESSINGS) IMPLANT
BINDER BREAST MEDIUM (GAUZE/BANDAGES/DRESSINGS) IMPLANT
BINDER BREAST XLRG (GAUZE/BANDAGES/DRESSINGS) IMPLANT
BINDER BREAST XXLRG (GAUZE/BANDAGES/DRESSINGS) IMPLANT
BLADE CLIPPER SURG (BLADE) ×3 IMPLANT
BLADE HEX COATED 2.75 (ELECTRODE) ×6 IMPLANT
BLADE SURG 10 STRL SS (BLADE) IMPLANT
BLADE SURG 11 STRL SS (BLADE) ×3 IMPLANT
BLADE SURG 15 STRL LF DISP TIS (BLADE) ×4 IMPLANT
BLADE SURG 15 STRL SS (BLADE) ×2
CANISTER SUCT 1200ML W/VALVE (MISCELLANEOUS) ×3 IMPLANT
CATH SINGLE LUMEN 9.6F (PORTABLE EQUIPMENT SUPPLIES) IMPLANT
CHLORAPREP W/TINT 26ML (MISCELLANEOUS) ×6 IMPLANT
CLEANER CAUTERY TIP 5X5 PAD (MISCELLANEOUS) ×2 IMPLANT
CLIP APPLIE 9.375 MED OPEN (MISCELLANEOUS) ×2 IMPLANT
COVER BACK TABLE 60X90IN (DRAPES) ×3 IMPLANT
COVER MAYO STAND STRL (DRAPES) ×3 IMPLANT
COVER PROBE W GEL 5X96 (DRAPES) IMPLANT
COVER SURGICAL LIGHT HANDLE (MISCELLANEOUS) ×3 IMPLANT
DECANTER SPIKE VIAL GLASS SM (MISCELLANEOUS) ×3 IMPLANT
DEVICE DUBIN W/COMP PLATE 8390 (MISCELLANEOUS) ×3 IMPLANT
DRAPE C-ARM 42X72 X-RAY (DRAPES) ×3 IMPLANT
DRAPE LAPAROSCOPIC ABDOMINAL (DRAPES) ×3 IMPLANT
DRAPE LAPAROTOMY TRNSV 102X78 (DRAPE) ×3 IMPLANT
DRAPE UTILITY XL STRL (DRAPES) ×6 IMPLANT
DRSG TEGADERM 4X4.75 (GAUZE/BANDAGES/DRESSINGS) ×6 IMPLANT
ELECT REM PT RETURN 9FT ADLT (ELECTROSURGICAL) ×3
ELECTRODE REM PT RTRN 9FT ADLT (ELECTROSURGICAL) ×2 IMPLANT
GAUZE SPONGE 4X4 12PLY STRL LF (GAUZE/BANDAGES/DRESSINGS) IMPLANT
GLOVE BIO SURGEON STRL SZ7 (GLOVE) ×6 IMPLANT
GLOVE BIOGEL M STRL SZ7.5 (GLOVE) ×6 IMPLANT
GLOVE BIOGEL PI IND STRL 7.0 (GLOVE) ×4 IMPLANT
GLOVE BIOGEL PI IND STRL 7.5 (GLOVE) ×4 IMPLANT
GLOVE BIOGEL PI IND STRL 8 (GLOVE) ×2 IMPLANT
GLOVE BIOGEL PI INDICATOR 7.0 (GLOVE) ×2
GLOVE BIOGEL PI INDICATOR 7.5 (GLOVE) ×2
GLOVE BIOGEL PI INDICATOR 8 (GLOVE) ×1
GLOVE ECLIPSE 6.5 STRL STRAW (GLOVE) ×3 IMPLANT
GOWN STRL REUS W/ TWL LRG LVL3 (GOWN DISPOSABLE) ×4 IMPLANT
GOWN STRL REUS W/ TWL XL LVL3 (GOWN DISPOSABLE) ×2 IMPLANT
GOWN STRL REUS W/TWL LRG LVL3 (GOWN DISPOSABLE) ×2
GOWN STRL REUS W/TWL XL LVL3 (GOWN DISPOSABLE) ×1
ILLUMINATOR WAVEGUIDE N/F (MISCELLANEOUS) IMPLANT
IV KIT MINILOC 20X1 SAFETY (NEEDLE) IMPLANT
KIT MARKER MARGIN INK (KITS) ×3 IMPLANT
KIT PORT POWER 8FR ISP CVUE (Miscellaneous) ×3 IMPLANT
LIGHT WAVEGUIDE WIDE FLAT (MISCELLANEOUS) IMPLANT
NDL SAFETY ECLIPSE 18X1.5 (NEEDLE) ×2 IMPLANT
NEEDLE HYPO 18GX1.5 SHARP (NEEDLE) ×1
NEEDLE HYPO 25X1 1.5 SAFETY (NEEDLE) ×6 IMPLANT
NEEDLE SPNL 22GX3.5 QUINCKE BK (NEEDLE) IMPLANT
NS IRRIG 1000ML POUR BTL (IV SOLUTION) ×3 IMPLANT
PACK BASIN DAY SURGERY FS (CUSTOM PROCEDURE TRAY) ×3 IMPLANT
PAD CLEANER CAUTERY TIP 5X5 (MISCELLANEOUS) ×1
PENCIL BUTTON HOLSTER BLD 10FT (ELECTRODE) ×6 IMPLANT
SHEATH COOK PEEL AWAY SET 9F (SHEATH) ×3 IMPLANT
SLEEVE SCD COMPRESS KNEE MED (MISCELLANEOUS) ×3 IMPLANT
SLEEVE SURGEON STRL (DRAPES) ×6 IMPLANT
SPONGE GAUZE 2X2 8PLY STRL LF (GAUZE/BANDAGES/DRESSINGS) ×3 IMPLANT
SPONGE LAP 18X18 X RAY DECT (DISPOSABLE) IMPLANT
SPONGE LAP 4X18 X RAY DECT (DISPOSABLE) ×3 IMPLANT
STRIP CLOSURE SKIN 1/2X4 (GAUZE/BANDAGES/DRESSINGS) ×6 IMPLANT
SUT MON AB 4-0 PC3 18 (SUTURE) ×6 IMPLANT
SUT PROLENE 2 0 CT2 30 (SUTURE) ×3 IMPLANT
SUT SILK 2 0 SH (SUTURE) IMPLANT
SUT VIC AB 3-0 SH 27 (SUTURE) ×2
SUT VIC AB 3-0 SH 27X BRD (SUTURE) ×4 IMPLANT
SYR 5ML LUER SLIP (SYRINGE) ×3 IMPLANT
SYR CONTROL 10ML LL (SYRINGE) ×6 IMPLANT
TOWEL OR 17X24 6PK STRL BLUE (TOWEL DISPOSABLE) ×3 IMPLANT
TOWEL OR NON WOVEN STRL DISP B (DISPOSABLE) IMPLANT
TUBE CONNECTING 20X1/4 (TUBING) ×3 IMPLANT
YANKAUER SUCT BULB TIP NO VENT (SUCTIONS) ×3 IMPLANT

## 2017-08-11 NOTE — Interval H&P Note (Signed)
History and Physical Interval Note:  08/11/2017 2:12 PM  Brianna [name]  has presented today for surgery, with the diagnosis of Right invasive ductal carcinoma  The various methods of treatment have been discussed with the patient and family. After consideration of risks, benefits and other options for treatment, the patient has consented to  Procedure(s): RIGHT BREAST LUMPECTOMY WITH RADIOACTIVE SEED AND RIGHT SENTINEL LYMPH NODE BIOPSY ERAS PATHWAY (Right) INSERTION PORT-A-CATH WITH ULTRA SOUND (Left) as a surgical intervention .  The patient's history has been reviewed, patient examined, no change in status, stable for surgery.  I have reviewed the patient's chart and labs.  Questions were answered to the patient's satisfaction.     [name] K.

## 2017-08-11 NOTE — Op Note (Signed)
Pre-op Diagnosis:  Right breast cancer Post-op Diagnosis: same Procedure:  Right radioactive seed localized lumpectomy/ blue dye injection/ right axillary sentinel lymph node biopsy/ left subclavian vein port placement Surgeon:  Deven Audi K. Anesthesia:  GEN - LMA/ pectoral block  Indications:  This is a healthy 36 year old female who palpated a vague mass in the right upper outer quadrant of her breast near her axilla. She initially palpated this in March of this year. She brought this to the attention of her primary care physician in July. She was then referred for mammogram followed by ultrasound. 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  ER +/ PR -, Her 2 +.  A nearby lymph node was mildly abnormal with thickened cortex. The lymph node was also biopsied and was benign.  A radioactive seed was placed yesterday.  Description of procedure: She was injected with technetium sulfur colloid in the pre-op area.  A pectoral block was also placed.  The patient is brought to the operating room placed in supine position on the operating room table. After an adequate level of general anesthesia was obtained, her right breast and axilla was prepped with ChloraPrep and draped sterile fashion. A timeout was taken to ensure the proper patient and proper procedure. We interrogated the breast with the neoprobe. The seed is in the right upper outer quadrant very near the axilla.  We  made a transverse incision across the axilla after infiltrating with 0.25% Marcaine. Dissection was carried down in the breast tissue with cautery. We used the neoprobe to guide [place] towards the radioactive seed. We excised an area of tissue around the radioactive seed 2.5 cm in diameter.  The posterior margin is the pectoralis muscle. The specimen was removed and was oriented with a paint kit. Specimen mammogram showed the radioactive seed as well as the biopsy clip within the specimen.  This was sent for pathologic examination. There is no residual radioactivity within the biopsy cavity.   We then turned our attention laterally and changed the settings on the Neoprobe.  We immediately located a hot, blue lymph node with counts about 3000.  This sentinel lymph node was removed with cautery and marked as sentinel lymph node #1.  Another lymph node was located with counts of 400.  This was also removed and sent as SLN #2.  Background counts were minimal.    We inspected carefully for hemostasis. The wound was thoroughly irrigated. The wound was closed with a deep layer of 3-0 Vicryl and a subcuticular layer of 4-0 Monocryl. Benzoin Steri-Strips were applied.   We then took down our drapes and reprepped and draped in the left chest and neck.  The patient seems to have a very prominent thyroid gland, possible goiter that seems to extend across her left IJ.  Therefore, I decided to perform a subclavian vein placement.  The patient was positioned in Trendelenburg.  Her left subclavian vein was cannulated with an 18 gauge needle with good blood return.  The guidewire was passed and fluoroscopy confirmed that the wire passed down the right side of the mediastinum in the superior vena cava.  The needle was removed.  We made a subcutaneous pocket below the insertion site after infiltrating with .25% Marcaine.  An 8Fr ClearVue port was assembled and tunneled from the subcutaneous pocket to the insertion site.  The catheter was cut to length using fluoroscopy.  The dilator and breakaway sheath were passed over the wire.  The wire  and dilator were removed.  I then began passing the catheter through the sheath, but encountered some resistance behind the clavicle.  I reexamined the sheath with fluoroscopy.  We pulled the sheath back 1 cm, which then allowed the catheter to feed into the SVC.  The sheath was removed.  Blood was easily aspirated and the port flushed easily.  The port was sutured in place with  2-0 Prolene.  Concentrated heparin was instilled.  The wound was closed with 3-0 Vicryl and 4-0 Monocryl.  The insertion site was closed with 4-0 Monocryl.  Benzoin and steri-strips were applied.  The patient was then extubated and brought to the recovery room in stable condition. All sponge, instrument, and needle counts are correct.  Imogene Burn. Georgette Dover, MD, [facility]  General/ Trauma Surgery  08/11/2017 5:06 PM

## 2017-08-11 NOTE — Discharge Instructions (Signed)
[facility] °Office Phone Number [phone] ° °BREAST BIOPSY/ PARTIAL MASTECTOMY: POST OP INSTRUCTIONS ° °Always review your discharge instruction sheet given to you by the facility where your surgery was performed. ° °IF YOU HAVE DISABILITY OR FAMILY LEAVE FORMS, YOU MUST BRING THEM TO THE OFFICE FOR PROCESSING.  DO NOT GIVE THEM TO YOUR DOCTOR. ° °1. A prescription for pain medication may be given to you upon discharge.  Take your pain medication as prescribed, if needed.  If narcotic pain medicine is not needed, then you may take acetaminophen (Tylenol) or ibuprofen (Advil) as needed. °2. Take your usually prescribed medications unless otherwise directed °3. If you need a refill on your pain medication, please contact your pharmacy.  They will contact our office to request authorization.  Prescriptions will not be filled after 5pm or on week-ends. °4. You should eat very light the first 24 hours after surgery, such as soup, crackers, pudding, etc.  Resume your normal diet the day after surgery. °5. Most patients will experience some swelling and bruising in the breast.  Ice packs and a good support bra will help.  Swelling and bruising can take several days to resolve.  °6. It is common to experience some constipation if taking pain medication after surgery.  Increasing fluid intake and taking a stool softener will usually help or prevent this problem from occurring.  A mild laxative (Milk of Magnesia or Miralax) should be taken according to package directions if there are no bowel movements after 48 hours. °7. Unless discharge instructions indicate otherwise, you may remove your bandages 24-48 hours after surgery, and you may shower at that time.  You may have steri-strips (small skin tapes) in place directly over the incision.  These strips should be left on the skin for 7-10 days.  If your surgeon used skin glue on the incision, you may shower in 24 hours.  The glue will flake off over the  next 2-3 weeks.  Any sutures or staples will be removed at the office during your follow-up visit. °8. ACTIVITIES:  You may resume regular daily activities (gradually increasing) beginning the next day.  Wearing a good support bra or sports bra minimizes pain and swelling.  You may have sexual intercourse when it is comfortable. °a. You may drive when you no longer are taking prescription pain medication, you can comfortably wear a seatbelt, and you can safely maneuver your car and apply brakes. °b. RETURN TO WORK:  ______________________________________________________________________________________ °9. You should see your doctor in the office for a follow-up appointment approximately two weeks after your surgery.  Your doctor’s nurse will typically make your follow-up appointment when she calls you with your pathology report.  Expect your pathology report 2-3 business days after your surgery.  You may call to check if you do not hear from us after three days. °10. OTHER INSTRUCTIONS: _______________________________________________________________________________________________ _____________________________________________________________________________________________________________________________________ °_____________________________________________________________________________________________________________________________________ °_____________________________________________________________________________________________________________________________________ ° °WHEN TO CALL YOUR DOCTOR: °1. Fever over 101.0 °2. Nausea and/or vomiting. °3. Extreme swelling or bruising. °4. Continued bleeding from incision. °5. Increased pain, redness, or drainage from the incision. ° °The clinic staff is available to answer your questions during regular business hours.  Please don’t hesitate to call and ask to speak to one of the nurses for clinical concerns.  If you have a medical emergency, go to the nearest  emergency room or call 911.  A surgeon from [facility] is always on call at the hospital. ° °For further questions, please visit [email or web address]  ° ° ° ° ° ° °  PORT-A-CATH: POST OP INSTRUCTIONS  Always review your discharge instruction sheet given to you by the facility where your surgery was performed.   1. A prescription for pain medication may be given to you upon discharge. Take your pain medication as prescribed, if needed. If narcotic pain medicine is not needed, then you make take acetaminophen (Tylenol) or ibuprofen (Advil) as needed.  2. Take your usually prescribed medications unless otherwise directed. 3. If you need a refill on your pain medication, please contact our office. All narcotic pain medicine now requires a paper prescription.  Phoned in and fax refills are no longer allowed by law.  Prescriptions will not be filled after 5 pm or on weekends.  4. You should follow a light diet for the remainder of the day after your procedure. 5. Most patients will experience some mild swelling and/or bruising in the area of the incision. It may take several days to resolve. 6. It is common to experience some constipation if taking pain medication after surgery. Increasing fluid intake and taking a stool softener (such as Colace) will usually help or prevent this problem from occurring. A mild laxative (Milk of Magnesia or Miralax) should be taken according to package directions if there are no bowel movements after 48 hours.  7. Unless discharge instructions indicate otherwise, you may remove your bandages 4 days after surgery, and you may shower at that time. You may have steri-strips (small white skin tapes) in place directly over the incision.  These strips should be left on the skin for 7-10 days.   8. ACTIVITIES:  Limit activity involving your arms for the next 72 hours. Do no strenuous exercise or activity for 1 week. You may drive when you are no longer taking  prescription pain medication, you can comfortably wear a seatbelt, and you can maneuver your car.  9.When you receive a new Port-a-Cath, you will get a product guide and        ID card.  Please keep them in case you need them.  WHEN TO CALL YOUR DOCTOR [phone]): 1. Fever over 101.0 2. Chills 3. Continued bleeding from incision 4. Increased redness and tenderness at the site 5. Shortness of breath, difficulty breathing   The clinic staff is available to answer your questions during regular business hours. Please dont hesitate to call and ask to speak to one of the nurses or medical assistants for clinical concerns. If you have a medical emergency, go to the nearest emergency room or call 911.  A surgeon from [facility] is always on call at the hospital.     For further information, please visit [email or web address]   Post Anesthesia Home Care Instructions  Activity: Get plenty of rest for the remainder of the day. A responsible individual must stay with you for 24 hours following the procedure.  For the next 24 hours, DO NOT: -Drive a car -Paediatric nurse -Drink alcoholic beverages -Take any medication unless instructed by your physician -Make any legal decisions or sign important papers.  Meals: Start with liquid foods such as gelatin or soup. Progress to regular foods as tolerated. Avoid greasy, spicy, heavy foods. If nausea and/or vomiting occur, drink only clear liquids until the nausea and/or vomiting subsides. Call your physician if vomiting continues.  Special Instructions/Symptoms: Your throat may feel dry or sore from the anesthesia or the breathing tube placed in your throat during surgery. If this causes discomfort, gargle with warm salt water. The discomfort should disappear within 24 hours.  If you had a scopolamine patch placed behind your ear for the management of post- operative nausea and/or vomiting:  1. The medication in the  patch is effective for 72 hours, after which it should be removed.  Wrap patch in a tissue and discard in the trash. Wash hands thoroughly with soap and water. 2. You may remove the patch earlier than 72 hours if you experience unpleasant side effects which may include dry mouth, dizziness or visual disturbances. 3. Avoid touching the patch. Wash your hands with soap and water after contact with the patch.

## 2017-08-11 NOTE — H&P (View-Only) (Signed)
History of Present Illness Imogene Burn. Cathie Bonnell MD; 07/19/2017 1:34 PM) The patient is a 36 year old female who presents with breast cancer. PCP - [name]  Reason for consultation: New diagnosis right breast cancer This is a healthy 36 year old female who palpated a vague mass in the right upper outer quadrant of her breast near her axilla. She initially palpated this in March of this year. She brought this to the attention of her primary care physician in July. She was then referred for mammogram followed by ultrasound. 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 A nearby lymph node was mildly abnormal with thickened cortex. The lymph node was also biopsied and was benign. Prognostic pain was pending. She presents now for her initial surgical evaluation.  Menarche age 89 First pregnancy age 26. The patient has had 3 ectopic pregnancies and no live births Breastfeeding - No Hormones - she was on oral contraceptives for 7 years Last menstrual period 06/29/17 FH - negative  Recent abnormal PAP smear with some atypical cells   CLINICAL DATA: 36 year old female presenting for evaluation of a palpable mass in the right breast. She states she initially felt this around March of this year, but has not noted a difference since first identifying it.  EXAM: 2D DIGITAL DIAGNOSTIC BILATERAL MAMMOGRAM WITH CAD AND ADJUNCT TOMO  RIGHT BREAST ULTRASOUND  COMPARISON: None.  ACR Breast Density Category d: The breast tissue is extremely dense, which lowers the sensitivity of mammography.  FINDINGS: A BB has been placed on the upper-outer quadrant of the right breast indicating the palpable site of concern. On the spot compression tomosynthesis exaggerated right CC view there is an irregular mass with spiculated margins measuring approximately 1.4 cm. No other suspicious calcifications, masses or areas of distortion are seen in the  bilateral breasts.  Mammographic images were processed with CAD.  Physical exam of the palpable site in the upper-outer quadrant of the right breast demonstrates a subtle firm mass at approximately 1030, 8 cm from the nipple.  Ultrasound targeted to the palpable site at 10:30, 8 cm from the nipple in the right breast demonstrates an irregular hypoechoic mass with indistinct margins measuring 1.1 x 1.0 x 1.0 cm. Approximately 2.5 cm away from the mass, in the lower axilla is an abnormal appearing lymph node with a thickened neck a nodular cortex.  IMPRESSION: 1. There is an indeterminate mass at the palpable site in the right breast at 10:30. This could represent either an abnormal lymph node or a suspicious breast mass.  2. There is a suspicious lymph node in the right axilla.  3. No mammographic evidence of left breast malignancy.  RECOMMENDATION: Ultrasound-guided biopsy is recommended for the right breast mass and the right axillary lymph node. This has been scheduled for 07/15/2017 at 3:45 p.m.  I have discussed the findings and recommendations with the patient. Results were also provided in writing at the conclusion of the visit. If applicable, a reminder letter will be sent to the patient regarding the next appointment.  BI-RADS CATEGORY 4: Suspicious.   Electronically Signed By: Ammie Ferrier M.D. On: 07/09/2017 14:31  CLINICAL DATA: 36 year old female with right breast mass in mid right axillary lymph node.  EXAM: ULTRASOUND GUIDED RIGHT BREAST CORE NEEDLE BIOPSY  COMPARISON: Previous exam(s).  FINDINGS: I met with the patient and we discussed the procedure of ultrasound-guided biopsy, including benefits and alternatives. We discussed the high likelihood of a successful procedure. We  discussed the risks of the procedure, including infection, bleeding, tissue injury, clip migration, and inadequate sampling. Informed written consent was given.  The usual time-out protocol was performed immediately prior to the procedure.  Lesion quadrant: Upper-outer quadrant.  Using sterile technique and 1% Lidocaine as local anesthetic, under direct ultrasound visualization, a 14 gauge spring-loaded device was used to perform biopsy of a right breast mass at the 10 o'clock position 8 cm from the nipple. Using a lateral approach. At the conclusion of the procedure a ribbon shaped tissue marker clip was deployed into the biopsy cavity. Follow up 2 view mammogram was performed and dictated separately.  IMPRESSION: Ultrasound guided biopsy of a right breast mass. No apparent complications.  Electronically Signed: By: Kristopher Oppenheim M.D. On: 07/15/2017 16:57  CLINICAL DATA: 36 year old female with a right breast mass and indeterminate right axillary lymph node.  EXAM: ULTRASOUND GUIDED CORE NEEDLE BIOPSY OF A RIGHT AXILLARY NODE  COMPARISON: Previous exam(s).  FINDINGS: I met with the patient and we discussed the procedure of ultrasound-guided biopsy, including benefits and alternatives. We discussed the high likelihood of a successful procedure. We discussed the risks of the procedure, including infection, bleeding, tissue injury, clip migration, and inadequate sampling. Informed written consent was given. The usual time-out protocol was performed immediately prior to the procedure.  Using sterile technique and 1% Lidocaine as local anesthetic, under direct ultrasound visualization, a 14 gauge spring-loaded device was used to perform biopsy of a right axillary using a lateral approach. At the conclusion of the procedure a HydroMARK tissue marker clip was deployed into the biopsy cavity. Follow up 2 view mammogram was performed and dictated separately.  IMPRESSION: Ultrasound guided biopsy of a right axillary lymph node. No apparent complications.  Electronically Signed: By: Kristopher Oppenheim M.D. On: 07/15/2017  16:57  Diagnosis 1. Breast, right, needle core biopsy, UOQ - INVASIVE DUCTAL CARCINOMA, SEE COMMENT. 2. Lymph node, needle/core biopsy, right and axilla - BENIGN LYMPH NODE. - NO METASTATIC MALIGNANCY. Microscopic Comment 1. The carcinoma appears grade 3. Prognostic markers will be ordered. Dr. Lyndon Code has reviewed the case. The case was called to [facility] on 07/16/2017. Vicente Males MD Pathologist, Electronic Signature (Case signed 07/16/2017) Specimen Gross and Clinical Information Specimen Comment 1. TIF: 4:35 PM; extracted < 1 min; right breast mass 2. Indeterminate right axillary lymph node Specimen(s) Obtained: 1. Breast, right, needle core biopsy, UOQ 2. Lymph node, needle/core biopsy, right and axilla Specimen Clinical Information 1. Right breast mass 2. Indeterminate right axillary lymph node    Past Surgical History Malachy Moan, RMA; 07/19/2017 11:18 AM) Breast Biopsy Right. Colon Polyp Removal - Colonoscopy Oral Surgery  Diagnostic Studies History Malachy Moan, [place]; 07/19/2017 11:18 AM) Colonoscopy 5-10 years ago Mammogram within last year Pap Smear 1-5 years ago  Medication History Malachy Moan, RMA; 07/19/2017 11:20 AM) Clindamycin Phos-Benzoyl Perox (1.2-5% Gel, External) Active. Sulfacetamide Sodium-Sulfur (10-5% Emulsion, External) Active. Spironolactone (100MG Tablet, Oral) Active. Tretinoin (Emollient) (0.02% Cream, External) Active. Medications Reconciled  Social History Malachy Moan, [place]; 07/19/2017 11:18 AM) Alcohol use Occasional alcohol use. No caffeine use No drug use Tobacco use Never smoker.  Family History Malachy Moan, [place]; 07/19/2017 11:18 AM) Alcohol Abuse Family Members In General. Diabetes Mellitus Family Members In General. Heart Disease Family Members In General. Heart disease in female family member before age 44 Respiratory Condition Family Members In  General.  Pregnancy / Birth History Malachy Moan, [place]; 07/19/2017 11:18 AM) Age at menarche 31 years. Contraceptive History Oral  contraceptives. Gravida 3 Maternal age 35-20 Para 0 Regular periods  Other Problems Malachy Moan, [place]; 07/19/2017 11:18 AM) Breast Cancer Gastroesophageal Reflux Disease Lump In Breast     Review of Systems Malachy Moan RMA; 07/19/2017 11:18 AM) General Not Present- Appetite Loss, Chills, Fatigue, Fever, Night Sweats, Weight Gain and Weight Loss. Skin Not Present- Change in Wart/Mole, Dryness, Hives, Jaundice, New Lesions, Non-Healing Wounds, Rash and Ulcer. Respiratory Not Present- Bloody sputum, Chronic Cough, Difficulty Breathing, Snoring and Wheezing. Breast Present- Breast Mass. Not Present- Breast Pain, Nipple Discharge and Skin Changes. Cardiovascular Not Present- Chest Pain, Difficulty Breathing Lying Down, Leg Cramps, Palpitations, Rapid Heart Rate, Shortness of Breath and Swelling of Extremities. Gastrointestinal Not Present- Abdominal Pain, Bloating, Bloody Stool, Change in Bowel Habits, Chronic diarrhea, Constipation, Difficulty Swallowing, Excessive gas, Gets full quickly at meals, Hemorrhoids, Indigestion, Nausea, Rectal Pain and Vomiting. Female Genitourinary Not Present- Frequency, Nocturia, Painful Urination, Pelvic Pain and Urgency. Musculoskeletal Not Present- Back Pain, Joint Pain, Joint Stiffness, Muscle Pain, Muscle Weakness and Swelling of Extremities. Neurological Not Present- Decreased Memory, Fainting, Headaches, Numbness, Seizures, Tingling, Tremor, Trouble walking and Weakness. Psychiatric Not Present- Anxiety, Bipolar, Change in Sleep Pattern, Depression, Fearful and Frequent crying. Endocrine Not Present- Cold Intolerance, Excessive Hunger, Hair Changes, Heat Intolerance, Hot flashes and New Diabetes. Hematology Not Present- Blood Thinners, Easy Bruising, Excessive bleeding, Gland problems, HIV and Persistent  Infections.  Vitals Malachy Moan RMA; 07/19/2017 11:20 AM) 07/19/2017 11:20 AM Weight: 165.4 lb Height: 63in Body Surface Area: 1.78 m Body Mass Index: 29.3 kg/m  Temp.: 97.96F  Pulse: 96 (Regular)  BP: 110/80 (Sitting, Left Arm, Standard)      Physical Exam Rodman Key K. Roseana Rhine MD; 07/19/2017 1:35 PM)  The physical exam findings are as follows: Note:WDWN in NAD Eyes: Pupils equal, round; sclera anicteric HENT: Oral mucosa moist; good dentition Neck: No masses palpated, no thyromegaly Lungs: CTA bilaterally; normal respiratory effort Breasts: symmetric; bilateral chronic nipple retraction; no left breast masses; vague firmness near right axilla - no palpable lymph nodes CV: Regular rate and rhythm; no murmurs; extremities well-perfused with no edema Abd: +bowel sounds, soft, non-tender, no palpable organomegaly; no palpable hernias Skin: Warm, dry; no sign of jaundice Psychiatric - alert and oriented x 4; calm mood and affect    Assessment & Plan Rodman Key K. 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 Routine. Referred to Radiation Oncology, for evaluation and follow up (Radiation Oncology). Routine. Referred to Oncology, for evaluation and follow up (Oncology). Routine. Note:I spent approximately 1 hour with the patient and her husband answering their questions and discussing her disease and the recommended treatment pathways. At her young age, we will refer her to genetics for testing. We will also refer her to oncology for possible hormonal treatment prior to surgery if genetic testing will delay any treatment. We discussed the different surgical options including breast conserving therapy with right radioactive seed localized lumpectomy as well as right axillary sentinel lymph node biopsy. We will discuss the  lymph node further with radiology. If they feel that the biopsy is not concordant with the appearance of the lymph node, we may need to do a targeted lymph node dissection at the time of surgery.  We discussed possible surgical options of mastectomy with immediate reconstruction or without reconstruction. We also discussed the possibility of contralateral prophylactic mastectomy if her genetic tests come back  positive. The patient will go ahead and meet with genetics and we will discuss this further after that appointment and after breast cancer conference this week.  Imogene Burn. Georgette Dover, MD, [facility]  General/ Trauma Surgery  07/19/2017 1:38 PM

## 2017-08-11 NOTE — Progress Notes (Signed)
Nuclear Medicine at bedside.  Patient tolerated injection well. She denies pain.  Family at bedside.  Will offer emotional support to patient.

## 2017-08-11 NOTE — Anesthesia Procedure Notes (Signed)
Anesthesia Regional Block: Pectoralis block   Pre-Anesthetic Checklist: ,, timeout performed, Correct Patient, Correct Site, Correct Laterality, Correct Procedure, Correct Position, site marked, Risks and benefits discussed,  Surgical consent,  Pre-op evaluation,  At surgeon's request and post-op pain management  Laterality: Right  Prep: chloraprep       Needles:  Injection technique: Single-shot  Needle Type: Echogenic Needle     Needle Length: 9cm  Needle Gauge: 21     Additional Needles:   Narrative:  Start time: 08/11/2017 1:28 PM End time: 08/11/2017 1:36 PM Injection made incrementally with aspirations every 5 mL.  Performed by: Personally  Anesthesiologist: Ravynn Hogate  Additional Notes: Pt tolerated the procedure well.

## 2017-08-11 NOTE — Anesthesia Procedure Notes (Addendum)
Procedure Name: LMA Insertion Date/Time: 08/11/2017 3:00 PM Performed by: Verita Lamb Pre-anesthesia Checklist: Patient identified, Emergency Drugs available, Suction available, Patient being monitored and Timeout performed Patient Re-evaluated:Patient Re-evaluated prior to induction Preoxygenation: Pre-oxygenation with 100% oxygen Induction Type: IV induction LMA: LMA inserted LMA Size: 3.0 Tube type: Oral Number of attempts: 1 Airway Equipment and Method: Patient positioned with wedge pillow Placement Confirmation: CO2 detector,  positive ETCO2 and breath sounds checked- equal and bilateral Tube secured with: Tape Dental Injury: Teeth and Oropharynx as per pre-operative assessment

## 2017-08-11 NOTE — Anesthesia Preprocedure Evaluation (Signed)
Anesthesia Evaluation  Patient identified by MRN, date of birth, ID band Patient awake    Reviewed: Allergy & Precautions, H&P , NPO status , Patient's Chart, lab work & pertinent test results  Airway Mallampati: II   Neck ROM: full    Dental   Pulmonary neg pulmonary [name],    breath sounds clear to auscultation       Cardiovascular negative cardio [name]   Rhythm:regular Rate:Normal     Neuro/Psych    GI/Hepatic IBS   Endo/Other    Renal/GU      Musculoskeletal   Abdominal   Peds  Hematology   Anesthesia Other Findings   Reproductive/Obstetrics Breast CA                             Anesthesia Physical Anesthesia Plan  ASA: II  Anesthesia Plan: General   Post-op Pain Management:  Regional for Post-op pain   Induction: Intravenous  PONV Risk Score and Plan: 3 and Ondansetron, Dexamethasone, Midazolam and Treatment may vary due to age or medical condition  Airway Management Planned: LMA  Additional Equipment:   Intra-op Plan:   Post-operative Plan:   Informed Consent: I have reviewed the patients History and Physical, chart, labs and discussed the procedure including the risks, benefits and alternatives for the proposed anesthesia with the patient or authorized representative who has indicated his/her understanding and acceptance.     Plan Discussed with: CRNA, Anesthesiologist and Surgeon  Anesthesia Plan Comments:         Anesthesia Quick Evaluation

## 2017-08-11 NOTE — Transfer of Care (Signed)
Immediate Anesthesia Transfer of Care Note  Patient: Brianna [name]  Procedure(s) Performed: Procedure(s): RIGHT BREAST LUMPECTOMY WITH RADIOACTIVE SEED AND RIGHT SENTINEL LYMPH NODE BIOPSY ERAS PATHWAY (Right) INSERTION PORT-A-CATH WITH ULTRA SOUND (Left)  Patient Location: PACU  Anesthesia Type:General  Level of Consciousness: awake  Airway & Oxygen Therapy: Patient Spontanous Breathing and Patient connected to face mask oxygen  Post-op Assessment: Report given to RN and Post -op Vital signs reviewed and stable  Post vital signs: Reviewed and stable  Last Vitals:  Vitals:   08/11/17 1337 08/11/17 1338  BP: 105/69 101/69  Pulse: 71 75  Resp: 12 (!) 9  Temp:    SpO2: 97% 99%    Last Pain:  Vitals:   08/11/17 1338  TempSrc:   PainSc: 0-No pain         Complications: No apparent anesthesia complications

## 2017-08-11 NOTE — Progress Notes (Signed)
  Assisted Dr. Hodierne with right, ultrasound guided, pectoralis block. Side rails up, monitors on throughout procedure. See vital signs in flow sheet. Tolerated Procedure well. 

## 2017-08-12 ENCOUNTER — Encounter: Payer: Self-pay | Admitting: Family Medicine

## 2017-08-12 ENCOUNTER — Encounter (HOSPITAL_BASED_OUTPATIENT_CLINIC_OR_DEPARTMENT_OTHER): Payer: Self-pay | Admitting: Surgery

## 2017-08-12 NOTE — Anesthesia Postprocedure Evaluation (Signed)
Anesthesia Post Note  Patient: Brianna [name]  Procedure(s) Performed: Procedure(s) (LRB): RIGHT BREAST LUMPECTOMY WITH RADIOACTIVE SEED AND RIGHT SENTINEL LYMPH NODE BIOPSY (Right) INSERTION PORT-A-CATH WITH ULTRA SOUND (Left)     Patient location during evaluation: PACU Anesthesia Type: General Level of consciousness: awake and alert Pain management: pain level controlled Vital Signs Assessment: post-procedure vital signs reviewed and stable Respiratory status: spontaneous breathing, nonlabored ventilation, respiratory function stable and patient connected to nasal cannula oxygen Cardiovascular status: blood pressure returned to baseline and stable Postop Assessment: no apparent nausea or vomiting Anesthetic complications: no    Last Vitals:  Vitals:   08/11/17 1730 08/11/17 1745  BP: 114/77 109/75  Pulse: 67 68  Resp: 14 16  Temp:    SpO2: 100% 100%    Last Pain:  Vitals:   08/11/17 1745  TempSrc:   PainSc: 2                  Ronnesha Mester S

## 2017-08-13 ENCOUNTER — Ambulatory Visit (HOSPITAL_COMMUNITY)
Admission: RE | Admit: 2017-08-13 | Discharge: 2017-08-13 | Disposition: A | Discharge status: Home / Self Care | Payer: 59 | Source: Ambulatory Visit | Attending: Hematology and Oncology | Admitting: Hematology and Oncology

## 2017-08-13 DIAGNOSIS — Z170 Estrogen receptor positive status [ER+]: Secondary | ICD-10-CM | POA: Diagnosis not present

## 2017-08-13 DIAGNOSIS — C50411 Malignant neoplasm of upper-outer quadrant of right female breast: Secondary | ICD-10-CM | POA: Insufficient documentation

## 2017-08-13 NOTE — Progress Notes (Signed)
  Echocardiogram 2D Echocardiogram has been performed.  Floella Ensz L Androw 08/13/2017, 10:44 AM

## 2017-08-17 ENCOUNTER — Telehealth: Payer: Self-pay | Admitting: Hematology and Oncology

## 2017-08-17 ENCOUNTER — Ambulatory Visit (HOSPITAL_BASED_OUTPATIENT_CLINIC_OR_DEPARTMENT_OTHER): Payer: 59 | Admitting: Hematology and Oncology

## 2017-08-17 DIAGNOSIS — Z170 Estrogen receptor positive status [ER+]: Secondary | ICD-10-CM | POA: Diagnosis not present

## 2017-08-17 DIAGNOSIS — C50411 Malignant neoplasm of upper-outer quadrant of right female breast: Secondary | ICD-10-CM

## 2017-08-17 NOTE — Progress Notes (Signed)
Patient Care Team: [place], [name], MD as PCP - General (Family Medicine)  DIAGNOSIS:  Encounter Diagnosis  Name Primary?  . Malignant neoplasm of upper-outer quadrant of right breast in female, estrogen receptor positive (HCC)     SUMMARY OF ONCOLOGIC HISTORY:   Malignant neoplasm of upper-outer quadrant of right breast in female, estrogen receptor positive (HCC)   07/15/2017 Initial Diagnosis    Patient palpated a week mass in the right upper outer quadrant breast near the axilla in March 2018 mammogram revealed irregular spiculated mass 1.4 cm, by ultrasound measured 1.1 cm with a suspicious right axillary lymph node; biopsy revealed IDC grade 3, lymph node benign, ER 90%, PR 0%, Ki-67 40%, HER-2 positive ratio 2.6, T1c N0 stage IA, AJCC 8 clinical stage      08/06/2017 Genetic Testing    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  The Hereditary Gene Panel offered by [facility] includes sequencing and/or deletion duplication testing of the following 46 genes: APC, ATM, AXIN2, BARD1, BMPR1A, BRCA1, BRCA2, BRIP1, CDH1, CDKN2A (p14ARF), CDKN2A (p16INK4a), CHEK2, CTNNA1, DICER1, EPCAM (Deletion/duplication testing only), GREM1 (promoter region deletion/duplication testing only), KIT, MEN1, MLH1, MSH2, MSH3, MSH6, MUTYH, NBN, NF1, NHTL1, PALB2, PDGFRA, PMS2, POLD1, POLE, PTEN, RAD50, RAD51C, RAD51D, SDHB, SDHC, SDHD, SMAD4, SMARCA4. STK11, TP53, TSC1, TSC2, and VHL.  The following genes were evaluated for sequence changes only: SDHA and HOXB13 c.251G>A variant only.  The report date is August 06, 2017.      08/11/2017 Surgery    Right lumpectomy: IDC with DCIS, 1.4 cm, grade 3, DCIS focally involving posterior margin, 0/3 lymph nodes negative, ER 90%, PR 0%, HER-2 positive ratio 2.6, Ki-67 40% T1 CN 0 stage IA       CHIEF COMPLIANT: Follow-up after recent right lumpectomy  INTERVAL HISTORY: Brianna [name] is a 36 year old with above-mentioned history  of right breast cancer treated with lumpectomy and is here today to discuss the pathology report. She is accompanied by her husband. She is healing extremely well from the recent surgery.  REVIEW OF SYSTEMS:   Constitutional: Denies fevers, chills or abnormal weight loss Eyes: Denies blurriness of vision Ears, nose, mouth, throat, and face: Denies mucositis or sore throat Respiratory: Denies cough, dyspnea or wheezes Cardiovascular: Denies palpitation, chest discomfort Gastrointestinal:  Denies nausea, heartburn or change in bowel habits Skin: Denies abnormal skin rashes Lymphatics: Denies new lymphadenopathy or easy bruising Neurological:Denies numbness, tingling or new weaknesses Behavioral/Psych: Mood is stable, no new changes  Extremities: No lower extremity edema Breast:  denies any pain or lumps or nodules in either breasts All other systems were reviewed with the patient and are negative.  I have reviewed the past medical history, past surgical history, social history and family history with the patient and they are unchanged from previous note.  ALLERGIES:  has No Known Allergies.  MEDICATIONS:  Current Outpatient Prescriptions  Medication Sig Dispense Refill  . Clindamycin-Benzoyl Per, Refr, gel Apply to face once daily in the morning.    Marland Kitchen HYDROcodone-acetaminophen (NORCO/VICODIN) 5-325 MG tablet Take 1 tablet by mouth every 6 (six) hours as needed for moderate pain. 20 tablet 0  . naproxen sodium (ANAPROX) 220 MG tablet Take 220 mg by mouth as needed.    Marland Kitchen spironolactone (ALDACTONE) 100 MG tablet Take 100 mg by mouth daily.    . Sulfacetamide Sodium-Sulfur (CLENIA FOAMING WASH) 10-5 % EMUL Apply topically.    . tretinoin (RETIN-A) 0.025 % gel Apply to face nightly  No current facility-administered medications for this visit.     PHYSICAL EXAMINATION: ECOG PERFORMANCE STATUS: 1 - Symptomatic but completely ambulatory  Vitals:   08/17/17 1506  BP: 111/72  Pulse: 96   Resp: 18  Temp: 98.7 F (37.1 C)  SpO2: 100%   Filed Weights   08/17/17 1506  Weight: 166 lb 3.2 oz (75.4 kg)    GENERAL:alert, no distress and comfortable SKIN: skin color, texture, turgor are normal, no rashes or significant lesions EYES: normal, Conjunctiva are pink and non-injected, sclera clear OROPHARYNX:no exudate, no erythema and lips, buccal mucosa, and tongue normal  NECK: supple, thyroid normal size, non-tender, without nodularity LYMPH:  no palpable lymphadenopathy in the cervical, axillary or inguinal LUNGS: clear to auscultation and percussion with normal breathing effort HEART: regular rate & rhythm and no murmurs and no lower extremity edema ABDOMEN:abdomen soft, non-tender and normal bowel sounds MUSCULOSKELETAL:no cyanosis of digits and no clubbing  NEURO: alert & oriented x 3 with fluent speech, no focal motor/sensory deficits EXTREMITIES: No lower extremity edema BREAST: Healing very well from recent surgeries. No palpable axillary supraclavicular or infraclavicular adenopathy no breast tenderness or nipple discharge. (exam performed in the presence of a chaperone)  LABORATORY DATA:  I have reviewed the data as listed   Chemistry      Component Value Date/Time   NA 135 08/06/2017 1517   K 4.7 08/06/2017 1517   CL 103 08/06/2017 1517   CO2 28 08/06/2017 1517   BUN 9 08/06/2017 1517   CREATININE 0.88 08/06/2017 1517      Component Value Date/Time   CALCIUM 9.2 08/06/2017 1517   AST 15 07/03/2013 1216       Lab Results  Component Value Date   WBC 6.0 07/03/2013   HGB 12.5 07/03/2013   HCT 36.7 07/03/2013   MCV 89.5 07/03/2013   PLT 277 07/03/2013   NEUTROABS 2.8 07/03/2013    ASSESSMENT & PLAN:  Malignant neoplasm of upper-outer quadrant of right breast in female, estrogen receptor positive ([place]) Right lumpectomy: IDC with DCIS, 1.4 cm, grade 3, DCIS focally involving posterior margin, 0/3 lymph nodes negative, ER 90%, PR 0%, HER-2 positive  ratio 2.6, Ki-67 40% T1 CN 0 stage IA  Pathology counseling: I discussed the final pathology report of the patient provided  a copy of this report. I discussed the margins as well as lymph node surgeries. We also discussed the final staging along with previously performed ER/PR and HER-2/neu testing.  Recommendation: 1. Resection of the positive margin 2. adjuvant chemotherapy with TCH followed by Herceptin maintenance for a year  3. Followed by adjuvant radiation 4. Followed by adjuvant antiestrogen therapy with tamoxifen that was started prior to surgery  Chemotherapy Counseling: I discussed the risks and benefits of chemotherapy including the risks of nausea/ vomiting, risk of infection from low WBC count, fatigue due to chemo or anemia, bruising or bleeding due to low platelets, mouth sores, loss/ change in taste and decreased appetite. Liver and kidney function will be monitored through out chemotherapy as abnormalities in liver and kidney function may be a side effect of treatment. Cardiac dysfunction due to Herceptin was discussed in detail. Risk of permanent bone marrow dysfunction and leukemia due to chemo were also discussed.  She will need to be set up for chemotherapy education Patient will bring paperwork for intermittent FMLA  Return to clinic to begin chemotherapy on 09/07/2017   I spent 25 minutes talking to the patient of which more than half  was spent in counseling and coordination of care.  No orders of the defined types were placed in this encounter.  The patient has a good understanding of the overall plan. she agrees with it. she will call with any problems that may develop before the next visit here.   Rulon Eisenmenger, MD 08/17/17

## 2017-08-17 NOTE — Telephone Encounter (Signed)
Gave patient avs and calendar with appts. Per 9/25 los

## 2017-08-17 NOTE — Assessment & Plan Note (Signed)
Right lumpectomy: IDC with DCIS, 1.4 cm, grade 3, DCIS focally involving posterior margin, 0/3 lymph nodes negative, ER 90%, PR 0%, HER-2 positive ratio 2.6, Ki-67 40% T1 CN 0 stage IA  Pathology counseling: I discussed the final pathology report of the patient provided  a copy of this report. I discussed the margins as well as lymph node surgeries. We also discussed the final staging along with previously performed ER/PR and HER-2/neu testing.  Recommendation: 1. Resection of the positive margin 2. adjuvant chemotherapy with TCH followed by Herceptin maintenance for a year  3. Followed by adjuvant radiation 4. Followed by adjuvant antiestrogen therapy with tamoxifen that was started prior to surgery  Return to clinic to begin chemotherapy

## 2017-08-18 ENCOUNTER — Telehealth: Payer: Self-pay | Admitting: Hematology and Oncology

## 2017-08-18 NOTE — Telephone Encounter (Signed)
Spoke with patient regarding the changes in her schedule per 9/26 sch msg

## 2017-08-19 ENCOUNTER — Encounter: Payer: Self-pay | Admitting: *Deleted

## 2017-08-19 ENCOUNTER — Other Ambulatory Visit: Payer: 59

## 2017-08-20 ENCOUNTER — Other Ambulatory Visit: Payer: Self-pay | Admitting: Hematology and Oncology

## 2017-08-20 DIAGNOSIS — C50411 Malignant neoplasm of upper-outer quadrant of right female breast: Secondary | ICD-10-CM

## 2017-08-20 DIAGNOSIS — Z170 Estrogen receptor positive status [ER+]: Principal | ICD-10-CM

## 2017-08-20 MED ORDER — LORAZEPAM 0.5 MG PO TABS: 0.5000 mg | ORAL_TABLET | Freq: Every day | ORAL | 0 refills | Status: DC

## 2017-08-20 MED ORDER — ONDANSETRON HCL 8 MG PO TABS: 8.0000 mg | ORAL_TABLET | Freq: Two times a day (BID) | ORAL | 1 refills | Status: DC | PRN

## 2017-08-20 MED ORDER — PROCHLORPERAZINE MALEATE 10 MG PO TABS: 10.0000 mg | ORAL_TABLET | Freq: Four times a day (QID) | ORAL | 1 refills | Status: DC | PRN

## 2017-08-20 MED ORDER — LIDOCAINE-PRILOCAINE 2.5-2.5 % EX CREA: TOPICAL_CREAM | CUTANEOUS | 3 refills | Status: DC

## 2017-08-20 MED ORDER — DEXAMETHASONE 4 MG PO TABS: 4.0000 mg | ORAL_TABLET | Freq: Every day | ORAL | 0 refills | Status: DC

## 2017-08-20 NOTE — Progress Notes (Signed)
START ON PATHWAY REGIMEN - Breast   Docetaxel + Carboplatin + Trastuzumab [facility]):   A cycle is every 21 days:     Trastuzumab      Trastuzumab      Docetaxel      Carboplatin   **Always confirm dose/schedule in your pharmacy ordering system**    Trastuzumab (Maintenance Post TCH) x 11 Cycles:   A cycle is every 21 days:     Trastuzumab   **Always confirm dose/schedule in your pharmacy ordering system**    Patient Characteristics: Postoperative without Neoadjuvant Therapy (Pathologic Staging), Invasive Disease, Adjuvant Therapy, HER2 Positive, ER Positive, Node Negative, pT1c, pN0/N41m Therapeutic Status: Postoperative without Neoadjuvant Therapy (Pathologic Staging) AJCC Grade: G3 AJCC N Category: pN0 AJCC M Category: cM0 ER Status: Positive (+) AJCC 8 Stage Grouping: IA HER2 Status: Positive (+) Oncotype Dx Recurrence Score: Not Appropriate AJCC T Category: pT1c PR Status: Negative (-) Intent of Therapy: Curative Intent, Discussed with Patient

## 2017-08-26 ENCOUNTER — Telehealth: Payer: Self-pay | Admitting: Hematology and Oncology

## 2017-08-26 NOTE — Telephone Encounter (Signed)
Attempted to call patient regarding the addition of her lab per 10/3 sch msg. Was not able because the phone call did not work - so I am sending her a confirmation letter.

## 2017-09-07 ENCOUNTER — Ambulatory Visit: Payer: 59

## 2017-09-07 ENCOUNTER — Ambulatory Visit (HOSPITAL_BASED_OUTPATIENT_CLINIC_OR_DEPARTMENT_OTHER): Payer: 59

## 2017-09-07 ENCOUNTER — Other Ambulatory Visit (HOSPITAL_BASED_OUTPATIENT_CLINIC_OR_DEPARTMENT_OTHER): Payer: 59

## 2017-09-07 ENCOUNTER — Encounter: Payer: Self-pay | Admitting: *Deleted

## 2017-09-07 VITALS — BP 108/70 | HR 86 | Temp 99.6°F | Resp 18

## 2017-09-07 DIAGNOSIS — Z95828 Presence of other vascular implants and grafts: Secondary | ICD-10-CM

## 2017-09-07 DIAGNOSIS — C50411 Malignant neoplasm of upper-outer quadrant of right female breast: Secondary | ICD-10-CM | POA: Diagnosis not present

## 2017-09-07 DIAGNOSIS — Z5111 Encounter for antineoplastic chemotherapy: Secondary | ICD-10-CM

## 2017-09-07 DIAGNOSIS — Z170 Estrogen receptor positive status [ER+]: Principal | ICD-10-CM

## 2017-09-07 DIAGNOSIS — Z5112 Encounter for antineoplastic immunotherapy: Secondary | ICD-10-CM

## 2017-09-07 LAB — CBC WITH DIFFERENTIAL/PLATELET
BASO%: 0.2 % (ref 0.0–2.0)
Basophils Absolute: 0 10*3/uL (ref 0.0–0.1)
EOS%: 0.2 % (ref 0.0–7.0)
Eosinophils Absolute: 0 10*3/uL (ref 0.0–0.5)
HEMATOCRIT: 40 % (ref 34.8–46.6)
HGB: 13.2 g/dL (ref 11.6–15.9)
LYMPH#: 1.2 10*3/uL (ref 0.9–3.3)
LYMPH%: 30.3 % (ref 14.0–49.7)
MCH: 30.3 pg (ref 25.1–34.0)
MCHC: 33 g/dL (ref 31.5–36.0)
MCV: 92 fL (ref 79.5–101.0)
MONO#: 0.1 10*3/uL (ref 0.1–0.9)
MONO%: 1.7 % (ref 0.0–14.0)
NEUT%: 67.6 % (ref 38.4–76.8)
NEUTROS ABS: 2.8 10*3/uL (ref 1.5–6.5)
PLATELETS: 287 10*3/uL (ref 145–400)
RBC: 4.35 10*6/uL (ref 3.70–5.45)
RDW: 13.5 % (ref 11.2–14.5)
WBC: 4.1 10*3/uL (ref 3.9–10.3)

## 2017-09-07 LAB — COMPREHENSIVE METABOLIC PANEL
ALT: 14 U/L (ref 0–55)
ANION GAP: 7 meq/L (ref 3–11)
AST: 16 U/L (ref 5–34)
Albumin: 3.9 g/dL (ref 3.5–5.0)
Alkaline Phosphatase: 59 U/L (ref 40–150)
BILIRUBIN TOTAL: 0.32 mg/dL (ref 0.20–1.20)
BUN: 13.4 mg/dL (ref 7.0–26.0)
CALCIUM: 9.5 mg/dL (ref 8.4–10.4)
CO2: 24 meq/L (ref 22–29)
CREATININE: 0.9 mg/dL (ref 0.6–1.1)
Chloride: 105 mEq/L (ref 98–109)
EGFR: >60 mL/min/{1.73_m2} (ref >60)
Glucose: 98 mg/dl (ref 70–140)
Potassium: 4.3 mEq/L (ref 3.5–5.1)
Sodium: 136 mEq/L (ref 136–145)
TOTAL PROTEIN: 7.6 g/dL (ref 6.4–8.3)

## 2017-09-07 MED ORDER — PALONOSETRON HCL INJECTION 0.25 MG/5ML
INTRAVENOUS | Status: AC
  Filled 2017-09-07: qty 5

## 2017-09-07 MED ORDER — SODIUM CHLORIDE 0.9 % IV SOLN
Freq: Once | INTRAVENOUS | Status: AC
  Administered 2017-09-07: 13:00:00 via INTRAVENOUS

## 2017-09-07 MED ORDER — SODIUM CHLORIDE 0.9 % IV SOLN
700.0000 mg | Freq: Once | INTRAVENOUS | Status: AC
  Administered 2017-09-07: 700 mg via INTRAVENOUS
  Filled 2017-09-07: qty 70

## 2017-09-07 MED ORDER — DEXAMETHASONE SODIUM PHOSPHATE 10 MG/ML IJ SOLN
INTRAMUSCULAR | Status: AC
  Filled 2017-09-07: qty 1

## 2017-09-07 MED ORDER — PEGFILGRASTIM 6 MG/0.6ML ~~LOC~~ PSKT
6.0000 mg | PREFILLED_SYRINGE | Freq: Once | SUBCUTANEOUS | Status: AC
  Administered 2017-09-07: 6 mg via SUBCUTANEOUS
  Filled 2017-09-07: qty 0.6

## 2017-09-07 MED ORDER — ACETAMINOPHEN 325 MG PO TABS
650.0000 mg | ORAL_TABLET | Freq: Once | ORAL | Status: AC
  Administered 2017-09-07: 650 mg via ORAL

## 2017-09-07 MED ORDER — PALONOSETRON HCL INJECTION 0.25 MG/5ML
0.2500 mg | Freq: Once | INTRAVENOUS | Status: AC
Start: 2017-09-07 — End: 2017-09-07
  Administered 2017-09-07: 0.25 mg via INTRAVENOUS

## 2017-09-07 MED ORDER — SODIUM CHLORIDE 0.9 % IV SOLN
75.0000 mg/m2 | Freq: Once | INTRAVENOUS | Status: AC
  Administered 2017-09-07: 140 mg via INTRAVENOUS
  Filled 2017-09-07: qty 14

## 2017-09-07 MED ORDER — DEXAMETHASONE SODIUM PHOSPHATE 10 MG/ML IJ SOLN
10.0000 mg | Freq: Once | INTRAMUSCULAR | Status: AC
  Administered 2017-09-07: 10 mg via INTRAVENOUS

## 2017-09-07 MED ORDER — TRASTUZUMAB CHEMO 150 MG IV SOLR
600.0000 mg | Freq: Once | INTRAVENOUS | Status: AC
  Administered 2017-09-07: 600 mg via INTRAVENOUS
  Filled 2017-09-07: qty 28.57

## 2017-09-07 MED ORDER — HEPARIN SOD (PORK) LOCK FLUSH 100 UNIT/ML IV SOLN
500.0000 [IU] | Freq: Once | INTRAVENOUS | Status: AC | PRN
  Administered 2017-09-07: 500 [IU]
  Filled 2017-09-07: qty 5

## 2017-09-07 MED ORDER — ACETAMINOPHEN 325 MG PO TABS
ORAL_TABLET | ORAL | Status: AC
  Filled 2017-09-07: qty 2

## 2017-09-07 MED ORDER — DIPHENHYDRAMINE HCL 25 MG PO CAPS
ORAL_CAPSULE | ORAL | Status: AC
  Filled 2017-09-07: qty 2

## 2017-09-07 MED ORDER — SODIUM CHLORIDE 0.9% FLUSH
10.0000 mL | INTRAVENOUS | Status: DC | PRN
  Administered 2017-09-07: 10 mL via INTRAVENOUS
  Filled 2017-09-07: qty 10

## 2017-09-07 MED ORDER — DIPHENHYDRAMINE HCL 25 MG PO CAPS
50.0000 mg | ORAL_CAPSULE | Freq: Once | ORAL | Status: AC
  Administered 2017-09-07: 50 mg via ORAL

## 2017-09-07 MED ORDER — SODIUM CHLORIDE 0.9% FLUSH
10.0000 mL | INTRAVENOUS | Status: DC | PRN
  Administered 2017-09-07: 10 mL
  Filled 2017-09-07: qty 10

## 2017-09-07 NOTE — Progress Notes (Signed)
Discharge and neulasta instructions reviewed with the patient. Patient verbalized understanding.

## 2017-09-07 NOTE — Patient Instructions (Signed)
[facility] Discharge Instructions for Patients Receiving Chemotherapy  Today you received the following chemotherapy agents: Herceptin, Docetaxel and Carboplatin   To help prevent nausea and vomiting after your treatment, we encourage you to take your nausea medication as directed.  If you develop nausea and vomiting that is not controlled by your nausea medication, call the clinic.   BELOW ARE SYMPTOMS THAT SHOULD BE REPORTED IMMEDIATELY:  *FEVER GREATER THAN 100.5 F  *CHILLS WITH OR WITHOUT FEVER  NAUSEA AND VOMITING THAT IS NOT CONTROLLED WITH YOUR NAUSEA MEDICATION  *UNUSUAL SHORTNESS OF BREATH  *UNUSUAL BRUISING OR BLEEDING  TENDERNESS IN MOUTH AND THROAT WITH OR WITHOUT PRESENCE OF ULCERS  *URINARY PROBLEMS  *BOWEL PROBLEMS  UNUSUAL RASH Items with * indicate a potential emergency and should be followed up as soon as possible.  Feel free to call the clinic should you have any questions or concerns. The clinic phone number is [phone].  Please show the [facility] at check-in to the Emergency Department and triage nurse.  Trastuzumab injection for infusion (Herceptin) What is this medicine? TRASTUZUMAB (tras TOO zoo mab) is a monoclonal antibody. It is used to treat breast cancer and stomach cancer. This medicine may be used for other purposes; ask your health care provider or pharmacist if you have questions. COMMON BRAND NAME(S): Herceptin What should I tell my health care provider before I take this medicine? They need to know if you have any of these conditions: -heart disease -heart failure -lung or breathing disease, like asthma -an unusual or allergic reaction to trastuzumab, benzyl alcohol, or other medications, foods, dyes, or preservatives -pregnant or trying to get pregnant -breast-feeding How should I use this medicine? This drug is given as an infusion into a vein. It is administered in a hospital or clinic by a specially  trained health care professional. Talk to your pediatrician regarding the use of this medicine in children. This medicine is not approved for use in children. Overdosage: If you think you have taken too much of this medicine contact a poison control center or emergency room at once. NOTE: This medicine is only for you. Do not share this medicine with others. What if I miss a dose? It is important not to miss a dose. Call your doctor or health care professional if you are unable to keep an appointment. What may interact with this medicine? This medicine may interact with the following medications: -certain types of chemotherapy, such as daunorubicin, doxorubicin, epirubicin, and idarubicin This list may not describe all possible interactions. Give your health care provider a list of all the medicines, herbs, non-prescription drugs, or dietary supplements you use. Also tell them if you smoke, drink alcohol, or use illegal drugs. Some items may interact with your medicine. What should I watch for while using this medicine? Visit your doctor for checks on your progress. Report any side effects. Continue your course of treatment even though you feel ill unless your doctor tells you to stop. Call your doctor or health care professional for advice if you get a fever, chills or sore throat, or other symptoms of a cold or flu. Do not treat yourself. Try to avoid being around people who are sick. You may experience fever, chills and shaking during your first infusion. These effects are usually mild and can be treated with other medicines. Report any side effects during the infusion to your health care professional. Fever and chills usually do not happen with later infusions. Do  not become pregnant while taking this medicine or for 7 months after stopping it. Women should inform their doctor if they wish to become pregnant or think they might be pregnant. Women of child-bearing potential will need to have a  negative pregnancy test before starting this medicine. There is a potential for serious side effects to an unborn child. Talk to your health care professional or pharmacist for more information. Do not breast-feed an infant while taking this medicine or for 7 months after stopping it. Women must use effective birth control with this medicine. What side effects may I notice from receiving this medicine? Side effects that you should report to your doctor or health care professional as soon as possible: -allergic reactions like skin rash, itching or hives, swelling of the face, lips, or tongue -chest pain or palpitations -cough -dizziness -feeling faint or lightheaded, falls -fever -general ill feeling or flu-like symptoms -signs of worsening heart failure like breathing problems; swelling in your legs and feet -unusually weak or tired Side effects that usually do not require medical attention (report to your doctor or health care professional if they continue or are bothersome): -bone pain -changes in taste -diarrhea -joint pain -nausea/vomiting -weight loss This list may not describe all possible side effects. Call your doctor for medical advice about side effects. You may report side effects to FDA at 1-800-FDA-1088. Where should I keep my medicine? This drug is given in a hospital or clinic and will not be stored at home. NOTE: This sheet is a summary. It may not cover all possible information. If you have questions about this medicine, talk to your doctor, pharmacist, or health care provider.  2018 Elsevier/Gold Standard (2016-11-03 14:37:52)  Docetaxel injection What is this medicine? DOCETAXEL (doe se TAX el) is a chemotherapy drug. It targets fast dividing cells, like cancer cells, and causes these cells to die. This medicine is used to treat many types of cancers like breast cancer, certain stomach cancers, head and neck cancer, lung cancer, and prostate cancer. This medicine may  be used for other purposes; ask your health care provider or pharmacist if you have questions. COMMON BRAND NAME(S): Docefrez, Taxotere What should I tell my health care provider before I take this medicine? They need to know if you have any of these conditions: -infection (especially a virus infection such as chickenpox, cold sores, or herpes) -liver disease -low blood counts, like low white cell, platelet, or red cell counts -an unusual or allergic reaction to docetaxel, polysorbate 80, other chemotherapy agents, other medicines, foods, dyes, or preservatives -pregnant or trying to get pregnant -breast-feeding How should I use this medicine? This drug is given as an infusion into a vein. It is administered in a hospital or clinic by a specially trained health care professional. Talk to your pediatrician regarding the use of this medicine in children. Special care may be needed. Overdosage: If you think you have taken too much of this medicine contact a poison control center or emergency room at once. NOTE: This medicine is only for you. Do not share this medicine with others. What if I miss a dose? It is important not to miss your dose. Call your doctor or health care professional if you are unable to keep an appointment. What may interact with this medicine? -cyclosporine -erythromycin -ketoconazole -medicines to increase blood counts like filgrastim, pegfilgrastim, sargramostim -vaccines Talk to your doctor or health care professional before taking any of these medicines: -acetaminophen -aspirin -ibuprofen -ketoprofen -naproxen This list  may not describe all possible interactions. Give your health care provider a list of all the medicines, herbs, non-prescription drugs, or dietary supplements you use. Also tell them if you smoke, drink alcohol, or use illegal drugs. Some items may interact with your medicine. What should I watch for while using this medicine? Your condition will be  monitored carefully while you are receiving this medicine. You will need important blood work done while you are taking this medicine. This drug may make you feel generally unwell. This is not uncommon, as chemotherapy can affect healthy cells as well as cancer cells. Report any side effects. Continue your course of treatment even though you feel ill unless your doctor tells you to stop. In some cases, you may be given additional medicines to help with side effects. Follow all directions for their use. Call your doctor or health care professional for advice if you get a fever, chills or sore throat, or other symptoms of a cold or flu. Do not treat yourself. This drug decreases your body's ability to fight infections. Try to avoid being around people who are sick. This medicine may increase your risk to bruise or bleed. Call your doctor or health care professional if you notice any unusual bleeding. This medicine may contain alcohol in the product. You may get drowsy or dizzy. Do not drive, use machinery, or do anything that needs mental alertness until you know how this medicine affects you. Do not stand or sit up quickly, especially if you are an older patient. This reduces the risk of dizzy or fainting spells. Avoid alcoholic drinks. Do not become pregnant while taking this medicine. Women should inform their doctor if they wish to become pregnant or think they might be pregnant. There is a potential for serious side effects to an unborn child. Talk to your health care professional or pharmacist for more information. Do not breast-feed an infant while taking this medicine. What side effects may I notice from receiving this medicine? Side effects that you should report to your doctor or health care professional as soon as possible: -allergic reactions like skin rash, itching or hives, swelling of the face, lips, or tongue -low blood counts - This drug may decrease the number of white blood cells, red  blood cells and platelets. You may be at increased risk for infections and bleeding. -signs of infection - fever or chills, cough, sore throat, pain or difficulty passing urine -signs of decreased platelets or bleeding - bruising, pinpoint red spots on the skin, black, tarry stools, nosebleeds -signs of decreased red blood cells - unusually weak or tired, fainting spells, lightheadedness -breathing problems -fast or irregular heartbeat -low blood pressure -mouth sores -nausea and vomiting -pain, swelling, redness or irritation at the injection site -pain, tingling, numbness in the hands or feet -swelling of the ankle, feet, hands -weight gain Side effects that usually do not require medical attention (report to your doctor or health care professional if they continue or are bothersome): -bone pain -complete hair loss including hair on your head, underarms, pubic hair, eyebrows, and eyelashes -diarrhea -excessive tearing -changes in the color of fingernails -loosening of the fingernails -nausea -muscle pain -red flush to skin -sweating -weak or tired This list may not describe all possible side effects. Call your doctor for medical advice about side effects. You may report side effects to FDA at 1-800-FDA-1088. Where should I keep my medicine? This drug is given in a hospital or clinic and will not be stored at  home. NOTE: This sheet is a summary. It may not cover all possible information. If you have questions about this medicine, talk to your doctor, pharmacist, or health care provider.  2018 Elsevier/Gold Standard (2015-12-12 12:32:56)  Carboplatin injection What is this medicine? CARBOPLATIN (KAR boe pla tin) is a chemotherapy drug. It targets fast dividing cells, like cancer cells, and causes these cells to die. This medicine is used to treat ovarian cancer and many other cancers. This medicine may be used for other purposes; ask your health care provider or pharmacist if you  have questions. COMMON BRAND NAME(S): Paraplatin What should I tell my health care provider before I take this medicine? They need to know if you have any of these conditions: -blood disorders -hearing problems -kidney disease -recent or ongoing radiation therapy -an unusual or allergic reaction to carboplatin, cisplatin, other chemotherapy, other medicines, foods, dyes, or preservatives -pregnant or trying to get pregnant -breast-feeding How should I use this medicine? This drug is usually given as an infusion into a vein. It is administered in a hospital or clinic by a specially trained health care professional. Talk to your pediatrician regarding the use of this medicine in children. Special care may be needed. Overdosage: If you think you have taken too much of this medicine contact a poison control center or emergency room at once. NOTE: This medicine is only for you. Do not share this medicine with others. What if I miss a dose? It is important not to miss a dose. Call your doctor or health care professional if you are unable to keep an appointment. What may interact with this medicine? -medicines for seizures -medicines to increase blood counts like filgrastim, pegfilgrastim, sargramostim -some antibiotics like amikacin, gentamicin, neomycin, streptomycin, tobramycin -vaccines Talk to your doctor or health care professional before taking any of these medicines: -acetaminophen -aspirin -ibuprofen -ketoprofen -naproxen This list may not describe all possible interactions. Give your health care provider a list of all the medicines, herbs, non-prescription drugs, or dietary supplements you use. Also tell them if you smoke, drink alcohol, or use illegal drugs. Some items may interact with your medicine. What should I watch for while using this medicine? Your condition will be monitored carefully while you are receiving this medicine. You will need important blood work done while you  are taking this medicine. This drug may make you feel generally unwell. This is not uncommon, as chemotherapy can affect healthy cells as well as cancer cells. Report any side effects. Continue your course of treatment even though you feel ill unless your doctor tells you to stop. In some cases, you may be given additional medicines to help with side effects. Follow all directions for their use. Call your doctor or health care professional for advice if you get a fever, chills or sore throat, or other symptoms of a cold or flu. Do not treat yourself. This drug decreases your body's ability to fight infections. Try to avoid being around people who are sick. This medicine may increase your risk to bruise or bleed. Call your doctor or health care professional if you notice any unusual bleeding. Be careful brushing and flossing your teeth or using a toothpick because you may get an infection or bleed more easily. If you have any dental work done, tell your dentist you are receiving this medicine. Avoid taking products that contain aspirin, acetaminophen, ibuprofen, naproxen, or ketoprofen unless instructed by your doctor. These medicines may hide a fever. Do not become pregnant while taking  this medicine. Women should inform their doctor if they wish to become pregnant or think they might be pregnant. There is a potential for serious side effects to an unborn child. Talk to your health care professional or pharmacist for more information. Do not breast-feed an infant while taking this medicine. What side effects may I notice from receiving this medicine? Side effects that you should report to your doctor or health care professional as soon as possible: -allergic reactions like skin rash, itching or hives, swelling of the face, lips, or tongue -signs of infection - fever or chills, cough, sore throat, pain or difficulty passing urine -signs of decreased platelets or bleeding - bruising, pinpoint red spots on  the skin, black, tarry stools, nosebleeds -signs of decreased red blood cells - unusually weak or tired, fainting spells, lightheadedness -breathing problems -changes in hearing -changes in vision -chest pain -high blood pressure -low blood counts - This drug may decrease the number of white blood cells, red blood cells and platelets. You may be at increased risk for infections and bleeding. -nausea and vomiting -pain, swelling, redness or irritation at the injection site -pain, tingling, numbness in the hands or feet -problems with balance, talking, walking -trouble passing urine or change in the amount of urine Side effects that usually do not require medical attention (report to your doctor or health care professional if they continue or are bothersome): -hair loss -loss of appetite -metallic taste in the mouth or changes in taste This list may not describe all possible side effects. Call your doctor for medical advice about side effects. You may report side effects to FDA at 1-800-FDA-1088. Where should I keep my medicine? This drug is given in a hospital or clinic and will not be stored at home. NOTE: This sheet is a summary. It may not cover all possible information. If you have questions about this medicine, talk to your doctor, pharmacist, or health care provider.  2018 Elsevier/Gold Standard (2008-02-14 14:38:05)

## 2017-09-08 ENCOUNTER — Telehealth: Payer: Self-pay

## 2017-09-08 NOTE — Telephone Encounter (Signed)
Called pt to do a chemo follow up call. Pt states that she is doing well so far. Reviewed all home medications to manage her side effects and symptoms. Reinforced oral hydration and nutrition and when to call for any concerns. Pt states that she was having extreme difficulty with sleeping at night. Pt took ativan 0.5 after dinner last night and had to take another before midnight and finally fell asleep. Told pt that insomnia can be a side effect of taking steroids. Clarified how she needs to take her steroids at home. Pt thought that she had to take steroid daily. Pt verbalized updated instructions on how to correctly take medication. Pt has no concerns at this time. Pt appreciative of call.

## 2017-09-13 ENCOUNTER — Encounter: Payer: Self-pay | Admitting: Hematology and Oncology

## 2017-09-13 NOTE — Progress Notes (Signed)
Called patient to introduce myself as her Arboriculturist and to ask if she had any financial questions or concerns regarding her treatment.  Patient unsure if she has met her ded/OOP for insurance for the year. Advised patient she may contact her insurance to find out specifics to date on this concern.  Advised patient that she may apply for copay assistance through [employer] for [place] for Herceptin. Asked patient if she would like for me to apply on her behalf and she states yes. Also, discussed Advertising account executive with patient. Asked for estimated verbal income for her household. She states she is a household of 2 and her income exceeds the limit for the [employer] and [employer] copay assistance which funds just opened today. Advised patient if she experiences a reduction in household income, to let me know and we can revisit the grant and possible other assistance. She verbalized understanding.  I will meet patient tomorrow in person to give my card and discuss two approvals.

## 2017-09-14 ENCOUNTER — Encounter: Payer: Self-pay | Admitting: Adult Health

## 2017-09-14 ENCOUNTER — Other Ambulatory Visit (HOSPITAL_BASED_OUTPATIENT_CLINIC_OR_DEPARTMENT_OTHER): Payer: 59

## 2017-09-14 ENCOUNTER — Ambulatory Visit (HOSPITAL_BASED_OUTPATIENT_CLINIC_OR_DEPARTMENT_OTHER): Payer: 59 | Admitting: Adult Health

## 2017-09-14 ENCOUNTER — Encounter: Payer: Self-pay | Admitting: *Deleted

## 2017-09-14 ENCOUNTER — Ambulatory Visit: Payer: 59 | Admitting: Hematology and Oncology

## 2017-09-14 DIAGNOSIS — C50411 Malignant neoplasm of upper-outer quadrant of right female breast: Secondary | ICD-10-CM

## 2017-09-14 DIAGNOSIS — Z170 Estrogen receptor positive status [ER+]: Secondary | ICD-10-CM | POA: Diagnosis not present

## 2017-09-14 LAB — COMPREHENSIVE METABOLIC PANEL
ALBUMIN: 3.8 g/dL (ref 3.5–5.0)
ALK PHOS: 79 U/L (ref 40–150)
ALT: 21 U/L (ref 0–55)
ANION GAP: 7 meq/L (ref 3–11)
AST: 20 U/L (ref 5–34)
BILIRUBIN TOTAL: 0.22 mg/dL (ref 0.20–1.20)
BUN: 9 mg/dL (ref 7.0–26.0)
CALCIUM: 9.2 mg/dL (ref 8.4–10.4)
CO2: 26 mEq/L (ref 22–29)
Chloride: 102 mEq/L (ref 98–109)
Creatinine: 0.9 mg/dL (ref 0.6–1.1)
GLUCOSE: 119 mg/dL (ref 70–140)
Potassium: 3.9 mEq/L (ref 3.5–5.1)
Sodium: 136 mEq/L (ref 136–145)
TOTAL PROTEIN: 7.2 g/dL (ref 6.4–8.3)

## 2017-09-14 LAB — CBC WITH DIFFERENTIAL/PLATELET
BASO%: 0.1 % (ref 0.0–2.0)
Basophils Absolute: 0 10*3/uL (ref 0.0–0.1)
EOS ABS: 0 10*3/uL (ref 0.0–0.5)
EOS%: 0.1 % (ref 0.0–7.0)
HEMATOCRIT: 40.1 % (ref 34.8–46.6)
HEMOGLOBIN: 13 g/dL (ref 11.6–15.9)
LYMPH#: 4 10*3/uL — AB (ref 0.9–3.3)
LYMPH%: 30.3 % (ref 14.0–49.7)
MCH: 30.2 pg (ref 25.1–34.0)
MCHC: 32.5 g/dL (ref 31.5–36.0)
MCV: 92.9 fL (ref 79.5–101.0)
MONO#: 1.8 10*3/uL — AB (ref 0.1–0.9)
MONO%: 13.9 % (ref 0.0–14.0)
NEUT%: 55.6 % (ref 38.4–76.8)
NEUTROS ABS: 7.3 10*3/uL — AB (ref 1.5–6.5)
PLATELETS: 203 10*3/uL (ref 145–400)
RBC: 4.32 10*6/uL (ref 3.70–5.45)
RDW: 13.8 % (ref 11.2–14.5)
WBC: 13.1 10*3/uL — AB (ref 3.9–10.3)

## 2017-09-14 NOTE — Progress Notes (Signed)
[facility] Follow up:    Brianna [name], Brianna [name], [place] [name]   DIAGNOSIS: Cancer Staging Malignant neoplasm of upper-outer quadrant of right breast in female, estrogen receptor positive ([place]) Staging form: Breast, AJCC 8th Edition - Pathologic: Stage IA (pT1c, pN0, cM0, G3, ER: Positive, PR: Negative, HER2: Positive) - Signed by Gardenia Phlegm, NP on 08/25/2017   SUMMARY OF ONCOLOGIC HISTORY:   Malignant neoplasm of upper-outer quadrant of right breast in female, estrogen receptor positive ([place])   07/15/2017 Initial Diagnosis    Patient palpated a week mass in the right upper outer quadrant breast near the axilla in March 2018 mammogram revealed irregular spiculated mass 1.4 cm, by ultrasound measured 1.1 cm with a suspicious right axillary lymph node; biopsy revealed IDC grade 3, lymph node benign, ER 90%, PR 0%, Ki-67 40%, HER-2 positive ratio 2.6, T1c N0 stage IA, AJCC 8 clinical stage      08/06/2017 Genetic Testing    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  The Hereditary Gene Panel offered by [facility] includes sequencing and/or deletion duplication testing of the following 46 genes: APC, ATM, AXIN2, BARD1, BMPR1A, BRCA1, BRCA2, BRIP1, CDH1, CDKN2A (p14ARF), CDKN2A (p16INK4a), CHEK2, CTNNA1, DICER1, EPCAM (Deletion/duplication testing only), GREM1 (promoter region deletion/duplication testing only), KIT, MEN1, MLH1, MSH2, MSH3, MSH6, MUTYH, NBN, NF1, NHTL1, PALB2, PDGFRA, PMS2, POLD1, POLE, PTEN, RAD50, RAD51C, RAD51D, SDHB, SDHC, SDHD, SMAD4, SMARCA4. STK11, TP53, TSC1, TSC2, and VHL.  The following genes were evaluated for sequence changes only: SDHA and HOXB13 c.251G>A variant only.  The report date is August 06, 2017.      08/11/2017 Surgery    Right lumpectomy: IDC with DCIS, 1.4 cm, grade 3, DCIS focally involving posterior margin, 0/3 lymph nodes negative, ER 90%, PR 0%, HER-2  positive ratio 2.6, Ki-67 40% T1 CN 0 stage IA      09/07/2017 -  Adjuvant Chemotherapy    TCH x 6, then maintenance Herceptin x 1 year       CURRENT THERAPY: TCH cycle 1 day 8  INTERVAL HISTORY: Brianna [name] 36 y.o. female returns for evaluation following her first cycle of [place].  She tolerated treatment well.  She was unsure of how to take her anti-emetics, so she took Dexamethasone daily starting the day before chemotherapy and says she is nearly out.  She has had some indigestion and constipation as well.  She would like something written about her anti-emetics.  She and her mom also want to hear directly from Dr. Lindi Adie about the recommendation from breast conference about her positive margin.     Patient Active Problem List   Diagnosis Date Noted  . Genetic testing 08/09/2017  . Malignant neoplasm of upper-outer quadrant of right breast in female, estrogen receptor positive ([place]) 07/22/2017  . IBS (irritable bowel syndrome) 05/25/2017  . Prediabetes 05/25/2017  . Acne vulgaris 12/18/2015    has No Known Allergies.  MEDICAL HISTORY: Past Medical History:  Diagnosis Date  . Acne   . History of concussion 08/2016  . IBS (irritable bowel syndrome)    no current med.  . Invasive ductal carcinoma of breast, right ([place]) 07/2017  . Pre-diabetes     SURGICAL HISTORY: Past Surgical History:  Procedure Laterality Date  . BREAST LUMPECTOMY WITH RADIOACTIVE SEED AND SENTINEL LYMPH NODE BIOPSY Right 08/11/2017   Procedure: RIGHT BREAST LUMPECTOMY WITH RADIOACTIVE SEED AND RIGHT SENTINEL LYMPH NODE BIOPSY;  Surgeon: Donnie Mesa,  MD;  Location: [facility];  Service: General;  Laterality: Right;  ERAS PATHWAY  . PORTACATH PLACEMENT Left 08/11/2017   Procedure: INSERTION PORT-A-CATH WITH ULTRA SOUND;  Surgeon: Manus Rudd, MD;  Location: [facility];  Service: General;  Laterality: Left;  . WISDOM TOOTH EXTRACTION      SOCIAL HISTORY: Social  History   Social History  . Marital status: Married    Spouse name: N/A  . Number of children: N/A  . Years of education: N/A   Occupational History  . Not on file.   Social History Main Topics  . Smoking status: Never Smoker  . Smokeless tobacco: Never Used  . Alcohol use Yes     Comment: occasionally  . Drug use: No  . Sexual activity: No   Other Topics Concern  . Not on file   Social History Narrative  . No narrative on file    FAMILY HISTORY: Family History  Problem Relation Age of Onset  . Heart disease Maternal Grandmother        d.53  . Stroke Maternal Grandfather   . Diabetes Maternal Grandfather   . Hyperlipidemia Mother   . Diabetes Maternal Uncle   . Heart attack Other   . Obesity Other   . COPD Other   . Asthma Other   . Breast cancer Other 69       Maternal grandfather's sister    Review of Systems  Constitutional: Negative for appetite change, chills, fatigue and fever.  HENT:   Negative for hearing loss and lump/mass.   Eyes: Negative for eye problems and icterus.  Respiratory: Negative for chest tightness, cough and shortness of breath.   Cardiovascular: Negative for chest pain, leg swelling and palpitations.  Gastrointestinal: Positive for constipation. Negative for abdominal distention, abdominal pain, diarrhea, nausea and vomiting.  Endocrine: Negative for hot flashes.  Skin: Negative for itching and rash.  Neurological: Negative for dizziness, extremity weakness, headaches and numbness.  Hematological: Negative for adenopathy. Does not bruise/bleed easily.  Psychiatric/Behavioral: Negative for depression. The patient is not nervous/anxious.       PHYSICAL EXAMINATION  ECOG PERFORMANCE STATUS: 1 - Symptomatic but completely ambulatory  Vitals:   09/14/17 1516  BP: 124/70  Pulse: 92  Resp: 18  Temp: 98.8 F (37.1 C)  SpO2: 100%    Physical Exam  Constitutional: She is oriented to person, place, and time and well-developed,  well-nourished, and in no distress.  HENT:  Head: Normocephalic and atraumatic.  Mouth/Throat: Oropharynx is clear and moist. No oropharyngeal exudate.  Eyes: Pupils are equal, round, and reactive to light. No scleral icterus.  Neck: Neck supple.  Cardiovascular: Normal rate, regular rhythm and normal heart sounds.   Pulmonary/Chest: Effort normal and breath sounds normal.  Abdominal: Soft. Bowel sounds are normal.  Musculoskeletal: She exhibits no edema.  Lymphadenopathy:    She has no cervical adenopathy.  Neurological: She is alert and oriented to person, place, and time.  Skin: Skin is warm and dry. No rash noted.  Psychiatric: Mood and affect normal.    LABORATORY DATA:  CBC    Component Value Date/Time   WBC 13.1 (H) 09/14/2017 1501   WBC 6.0 07/03/2013 1216   RBC 4.32 09/14/2017 1501   RBC 4.10 07/03/2013 1216   HGB 13.0 09/14/2017 1501   HCT 40.1 09/14/2017 1501   PLT 203 09/14/2017 1501   MCV 92.9 09/14/2017 1501   MCH 30.2 09/14/2017 1501   MCH 30.5 07/03/2013  1216   MCHC 32.5 09/14/2017 1501   MCHC 34.1 07/03/2013 1216   RDW 13.8 09/14/2017 1501   LYMPHSABS 4.0 (H) 09/14/2017 1501   MONOABS 1.8 (H) 09/14/2017 1501   EOSABS 0.0 09/14/2017 1501   BASOSABS 0.0 09/14/2017 1501    CMP     Component Value Date/Time   NA 136 09/14/2017 1501   K 3.9 09/14/2017 1501   CL 103 08/06/2017 1517   CO2 26 09/14/2017 1501   GLUCOSE 119 09/14/2017 1501   BUN 9.0 09/14/2017 1501   CREATININE 0.9 09/14/2017 1501   CALCIUM 9.2 09/14/2017 1501   PROT 7.2 09/14/2017 1501   ALBUMIN 3.8 09/14/2017 1501   AST 20 09/14/2017 1501   ALT 21 09/14/2017 1501   ALKPHOS 79 09/14/2017 1501   BILITOT 0.22 09/14/2017 1501   GFRNONAA >60 08/06/2017 1517   GFRAA >60 08/06/2017 1517      ASSESSMENT and PLAN:   Malignant neoplasm of upper-outer quadrant of right breast in female, estrogen receptor positive (HCC) Right lumpectomy: IDC with DCIS, 1.4 cm, grade 3, DCIS focally  involving posterior margin, 0/3 lymph nodes negative, ER 90%, PR 0%, HER-2 positive ratio 2.6, Ki-67 40% T1 CN 0 stage IA  Pathology counseling: I discussed the final pathology report of the patient provided  a copy of this report. I discussed the margins as well as lymph node surgeries. We also discussed the final staging along with previously performed ER/PR and HER-2/neu testing.  Recommendation: 1. Resection of the positive margin 2. adjuvant chemotherapy with TCH followed by Herceptin maintenance for a year  3. Followed by adjuvant radiation 4. Followed by adjuvant antiestrogen therapy with tamoxifen that was started prior to surgery  __________________________________________________________________________________  Patient tolerated her first cycle of [place] well and without any difficulty.  I reviewed that she didn't get Perjeta because she had stage IA breast cancer.  I gave them a "map" for her anti-emetic regimen.  They want to hear from Dr. Lindi Adie if he agrees with radiation of the positive posterior margin.  They have not yet heard this from him, and would like a phone call with his recommendation. I also suggested that she take Colace on days she takes her nausea medication to keep her bowels moving.    Brianna [name] will return in 2 weeks for labs, f/u with Dr. Lindi Adie, and cycle 2 of [facility].       All questions were answered. The patient knows to call the clinic with any problems, questions or concerns. We can certainly see the patient much sooner if necessary.  A total of (30) minutes of face-to-face time was spent with this patient with greater than 50% of that time in counseling and care-coordination.  This note was electronically signed. Scot Dock, NP 09/14/2017

## 2017-09-14 NOTE — Assessment & Plan Note (Addendum)
Right lumpectomy: IDC with DCIS, 1.4 cm, grade 3, DCIS focally involving posterior margin, 0/3 lymph nodes negative, ER 90%, PR 0%, HER-2 positive ratio 2.6, Ki-67 40% T1 CN 0 stage IA  Pathology counseling: I discussed the final pathology report of the patient provided  a copy of this report. I discussed the margins as well as lymph node surgeries. We also discussed the final staging along with previously performed ER/PR and HER-2/neu testing.  Recommendation: 1. Resection of the positive margin 2. adjuvant chemotherapy with TCH followed by Herceptin maintenance for a year  3. Followed by adjuvant radiation 4. Followed by adjuvant antiestrogen therapy with tamoxifen that was started prior to surgery  __________________________________________________________________________________  Patient tolerated her first cycle of [place] well and without any difficulty.  I reviewed that she didn't get Perjeta because she had stage IA breast cancer.  I gave them a "map" for her anti-emetic regimen.  They want to hear from Dr. Lindi Adie if he agrees with radiation of the positive posterior margin.  They have not yet heard this from him, and would like a phone call with his recommendation. I also suggested that she take Colace on days she takes her nausea medication to keep her bowels moving.    Jubilee will return in 2 weeks for labs, f/u with Dr. Lindi Adie, and cycle 2 of [facility]

## 2017-09-14 NOTE — Progress Notes (Signed)
Met with patient and mother to advise of approval for [employer] for Neulasta and [place] for Herceptin. Patient was appreciative and verbalized understanding. Advised her she would receive approval letters in the mail as well.  Neulasta approved for up to $10,000 for one calendar year covering the first treamtnet at 100% and each additional injection would only leave her a $25 copay .  Herceptin approved for $25,000 09/13/17-09/12/18 and will only leave her with a $5 copay.  These will remain active as long as she remains commercially insured.  Gave her my card for any additional financial questions or concerns.

## 2017-09-15 ENCOUNTER — Telehealth: Payer: Self-pay | Admitting: Hematology and Oncology

## 2017-09-15 ENCOUNTER — Telehealth: Payer: Self-pay | Admitting: Adult Health

## 2017-09-15 NOTE — Telephone Encounter (Signed)
Spoke with patient and followed up on question of FMLA paperwork status.  Patient stated she heard from her HR dept. today that FMLA paperwork was received. Returned papers to HIM to be scanned into chart.

## 2017-09-15 NOTE — Telephone Encounter (Signed)
Per 10/23 - no los at checkout °

## 2017-09-15 NOTE — Telephone Encounter (Signed)
I spoke to the patient about the tumor board discussion that she does not need any further surgery. The only side effect of chemotherapy she is experienced is fatigue as well as vivid and frightful dreams .

## 2017-09-27 ENCOUNTER — Other Ambulatory Visit: Payer: Self-pay | Admitting: Surgery

## 2017-09-27 DIAGNOSIS — I82611 Acute embolism and thrombosis of superficial veins of right upper extremity: Secondary | ICD-10-CM | POA: Insufficient documentation

## 2017-09-27 NOTE — Assessment & Plan Note (Signed)
Right upper extremity venous duplex

## 2017-09-28 ENCOUNTER — Ambulatory Visit (HOSPITAL_BASED_OUTPATIENT_CLINIC_OR_DEPARTMENT_OTHER): Payer: 59

## 2017-09-28 ENCOUNTER — Other Ambulatory Visit (HOSPITAL_BASED_OUTPATIENT_CLINIC_OR_DEPARTMENT_OTHER): Payer: 59

## 2017-09-28 ENCOUNTER — Ambulatory Visit (HOSPITAL_BASED_OUTPATIENT_CLINIC_OR_DEPARTMENT_OTHER): Payer: 59 | Admitting: Hematology and Oncology

## 2017-09-28 ENCOUNTER — Telehealth: Payer: Self-pay | Admitting: Hematology and Oncology

## 2017-09-28 ENCOUNTER — Ambulatory Visit: Payer: 59

## 2017-09-28 DIAGNOSIS — L658 Other specified nonscarring hair loss: Secondary | ICD-10-CM

## 2017-09-28 DIAGNOSIS — Z5111 Encounter for antineoplastic chemotherapy: Secondary | ICD-10-CM | POA: Diagnosis not present

## 2017-09-28 DIAGNOSIS — Z95828 Presence of other vascular implants and grafts: Secondary | ICD-10-CM

## 2017-09-28 DIAGNOSIS — C50411 Malignant neoplasm of upper-outer quadrant of right female breast: Secondary | ICD-10-CM

## 2017-09-28 DIAGNOSIS — Z170 Estrogen receptor positive status [ER+]: Secondary | ICD-10-CM | POA: Diagnosis not present

## 2017-09-28 DIAGNOSIS — R530 Neoplastic (malignant) related fatigue: Secondary | ICD-10-CM | POA: Diagnosis not present

## 2017-09-28 DIAGNOSIS — Z5112 Encounter for antineoplastic immunotherapy: Secondary | ICD-10-CM

## 2017-09-28 LAB — COMPREHENSIVE METABOLIC PANEL WITH GFR
ALT: 17 U/L (ref 0–55)
AST: 13 U/L (ref 5–34)
Albumin: 3.6 g/dL (ref 3.5–5.0)
Alkaline Phosphatase: 67 U/L (ref 40–150)
Anion Gap: 7 meq/L (ref 3–11)
BUN: 13.1 mg/dL (ref 7.0–26.0)
CO2: 25 meq/L (ref 22–29)
Calcium: 9.2 mg/dL (ref 8.4–10.4)
Chloride: 105 meq/L (ref 98–109)
Creatinine: 0.9 mg/dL (ref 0.6–1.1)
EGFR: >60 ml/min/1.73 m2
Glucose: 87 mg/dL (ref 70–140)
Potassium: 3.5 meq/L (ref 3.5–5.1)
Sodium: 137 meq/L (ref 136–145)
Total Bilirubin: 0.22 mg/dL (ref 0.20–1.20)
Total Protein: 7.2 g/dL (ref 6.4–8.3)

## 2017-09-28 LAB — CBC WITH DIFFERENTIAL/PLATELET
BASO%: 0.4 % (ref 0.0–2.0)
Basophils Absolute: 0 10e3/uL (ref 0.0–0.1)
EOS%: 0.4 % (ref 0.0–7.0)
Eosinophils Absolute: 0 10e3/uL (ref 0.0–0.5)
HCT: 34.3 % — ABNORMAL LOW (ref 34.8–46.6)
HGB: 11.3 g/dL — ABNORMAL LOW (ref 11.6–15.9)
LYMPH%: 41.3 % (ref 14.0–49.7)
MCH: 30.5 pg (ref 25.1–34.0)
MCHC: 32.9 g/dL (ref 31.5–36.0)
MCV: 92.7 fL (ref 79.5–101.0)
MONO#: 0.9 10e3/uL (ref 0.1–0.9)
MONO%: 9.5 % (ref 0.0–14.0)
NEUT#: 4.6 10e3/uL (ref 1.5–6.5)
NEUT%: 48.4 % (ref 38.4–76.8)
Platelets: 335 10e3/uL (ref 145–400)
RBC: 3.7 10e6/uL (ref 3.70–5.45)
RDW: 14.6 % — ABNORMAL HIGH (ref 11.2–14.5)
WBC: 9.5 10e3/uL (ref 3.9–10.3)
lymph#: 3.9 10e3/uL — ABNORMAL HIGH (ref 0.9–3.3)

## 2017-09-28 MED ORDER — SODIUM CHLORIDE 0.9 % IV SOLN
Freq: Once | INTRAVENOUS | Status: AC
  Administered 2017-09-28: 13:00:00 via INTRAVENOUS

## 2017-09-28 MED ORDER — PALONOSETRON HCL INJECTION 0.25 MG/5ML
INTRAVENOUS | Status: AC
  Filled 2017-09-28: qty 5

## 2017-09-28 MED ORDER — DIPHENHYDRAMINE HCL 25 MG PO CAPS
ORAL_CAPSULE | ORAL | Status: AC
  Filled 2017-09-28: qty 2

## 2017-09-28 MED ORDER — SODIUM CHLORIDE 0.9% FLUSH
10.0000 mL | INTRAVENOUS | Status: DC | PRN
  Administered 2017-09-28: 10 mL via INTRAVENOUS
  Filled 2017-09-28: qty 10

## 2017-09-28 MED ORDER — HEPARIN SOD (PORK) LOCK FLUSH 100 UNIT/ML IV SOLN
500.0000 [IU] | Freq: Once | INTRAVENOUS | Status: AC | PRN
  Administered 2017-09-28: 500 [IU]
  Filled 2017-09-28: qty 5

## 2017-09-28 MED ORDER — ACETAMINOPHEN 325 MG PO TABS
ORAL_TABLET | ORAL | Status: AC
  Filled 2017-09-28: qty 2

## 2017-09-28 MED ORDER — SODIUM CHLORIDE 0.9 % IV SOLN
450.0000 mg | Freq: Once | INTRAVENOUS | Status: AC
  Administered 2017-09-28: 450 mg via INTRAVENOUS
  Filled 2017-09-28: qty 21.43

## 2017-09-28 MED ORDER — PALONOSETRON HCL INJECTION 0.25 MG/5ML
0.2500 mg | Freq: Once | INTRAVENOUS | Status: AC
  Administered 2017-09-28: 0.25 mg via INTRAVENOUS

## 2017-09-28 MED ORDER — DIPHENHYDRAMINE HCL 25 MG PO CAPS
50.0000 mg | ORAL_CAPSULE | Freq: Once | ORAL | Status: AC
  Administered 2017-09-28: 50 mg via ORAL

## 2017-09-28 MED ORDER — PEGFILGRASTIM 6 MG/0.6ML ~~LOC~~ PSKT
6.0000 mg | PREFILLED_SYRINGE | Freq: Once | SUBCUTANEOUS | Status: AC
  Administered 2017-09-28: 6 mg via SUBCUTANEOUS
  Filled 2017-09-28: qty 0.6

## 2017-09-28 MED ORDER — SODIUM CHLORIDE 0.9 % IV SOLN
700.0000 mg | Freq: Once | INTRAVENOUS | Status: AC
  Administered 2017-09-28: 700 mg via INTRAVENOUS
  Filled 2017-09-28: qty 70

## 2017-09-28 MED ORDER — DEXAMETHASONE SODIUM PHOSPHATE 10 MG/ML IJ SOLN
10.0000 mg | Freq: Once | INTRAMUSCULAR | Status: AC
  Administered 2017-09-28: 10 mg via INTRAVENOUS

## 2017-09-28 MED ORDER — SODIUM CHLORIDE 0.9% FLUSH
10.0000 mL | INTRAVENOUS | Status: DC | PRN
  Administered 2017-09-28: 10 mL
  Filled 2017-09-28: qty 10

## 2017-09-28 MED ORDER — ACETAMINOPHEN 325 MG PO TABS
650.0000 mg | ORAL_TABLET | Freq: Once | ORAL | Status: AC
  Administered 2017-09-28: 650 mg via ORAL

## 2017-09-28 MED ORDER — SODIUM CHLORIDE 0.9 % IV SOLN
75.0000 mg/m2 | Freq: Once | INTRAVENOUS | Status: AC
  Administered 2017-09-28: 140 mg via INTRAVENOUS
  Filled 2017-09-28: qty 14

## 2017-09-28 MED ORDER — DEXAMETHASONE SODIUM PHOSPHATE 10 MG/ML IJ SOLN
INTRAMUSCULAR | Status: AC
  Filled 2017-09-28: qty 1

## 2017-09-28 NOTE — Patient Instructions (Signed)
[facility] Discharge Instructions for Patients Receiving Chemotherapy  Today you received the following chemotherapy agents:  Herceptin, Taxotere, Carboplatin  To help prevent nausea and vomiting after your treatment, we encourage you to take your nausea medication as prescribed.   If you develop nausea and vomiting that is not controlled by your nausea medication, call the clinic.   BELOW ARE SYMPTOMS THAT SHOULD BE REPORTED IMMEDIATELY:  *FEVER GREATER THAN 100.5 F  *CHILLS WITH OR WITHOUT FEVER  NAUSEA AND VOMITING THAT IS NOT CONTROLLED WITH YOUR NAUSEA MEDICATION  *UNUSUAL SHORTNESS OF BREATH  *UNUSUAL BRUISING OR BLEEDING  TENDERNESS IN MOUTH AND THROAT WITH OR WITHOUT PRESENCE OF ULCERS  *URINARY PROBLEMS  *BOWEL PROBLEMS  UNUSUAL RASH Items with * indicate a potential emergency and should be followed up as soon as possible.  Feel free to call the clinic should you have any questions or concerns. The clinic phone number is [phone].  Please show the CHEMO ALERT CARD at check-in to the Emergency Department and triage nurse.   

## 2017-09-28 NOTE — Telephone Encounter (Signed)
Gave patient avs and calendar with updated appt. Per 11/6 los.

## 2017-09-28 NOTE — Assessment & Plan Note (Signed)
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 Resection of the positive margin 2. adjuvant chemotherapy with TCH followed by Herceptin maintenance for a year  3. Followed by adjuvant radiation 4. Followed by adjuvant antiestrogen therapy with tamoxifen that was started prior to surgery --------------------------------------------------------------------  Current treatment: Cycle 2 day 1 of TCH Chemo toxicities: 1. Fatigue 2. Hair loss  Return to clinic in 3 weeks for cycle 3 [facility] Monitoring closely for toxicities

## 2017-09-28 NOTE — Progress Notes (Signed)
Patient Care Team: [name], [name], MD as PCP - General (Family Medicine)  DIAGNOSIS:  Encounter Diagnosis  Name Primary?  . Malignant neoplasm of upper-outer quadrant of right breast in female, estrogen receptor positive ([place])     SUMMARY OF ONCOLOGIC HISTORY:   Malignant neoplasm of upper-outer quadrant of right breast in female, estrogen receptor positive ([place])   07/15/2017 Initial Diagnosis    Patient palpated a week mass in the right upper outer quadrant breast near the axilla in March 2018 mammogram revealed irregular spiculated mass 1.4 cm, by ultrasound measured 1.1 cm with a suspicious right axillary lymph node; biopsy revealed IDC grade 3, lymph node benign, ER 90%, PR 0%, Ki-67 40%, HER-2 positive ratio 2.6, T1c N0 stage IA, AJCC 8 clinical stage      08/06/2017 Genetic Testing    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  The Hereditary Gene Panel offered by [facility] includes sequencing and/or deletion duplication testing of the following 46 genes: APC, ATM, AXIN2, BARD1, BMPR1A, BRCA1, BRCA2, BRIP1, CDH1, CDKN2A (p14ARF), CDKN2A (p16INK4a), CHEK2, CTNNA1, DICER1, EPCAM (Deletion/duplication testing only), GREM1 (promoter region deletion/duplication testing only), KIT, MEN1, MLH1, MSH2, MSH3, MSH6, MUTYH, NBN, NF1, NHTL1, PALB2, PDGFRA, PMS2, POLD1, POLE, PTEN, RAD50, RAD51C, RAD51D, SDHB, SDHC, SDHD, SMAD4, SMARCA4. STK11, TP53, TSC1, TSC2, and VHL.  The following genes were evaluated for sequence changes only: SDHA and HOXB13 c.251G>A variant only.  The report date is August 06, 2017.      08/11/2017 Surgery    Right lumpectomy: IDC with DCIS, 1.4 cm, grade 3, DCIS focally involving posterior margin, 0/3 lymph nodes negative, ER 90%, PR 0%, HER-2 positive ratio 2.6, Ki-67 40% T1 CN 0 stage IA      09/07/2017 -  Adjuvant Chemotherapy    TCH x 6, then maintenance Herceptin x 1 year       CHIEF COMPLIANT:   INTERVAL HISTORY: Brianna  [name] is a 36 year old with above-mentioned history of right breast cancer treated with lumpectomy and is currently on adjuvant chemotherapy with TCH.  Today cycle 2 of treatment.  Overall she tolerated cycle 1 extremely well with fatigue is a major side effect.  She denied any nausea or vomiting.  Denies any fevers or chills.  REVIEW OF SYSTEMS:   Constitutional: Denies fevers, chills or abnormal weight loss Eyes: Denies blurriness of vision Ears, nose, mouth, throat, and face: Denies mucositis or sore throat Respiratory: Denies cough, dyspnea or wheezes Cardiovascular: Denies palpitation, chest discomfort Gastrointestinal:  Denies nausea, heartburn or change in bowel habits Skin: Denies abnormal skin rashes Lymphatics: Denies new lymphadenopathy or easy bruising Neurological:Denies numbness, tingling or new weaknesses Behavioral/Psych: Mood is stable, no new changes  Extremities: No lower extremity edema Breast:  denies any pain or lumps or nodules in either breasts All other systems were reviewed with the patient and are negative.  I have reviewed the past medical history, past surgical history, social history and family history with the patient and they are unchanged from previous note.  ALLERGIES:  has No Known Allergies.  MEDICATIONS:  Current Outpatient Medications  Medication Sig Dispense Refill  . Clindamycin-Benzoyl Per, Refr, gel Apply to face once daily in the morning.    Marland Kitchen dexamethasone (DECADRON) 4 MG tablet Take 1 tablet (4 mg total) by mouth daily. Take 1 tablet daily before chemotherapy and one tablet day after chemotherapy 12 tablet 0  . Famotidine (PEPCID AC PO) Take by mouth daily.    Marland Kitchen HYDROcodone-acetaminophen (  NORCO/VICODIN) 5-325 MG tablet Take 1 tablet by mouth every 6 (six) hours as needed for moderate pain. 20 tablet 0  . lidocaine-prilocaine (EMLA) cream Apply to affected area once 30 g 3  . LORazepam (ATIVAN) 0.5 MG tablet Take 1 tablet (0.5 mg total) by  mouth at bedtime. As needed for sleep 30 tablet 0  . naproxen sodium (ANAPROX) 220 MG tablet Take 220 mg by mouth as needed.    . ondansetron (ZOFRAN) 8 MG tablet Take 1 tablet (8 mg total) by mouth 2 (two) times daily as needed for refractory nausea / vomiting. Start on day 3 after chemo. 30 tablet 1  . prochlorperazine (COMPAZINE) 10 MG tablet Take 1 tablet (10 mg total) by mouth every 6 (six) hours as needed (Nausea or vomiting). 30 tablet 1  . spironolactone (ALDACTONE) 100 MG tablet Take 100 mg by mouth daily.    . Sulfacetamide Sodium-Sulfur (CLENIA FOAMING [place]) 10-5 % EMUL Apply topically.    . tretinoin (RETIN-A) 0.025 % gel Apply to face nightly     No current facility-administered medications for this visit.    Facility-Administered Medications Ordered in Other Visits  Medication Dose Route Frequency Provider Last Rate Last Dose  . sodium chloride flush (NS) 0.9 % injection 10 mL  10 mL Intravenous PRN Nicholas Lose, MD   10 mL at 09/28/17 1144    PHYSICAL EXAMINATION: ECOG PERFORMANCE STATUS: 1 - Symptomatic but completely ambulatory  There were no vitals filed for this visit. There were no vitals filed for this visit.  GENERAL:alert, no distress and comfortable SKIN: skin color, texture, turgor are normal, no rashes or significant lesions EYES: normal, Conjunctiva are pink and non-injected, sclera clear OROPHARYNX:no exudate, no erythema and lips, buccal mucosa, and tongue normal  NECK: supple, thyroid normal size, non-tender, without nodularity LYMPH:  no palpable lymphadenopathy in the cervical, axillary or inguinal LUNGS: clear to auscultation and percussion with normal breathing effort HEART: regular rate & rhythm and no murmurs and no lower extremity edema ABDOMEN:abdomen soft, non-tender and normal bowel sounds MUSCULOSKELETAL:no cyanosis of digits and no clubbing  NEURO: alert & oriented x 3 with fluent speech, no focal motor/sensory deficits EXTREMITIES: No lower  extremity edema BREAST: No palpable masses or nodules in either right or left breasts. No palpable axillary supraclavicular or infraclavicular adenopathy no breast tenderness or nipple discharge. (exam performed in the presence of a chaperone)  LABORATORY DATA:  I have reviewed the data as listed   Chemistry      Component Value Date/Time   NA 136 09/14/2017 1501   K 3.9 09/14/2017 1501   CL 103 08/06/2017 1517   CO2 26 09/14/2017 1501   BUN 9.0 09/14/2017 1501   CREATININE 0.9 09/14/2017 1501      Component Value Date/Time   CALCIUM 9.2 09/14/2017 1501   ALKPHOS 79 09/14/2017 1501   AST 20 09/14/2017 1501   ALT 21 09/14/2017 1501   BILITOT 0.22 09/14/2017 1501       Lab Results  Component Value Date   WBC 13.1 (H) 09/14/2017   HGB 13.0 09/14/2017   HCT 40.1 09/14/2017   MCV 92.9 09/14/2017   PLT 203 09/14/2017   NEUTROABS 7.3 (H) 09/14/2017    ASSESSMENT & PLAN:  Malignant neoplasm of upper-outer quadrant of right breast in female, estrogen receptor positive ([place]) Right lumpectomy: IDC with DCIS, 1.4 cm, grade 3, DCIS focally involving posterior margin, 0/3 lymph nodes negative, ER 90%, PR 0%, HER-2 positive ratio 2.6,  Ki-67 40% T1 CN 0 stage IA Tumor board did not recommend surgery for the positive posterior margin issue  Recommendation: 1. Resection of the positive margin 2. adjuvant chemotherapy with TCH followed by Herceptin maintenance for a year  3. Followed by adjuvant radiation 4. Followed by adjuvant antiestrogen therapy with tamoxifen that was started prior to surgery --------------------------------------------------------------------  Current treatment: Cycle 2 day 1 of TCH Chemo toxicities: 1. Fatigue 2. Hair loss  Return to clinic in 3 weeks for cycle 3 [facility] Monitoring closely for toxicities     I spent 25 minutes talking to the patient of which more than half was spent in counseling and coordination of care.  No orders of the defined types  were placed in this encounter.  The patient has a good understanding of the overall plan. she agrees with it. she will call with any problems that may develop before the next visit here.   Rulon Eisenmenger, MD 09/28/17

## 2017-09-29 ENCOUNTER — Ambulatory Visit (HOSPITAL_COMMUNITY)
Admission: RE | Admit: 2017-09-29 | Discharge: 2017-09-29 | Disposition: A | Discharge status: Home / Self Care | Payer: 59 | Source: Ambulatory Visit | Attending: Surgery | Admitting: Surgery

## 2017-09-29 DIAGNOSIS — I82611 Acute embolism and thrombosis of superficial veins of right upper extremity: Secondary | ICD-10-CM

## 2017-09-29 DIAGNOSIS — M79601 Pain in right arm: Secondary | ICD-10-CM | POA: Insufficient documentation

## 2017-09-29 NOTE — Progress Notes (Signed)
Right upper extremity venous duplex has been completed. Negative for DVT. Results were given to Abigail Butts at Dr. Vonna Kotyk office.  09/29/17 2:34 PM Carlos Levering RVT

## 2017-10-08 ENCOUNTER — Ambulatory Visit: Payer: 59 | Admitting: Physical Therapy

## 2017-10-12 ENCOUNTER — Ambulatory Visit: Payer: 59 | Attending: Surgery | Admitting: Physical Therapy

## 2017-10-12 DIAGNOSIS — M25611 Stiffness of right shoulder, not elsewhere classified: Secondary | ICD-10-CM

## 2017-10-12 DIAGNOSIS — M25511 Pain in right shoulder: Secondary | ICD-10-CM | POA: Diagnosis not present

## 2017-10-12 NOTE — Therapy (Addendum)
[place], [place], [address] Phone: [phone]   Fax:  [phone]  Physical Therapy Evaluation  Patient Details  Name: Brianna [name] MRN: [id number] Date of Birth: May 01, 1981 Referring Provider: Dr. Donnie Mesa   Encounter Date: 10/12/2017  PT End of Session - 10/12/17 1737    Visit Number  1    Number of Visits  8    Date for PT Re-Evaluation  11/22/17    PT Start Time  0850    PT Stop Time  0945    PT Time Calculation (min)  55 min    Activity Tolerance  Patient tolerated treatment well    Behavior During Therapy  [facility] for tasks assessed/performed       Past Medical History:  Diagnosis Date  . Acne   . History of concussion 08/2016  . IBS (irritable bowel syndrome)    no current med.  . Invasive ductal carcinoma of breast, right ([place]) 07/2017  . Pre-diabetes     Past Surgical History:  Procedure Laterality Date  . BREAST LUMPECTOMY WITH RADIOACTIVE SEED AND SENTINEL LYMPH NODE BIOPSY Right 08/11/2017   Procedure: RIGHT BREAST LUMPECTOMY WITH RADIOACTIVE SEED AND RIGHT SENTINEL LYMPH NODE BIOPSY;  Surgeon: Donnie Mesa, MD;  Location: [place] [facility];  Service: General;  Laterality: Right;  ERAS PATHWAY  . PORTACATH PLACEMENT Left 08/11/2017   Procedure: INSERTION PORT-A-CATH WITH ULTRA SOUND;  Surgeon: Donnie Mesa, MD;  Location: [facility];  Service: General;  Laterality: Left;  . WISDOM TOOTH EXTRACTION      There were no vitals filed for this visit.   Subjective Assessment - 10/12/17 0859    Subjective  "I do have some, I would call it weakness, in my right arm and shoulder.  It has been getting better, but there's still some weakness there, when reaching up, out or back."    Pertinent History  Right breast cancer diagnosed 07/16/17 and she underwent right lumpectomy and axillary sentinel lymphnode biopsy as well as left subclavian port placement on  08/11/17.  Diagnosis was 1.4 cm. 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 Started chemo on 09/07/17, every three weeks for six rounds.  Will have radiation and then Herceptin for a year. No other health issues. Just finished an antibiotic for a probable infection in her incision,and she feels better since then. [place] a year ago from wearing heels, and had $10,000 dental implant work for front teeth, so is fearful of falling.  Was diagnosed with IBS years ago, but has been okay until chemo has upset her digestive system.    Patient Stated Goals  to determine whether therapy can help with the weakness or whether it's something that will resolve itself    Currently in Pain?  Yes    Pain Score  0-No pain up to 6 with reaching    Pain Location  Shoulder    Pain Orientation  Right    Pain Descriptors / Indicators  Discomfort    Aggravating Factors   reaching, opening things or lifting    Pain Relieving Factors  at rest         [facility] PT Assessment - 10/12/17 0001      Assessment   Medical Diagnosis  right breast cancer s/p lumpectomy    Referring Provider  Dr. Donnie Mesa    Onset Date/Surgical Date  08/11/17    Hand Dominance  Right    Prior Therapy  none  Precautions   Precautions  Other (comment)    Precaution Comments  cancer precautions      Restrictions   Weight Bearing Restrictions  No      Balance Screen   Has the patient fallen in the past 6 months  No    Has the patient had a decrease in activity level because of a fear of falling?   No but has some fear with wearing heels due to a bad fall 2017    Is the patient reluctant to leave their home because of a fear of falling?   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 1.5-2 inches long and is well-healed; pt. has some darkened skin from the tape adhesive in the area      Posture/Postural Control   Posture/Postural Control  Postural limitations    Postural Limitations  Forward head      ROM / Strength   AROM / PROM / Strength  AROM;Strength      AROM   AROM Assessment Site  Shoulder    Right/Left Shoulder  Right;Left    Right Shoulder Flexion  149 Degrees measurement in sitting today    Right Shoulder ABduction  125 Degrees cording visible at axilla and elbow    Right Shoulder Internal Rotation  60 Degrees in supine    Right Shoulder External Rotation  75 Degrees    Left Shoulder Flexion  154 Degrees    Left Shoulder ABduction  160 Degrees    Left Shoulder Internal Rotation  80 Degrees    Left Shoulder External Rotation  80 Degrees      Strength   Overall Strength Comments  both shoulders grossly 5/5, but right shoulder resisted er is painful      Palpation   Palpation comment  cording also palpated at right axilla, right upper arm and antecubital fossa      Ambulation/Gait   Ambulation/Gait  Yes    Ambulation/Gait Assistance  7: Independent        LYMPHEDEMA/ONCOLOGY QUESTIONNAIRE - 10/12/17 0929      Surgeries   Lumpectomy Date  08/10/17      Treatment   Active Chemotherapy Treatment  Yes    Date  09/07/17    Past Chemotherapy Treatment  No    Active Radiation Treatment  No    Past Radiation Treatment  No      Lymphedema Assessments   Lymphedema Assessments  Upper extremities       Right Upper Extremity Lymphedema   10 cm Proximal to Olecranon Process  30.2 cm    Olecranon Process  26.6 cm    10 cm Proximal to Ulnar Styloid Process  24.7 cm    Just Proximal to Ulnar Styloid Process  18 cm    Across Hand at [employer]  20.6 cm    At [place] of 2nd Digit  6.7 cm      Left Upper Extremity Lymphedema   10 cm Proximal to Olecranon Process  30.1 cm    Olecranon Process  26 cm    10 cm Proximal to Ulnar Styloid Process  23.7 cm    Just Proximal to Ulnar Styloid Process  17.7 cm    Across Hand at [employer]  20 cm    At [place] of 2nd Digit  6.5 cm          Objective measurements completed on examination: See above findings.              PT Education - 10/12/17 1735    Education provided  Yes    Education Details  about axillary web syndrome, "Why Exercise?" flyer and explanation about research showing reduced breast cancer recurrence risk; post-op breast ROM exercises; ABC class info and flyer; [facility] program info, Liv[employer]rong at the [employer], and info about free yoga for cancer patients    Person(s) Educated  Patient    Methods  Explanation;Handout    Comprehension  Verbalized understanding             [place] - 10/12/17 1748      CC Long Term Goal  #1   Title  Pt. will be independent in HEP for right shoulder ROM and cording release.    Time  4    Period  Weeks    Status  New      CC Long Term Goal  #2   Title  Pt. will report at least 50% decrease in discomfort when reaching with right arm.    Time  4    Period  Weeks    Status  New      CC Long Term Goal  #3   Title  Pt. will report at least 50% perceived decrease in right cording.    Time  4    Period  Weeks    Status  New      CC Long Term Goal  #4   Title  Right shoulder active abduction to at least 140 degrees for improved ADLs.    Baseline  125 degrees at eval    Time  4    Period  Weeks    Status  New          Plan - 10/12/17 1737     Clinical Impression Statement  This is a patient who started the session very upset about a conversation she overheard among front office staff, but was calm and attentive a few minutes into the evaluation.  She had right breast lumpectomy and SLNB and is currently doing neo-adjuvant chemotherapy. She has limited right shoulder ROM and cording; left shoulder ROM is slightly limited as well.  She does not exercise and was told to stretch but had not been given stretches until today.  She feels that she has weakness in the right arm, but strength testing is grossly normal in that shoulder, other than that she reported pain with resisted external rotation.  Most likely what she reports as weakness is from tightness and cording in that right UE.  She has discomfort up to 6/10 with reaching with the right arm.    History and Personal Factors  relevant to plan of care:  she is right-handed and her complaints involve the right side; she works as a Education officer, museum and needs to carry equipment    Clinical Presentation  Evolving    Clinical Presentation due to:  just starting adjuvant chemo and more treatment to follow    Clinical Decision Making  Moderate    Rehab Potential  Good    Clinical Impairments Affecting Rehab Potential  currently in chemo    PT Frequency  2x / week    PT Duration  4 weeks prn    PT Treatment/Interventions  ADLs/Self Care Home Management;Electrical Stimulation;DME Instruction;Therapeutic exercise;Patient/family education;Manual techniques;Manual lymph drainage;Passive range of motion;Taping;Scar mobilization    PT Next Visit Plan  Begin P/AA/A/ROM of right shoulder; soft tissue mobilization of cording; myofascial release; HEP instruction for related things. Include UE strengthening.    Recommended Other Services  ABC class, yoga programs, consider FYNN and Livestrong later    Consulted and Agree with Plan of Care  Patient       Patient will benefit from skilled therapeutic intervention  in order to improve the following deficits and impairments:  Decreased range of motion, Other (comment), Pain, Decreased knowledge of precautions, Decreased knowledge of use of DME, Impaired UE functional use(cording)  Visit Diagnosis: Stiffness of right shoulder, not elsewhere classified - Plan: PT plan of care cert/re-cert  Acute pain of right shoulder - Plan: PT plan of care cert/re-cert     Problem List Patient Active Problem List   Diagnosis Date Noted  . Port-A-Cath in place 09/28/2017  . Superficial venous thrombosis of right upper extremity 09/27/2017  . Genetic testing 08/09/2017  . Malignant neoplasm of upper-outer quadrant of right breast in female, estrogen receptor positive ([place]) 07/22/2017  . IBS (irritable bowel syndrome) 05/25/2017  . Prediabetes 05/25/2017  . Acne vulgaris 12/18/2015    SALISBURY,DONNA 10/12/2017, 5:51 PM  [place], [place], [address] Phone: [phone]   Fax:  [phone]  Name: Brianna [name] MRN: [id number] Date of Birth: [birth year]  Serafina Royals, PT 10/12/17 5:51 PM  PHYSICAL THERAPY DISCHARGE SUMMARY  Visits from Start of Care: 1  Current functional level related to goals / functional outcomes: Unknown.  The patient called to cancel appointments she had scheduled, with no reason given.   Remaining deficits: Unknown   Education / Equipment: See education section above.  Plan: Patient agrees to discharge.  Patient goals were not met. Patient is being discharged due to not returning since the last visit.  ?????    Serafina Royals, PT 11/12/17 10:41 AM

## 2017-10-12 NOTE — Patient Instructions (Signed)
Axillary web syndrome (also called cording) can happen after having breast cancer surgery when lymph nodes in the armpit are removed. It presents as if you have a thin cord in your arm and can run from the armpit all the way down into the forearm. If you've had a sentinel node biopsy, the risk is 1-20% and if you've had an axillary lymph node dissection (more than 7 nodes removed), the risk is 36-72%. The ranges vary depending on the research study.  It most often happens 3-4 weeks post-op but can happen sooner or later. There are several possibilities for what cording actually is. Although no one knows for sure as of yet, it may be related to lymphatics, veins, or other tissue. Sometimes cording resolves on its own but other times it requires physical therapy with a therapist who specializes in lymphedema and/or cancer rehab. Treatment typically involves stretching, manual techniques, and exercise. Sometimes cords get "released" while stretching or during manual treatment and the patient may experience the sensation of a "pop." This may feel strange but it is not dangerous and is a sign that the cord has released; range of motion may be improved in the process.   [facility] [address] [place] [address]         [phone]  Why exercise?  So many benefits! Here are SOME of them: 1. Heart health, including raising your good cholesterol level and reducing heart rate and blood pressure 2. Lung health, including improved lung capacity 3. It burns fats, and most of [place] can stand to be leaner, whether or not we are overweight. 4. It increases the body's natural painkillers and mood elevators, so makes you feel better. 5. Not only makes you feel better, but look better too 6. Improves sleep 7. Takes a bite out of stress 8. May decrease your risk of many types of cancer 9. If you are currently undergoing cancer treatment, exercise may improve your ability to tolerate  treatments including chemotherapy. 10. For everybody, it can improve your energy level. Those with cancer-related fatigue report a 40-50% reduction in this symptom when exercising regularly. 11. If you are a survivor of breast, colon, or prostate cancer, it may decrease your risk of a recurrence. (This may hold for other cancers too, but so far we have data just for these three types.)  How to exercise: 1. Get your doctor's okay. 2. Pick something you enjoy doing, like walking, Zumba, biking, swimming, or whatever. 3. Start at low intensity and time, then gradually increase.  (See walking program handout.) 4. Set a goal to achieve over time.  The American Cancer Society, American Heart Association, and U.S. Dept. of Health and Human Services recommend 150 minutes of moderate exercise, 75 minutes of vigorous exercise, or a combination of both per week. This should be done in episodes at least 10 minutes long, spread throughout the week.  Need help being motivated? 1. Pick something you enjoy doing, because you'll be more inclined to stick with that activity than something that feels like a chore. 2. Do it with a friend so that you are accountable to each other. 3. Schedule it into your day. Place it on your calendar and keep that appointment just like you do any appointment that you make. 4. Join an exercise group that meets at a specific time.  That way, you have to show up on time, and that makes it harder to procrastinate about doing your workout.  It  also keeps you accountable-people begin to expect you to be there. 5. Join a gym where you feel comfortable and not intimidated, at the right cost. 6. Sign up for something that you'll need to be in shape for on a specific date, like a 1K or a 5K to walk or run, a 20 or 30 mile bike ride, a mud run or something like that. If the date is looming, you know you'll need to train to be ready for it.  An added benefit is that many of these are fundraisers  for good causes. 7. If you've already paid for a gym membership, group exercise class or event, you might as well work out, so you haven't wasted your money!

## 2017-10-19 ENCOUNTER — Other Ambulatory Visit (HOSPITAL_BASED_OUTPATIENT_CLINIC_OR_DEPARTMENT_OTHER): Payer: 59

## 2017-10-19 ENCOUNTER — Ambulatory Visit (HOSPITAL_BASED_OUTPATIENT_CLINIC_OR_DEPARTMENT_OTHER): Payer: 59

## 2017-10-19 ENCOUNTER — Ambulatory Visit: Payer: 59

## 2017-10-19 ENCOUNTER — Ambulatory Visit (HOSPITAL_BASED_OUTPATIENT_CLINIC_OR_DEPARTMENT_OTHER): Payer: 59 | Admitting: Adult Health

## 2017-10-19 DIAGNOSIS — Z170 Estrogen receptor positive status [ER+]: Principal | ICD-10-CM

## 2017-10-19 DIAGNOSIS — Z5111 Encounter for antineoplastic chemotherapy: Secondary | ICD-10-CM

## 2017-10-19 DIAGNOSIS — C50411 Malignant neoplasm of upper-outer quadrant of right female breast: Secondary | ICD-10-CM

## 2017-10-19 DIAGNOSIS — L658 Other specified nonscarring hair loss: Secondary | ICD-10-CM

## 2017-10-19 DIAGNOSIS — Z5112 Encounter for antineoplastic immunotherapy: Secondary | ICD-10-CM | POA: Diagnosis not present

## 2017-10-19 DIAGNOSIS — G620 Drug-induced polyneuropathy: Secondary | ICD-10-CM | POA: Diagnosis not present

## 2017-10-19 DIAGNOSIS — Z95828 Presence of other vascular implants and grafts: Secondary | ICD-10-CM

## 2017-10-19 LAB — COMPREHENSIVE METABOLIC PANEL
ALT: 67 U/L — ABNORMAL HIGH (ref 0–55)
ANION GAP: 10 meq/L (ref 3–11)
AST: 24 U/L (ref 5–34)
Albumin: 3.6 g/dL (ref 3.5–5.0)
Alkaline Phosphatase: 63 U/L (ref 40–150)
BUN: 13 mg/dL (ref 7.0–26.0)
CALCIUM: 9.2 mg/dL (ref 8.4–10.4)
CHLORIDE: 105 meq/L (ref 98–109)
CO2: 24 meq/L (ref 22–29)
Creatinine: 1 mg/dL (ref 0.6–1.1)
EGFR: >60 mL/min/{1.73_m2} (ref >60)
Glucose: 112 mg/dl (ref 70–140)
POTASSIUM: 3.4 meq/L — AB (ref 3.5–5.1)
Sodium: 139 mEq/L (ref 136–145)
Total Bilirubin: 0.29 mg/dL (ref 0.20–1.20)
Total Protein: 7 g/dL (ref 6.4–8.3)

## 2017-10-19 LAB — CBC WITH DIFFERENTIAL/PLATELET
BASO%: 0.3 % (ref 0.0–2.0)
Basophils Absolute: 0 10*3/uL (ref 0.0–0.1)
EOS ABS: 0.1 10*3/uL (ref 0.0–0.5)
EOS%: 0.9 % (ref 0.0–7.0)
HEMATOCRIT: 34.1 % — AB (ref 34.8–46.6)
HEMOGLOBIN: 11.2 g/dL — AB (ref 11.6–15.9)
LYMPH#: 3.3 10*3/uL (ref 0.9–3.3)
LYMPH%: 48.4 % (ref 14.0–49.7)
MCH: 30.7 pg (ref 25.1–34.0)
MCHC: 32.8 g/dL (ref 31.5–36.0)
MCV: 93.4 fL (ref 79.5–101.0)
MONO#: 0.5 10*3/uL (ref 0.1–0.9)
MONO%: 6.7 % (ref 0.0–14.0)
NEUT%: 43.7 % (ref 38.4–76.8)
NEUTROS ABS: 2.9 10*3/uL (ref 1.5–6.5)
Platelets: 236 10*3/uL (ref 145–400)
RBC: 3.65 10*6/uL — ABNORMAL LOW (ref 3.70–5.45)
RDW: 16.3 % — AB (ref 11.2–14.5)
WBC: 6.7 10*3/uL (ref 3.9–10.3)

## 2017-10-19 MED ORDER — ACETAMINOPHEN 325 MG PO TABS
650.0000 mg | ORAL_TABLET | Freq: Once | ORAL | Status: AC
  Administered 2017-10-19: 650 mg via ORAL

## 2017-10-19 MED ORDER — DIPHENHYDRAMINE HCL 25 MG PO CAPS
50.0000 mg | ORAL_CAPSULE | Freq: Once | ORAL | Status: AC
  Administered 2017-10-19: 50 mg via ORAL

## 2017-10-19 MED ORDER — DEXAMETHASONE SODIUM PHOSPHATE 10 MG/ML IJ SOLN
10.0000 mg | Freq: Once | INTRAMUSCULAR | Status: AC
  Administered 2017-10-19: 10 mg via INTRAVENOUS

## 2017-10-19 MED ORDER — HEPARIN SOD (PORK) LOCK FLUSH 100 UNIT/ML IV SOLN
500.0000 [IU] | Freq: Once | INTRAVENOUS | Status: AC | PRN
  Administered 2017-10-19: 500 [IU]
  Filled 2017-10-19: qty 5

## 2017-10-19 MED ORDER — SODIUM CHLORIDE 0.9 % IV SOLN
Freq: Once | INTRAVENOUS | Status: AC
  Administered 2017-10-19: 14:00:00 via INTRAVENOUS

## 2017-10-19 MED ORDER — PEGFILGRASTIM 6 MG/0.6ML ~~LOC~~ PSKT
6.0000 mg | PREFILLED_SYRINGE | Freq: Once | SUBCUTANEOUS | Status: AC
  Administered 2017-10-19: 6 mg via SUBCUTANEOUS
  Filled 2017-10-19: qty 0.6

## 2017-10-19 MED ORDER — SODIUM CHLORIDE 0.9 % IV SOLN
700.0000 mg | Freq: Once | INTRAVENOUS | Status: AC
  Administered 2017-10-19: 700 mg via INTRAVENOUS
  Filled 2017-10-19: qty 70

## 2017-10-19 MED ORDER — PALONOSETRON HCL INJECTION 0.25 MG/5ML
INTRAVENOUS | Status: AC
  Filled 2017-10-19: qty 5

## 2017-10-19 MED ORDER — SODIUM CHLORIDE 0.9% FLUSH
10.0000 mL | INTRAVENOUS | Status: DC | PRN
  Administered 2017-10-19: 10 mL via INTRAVENOUS
  Filled 2017-10-19: qty 10

## 2017-10-19 MED ORDER — SODIUM CHLORIDE 0.9 % IV SOLN
75.0000 mg/m2 | Freq: Once | INTRAVENOUS | Status: AC
  Administered 2017-10-19: 140 mg via INTRAVENOUS
  Filled 2017-10-19: qty 14

## 2017-10-19 MED ORDER — DIPHENHYDRAMINE HCL 25 MG PO CAPS
ORAL_CAPSULE | ORAL | Status: AC
  Filled 2017-10-19: qty 2

## 2017-10-19 MED ORDER — DEXAMETHASONE SODIUM PHOSPHATE 10 MG/ML IJ SOLN
INTRAMUSCULAR | Status: AC
  Filled 2017-10-19: qty 1

## 2017-10-19 MED ORDER — ACETAMINOPHEN 325 MG PO TABS
ORAL_TABLET | ORAL | Status: AC
  Filled 2017-10-19: qty 2

## 2017-10-19 MED ORDER — SODIUM CHLORIDE 0.9% FLUSH
10.0000 mL | INTRAVENOUS | Status: DC | PRN
  Administered 2017-10-19: 10 mL
  Filled 2017-10-19: qty 10

## 2017-10-19 MED ORDER — TRASTUZUMAB CHEMO 150 MG IV SOLR
450.0000 mg | Freq: Once | INTRAVENOUS | Status: AC
  Administered 2017-10-19: 450 mg via INTRAVENOUS
  Filled 2017-10-19: qty 21.43

## 2017-10-19 MED ORDER — PALONOSETRON HCL INJECTION 0.25 MG/5ML
0.2500 mg | Freq: Once | INTRAVENOUS | Status: AC
  Administered 2017-10-19: 0.25 mg via INTRAVENOUS

## 2017-10-19 NOTE — Progress Notes (Signed)
[facility] Follow up:    [name], [name], [place] [address]   DIAGNOSIS: Cancer Staging Malignant neoplasm of upper-outer quadrant of right breast in female, estrogen receptor positive ([place]) Staging form: Breast, AJCC 8th Edition - Pathologic: Stage IA (pT1c, pN0, cM0, G3, ER: Positive, PR: Negative, HER2: Positive) - Signed by Gardenia Phlegm, NP on 08/25/2017   SUMMARY OF ONCOLOGIC HISTORY:   Malignant neoplasm of upper-outer quadrant of right breast in female, estrogen receptor positive ([place])   07/15/2017 Initial Diagnosis    Patient palpated a week mass in the right upper outer quadrant breast near the axilla in March 2018 mammogram revealed irregular spiculated mass 1.4 cm, by ultrasound measured 1.1 cm with a suspicious right axillary lymph node; biopsy revealed IDC grade 3, lymph node benign, ER 90%, PR 0%, Ki-67 40%, HER-2 positive ratio 2.6, T1c N0 stage IA, AJCC 8 clinical stage      08/06/2017 Genetic Testing    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  The Hereditary Gene Panel offered by [facility] includes sequencing and/or deletion duplication testing of the following 46 genes: APC, ATM, AXIN2, BARD1, BMPR1A, BRCA1, BRCA2, BRIP1, CDH1, CDKN2A (p14ARF), CDKN2A (p16INK4a), CHEK2, CTNNA1, DICER1, EPCAM (Deletion/duplication testing only), GREM1 (promoter region deletion/duplication testing only), KIT, MEN1, MLH1, MSH2, MSH3, MSH6, MUTYH, NBN, NF1, NHTL1, PALB2, PDGFRA, PMS2, POLD1, POLE, PTEN, RAD50, RAD51C, RAD51D, SDHB, SDHC, SDHD, SMAD4, SMARCA4. STK11, TP53, TSC1, TSC2, and VHL.  The following genes were evaluated for sequence changes only: SDHA and HOXB13 c.251G>A variant only.  The report date is August 06, 2017.      08/11/2017 Surgery    Right lumpectomy: IDC with DCIS, 1.4 cm, grade 3, DCIS focally involving posterior margin, 0/3 lymph nodes negative, ER 90%, PR 0%, HER-2  positive ratio 2.6, Ki-67 40% T1 CN 0 stage IA      09/07/2017 -  Adjuvant Chemotherapy    TCH x 6, then maintenance Herceptin x 1 year       CURRENT THERAPY:TCH cycle 3 day 1  INTERVAL HISTORY: Brianna [name] 36 y.o. female returns for evaluation prior to receiving chemotherapy with [place].  She is doing moderately well.  She has no issues today.  She has mild intermittent numbness/tingling in her toes.  She denies any balance, motor changes.    Patient Active Problem List   Diagnosis Date Noted  . Port-A-Cath in place 09/28/2017  . Superficial venous thrombosis of right upper extremity 09/27/2017  . Genetic testing 08/09/2017  . Malignant neoplasm of upper-outer quadrant of right breast in female, estrogen receptor positive ([place]) 07/22/2017  . IBS (irritable bowel syndrome) 05/25/2017  . Prediabetes 05/25/2017  . Acne vulgaris 12/18/2015    has No Known Allergies.  MEDICAL HISTORY: Past Medical History:  Diagnosis Date  . Acne   . History of concussion 08/2016  . IBS (irritable bowel syndrome)    no current med.  . Invasive ductal carcinoma of breast, right ([facility]) 07/2017  . Pre-diabetes     SURGICAL HISTORY: Past Surgical History:  Procedure Laterality Date  . BREAST LUMPECTOMY WITH RADIOACTIVE SEED AND SENTINEL LYMPH NODE BIOPSY Right 08/11/2017   Procedure: RIGHT BREAST LUMPECTOMY WITH RADIOACTIVE SEED AND RIGHT SENTINEL LYMPH NODE BIOPSY;  Surgeon: Donnie Mesa, MD;  Location: [facility];  Service: General;  Laterality: Right;  ERAS PATHWAY  . PORTACATH PLACEMENT Left 08/11/2017   Procedure: INSERTION PORT-A-CATH WITH ULTRA SOUND;  Surgeon: Donnie Mesa, MD;  Location: [facility];  Service: General;  Laterality: Left;  . WISDOM TOOTH EXTRACTION      SOCIAL HISTORY: Social History   Socioeconomic History  . Marital status: Married    Spouse name: Not on file  . Number of children: Not on file  . Years of education: Not on file   . Highest education level: Not on file  Social Needs  . Financial resource strain: Not on file  . Food insecurity - worry: Not on file  . Food insecurity - inability: Not on file  . Transportation needs - medical: Not on file  . Transportation needs - non-medical: Not on file  Occupational History  . Not on file  Tobacco Use  . Smoking status: Never Smoker  . Smokeless tobacco: Never Used  Substance and Sexual Activity  . Alcohol use: Yes    Comment: occasionally  . Drug use: No  . Sexual activity: No  Other Topics Concern  . Not on file  Social History Narrative  . Not on file    FAMILY HISTORY: Family History  Problem Relation Age of Onset  . Heart disease Maternal Grandmother        d.53  . Stroke Maternal Grandfather   . Diabetes Maternal Grandfather   . Hyperlipidemia Mother   . Diabetes Maternal Uncle   . Heart attack Other   . Obesity Other   . COPD Other   . Asthma Other   . Breast cancer Other 83       Maternal grandfather's sister    Review of Systems  Constitutional: Negative for appetite change, chills, fatigue, fever and unexpected weight change.  HENT:   Negative for hearing loss, lump/mass and trouble swallowing.   Eyes: Negative for eye problems and icterus.  Respiratory: Negative for chest tightness, cough and shortness of breath.   Cardiovascular: Negative for chest pain, leg swelling and palpitations.  Gastrointestinal: Negative for abdominal distention, abdominal pain, constipation, diarrhea and nausea.  Endocrine: Negative for hot flashes.  Genitourinary: Negative for difficulty urinating.   Musculoskeletal: Negative for arthralgias.  Skin: Negative for itching.  Neurological: Negative for dizziness, extremity weakness and headaches.  Hematological: Negative for adenopathy. Does not bruise/bleed easily.  Psychiatric/Behavioral: Negative for depression. The patient is not nervous/anxious.       PHYSICAL EXAMINATION  ECOG PERFORMANCE  STATUS: 1 - Symptomatic but completely ambulatory  Vitals:   10/19/17 1236  BP: 96/78  Pulse: 92  Resp: 20  Temp: 98.5 F (36.9 C)  SpO2: 100%    Physical Exam  Constitutional: She is oriented to person, place, and time and well-developed, well-nourished, and in no distress.  HENT:  Head: Normocephalic and atraumatic.  Mouth/Throat: Oropharynx is clear and moist. No oropharyngeal exudate.  Eyes: Pupils are equal, round, and reactive to light. No scleral icterus.  Neck: Neck supple.  Cardiovascular: Normal rate, regular rhythm and normal heart sounds. Exam reveals no gallop and no friction rub.  No murmur heard. Pulmonary/Chest: Effort normal and breath sounds normal. No respiratory distress. She has no wheezes. She has no rales.  Abdominal: Soft. Bowel sounds are normal.  Musculoskeletal: She exhibits no edema.  Lymphadenopathy:    She has no cervical adenopathy.  Neurological: She is alert and oriented to person, place, and time.  Skin: Skin is warm and dry. No rash noted.  Psychiatric: Mood and affect normal.    LABORATORY DATA:  CBC    Component Value Date/Time  WBC 6.7 10/19/2017 1149   WBC 6.0 07/03/2013 1216   RBC 3.65 (L) 10/19/2017 1149   RBC 4.10 07/03/2013 1216   HGB 11.2 (L) 10/19/2017 1149   HCT 34.1 (L) 10/19/2017 1149   PLT 236 10/19/2017 1149   MCV 93.4 10/19/2017 1149   MCH 30.7 10/19/2017 1149   MCH 30.5 07/03/2013 1216   MCHC 32.8 10/19/2017 1149   MCHC 34.1 07/03/2013 1216   RDW 16.3 (H) 10/19/2017 1149   LYMPHSABS 3.3 10/19/2017 1149   MONOABS 0.5 10/19/2017 1149   EOSABS 0.1 10/19/2017 1149   BASOSABS 0.0 10/19/2017 1149    CMP     Component Value Date/Time   NA 139 10/19/2017 1149   K 3.4 (L) 10/19/2017 1149   CL 103 08/06/2017 1517   CO2 24 10/19/2017 1149   GLUCOSE 112 10/19/2017 1149   BUN 13.0 10/19/2017 1149   CREATININE 1.0 10/19/2017 1149   CALCIUM 9.2 10/19/2017 1149   PROT 7.0 10/19/2017 1149   ALBUMIN 3.6 10/19/2017  1149   AST 24 10/19/2017 1149   ALT 67 (H) 10/19/2017 1149   ALKPHOS 63 10/19/2017 1149   BILITOT 0.29 10/19/2017 1149   GFRNONAA >60 08/06/2017 1517   GFRAA >60 08/06/2017 1517   ASSESSMENT and PLAN:   Malignant neoplasm of upper-outer quadrant of right breast in female, estrogen receptor positive ([place]) Right lumpectomy: IDC with DCIS, 1.4 cm, grade 3, DCIS focally involving posterior margin, 0/3 lymph nodes negative, ER 90%, PR 0%, HER-2 positive ratio 2.6, Ki-67 40% T1 CN 0 stage IA Tumor board did not recommend surgery for the positive posterior margin issue  Recommendation: 1. Resection of the positive margin 2. adjuvant chemotherapy with TCH followed by Herceptin maintenance for a year  3. Followed by adjuvant radiation 4. Followed by adjuvant antiestrogen therapy with tamoxifen that was started prior to surgery --------------------------------------------------------------------  Current treatment: Cycle 3 day 1 of TCH Chemo toxicities: 1. Fatigue 2. Hair loss 3. 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  She will proceed with treatment and we will monitor, should it worsen we will need to consider dose reduction.    I went ahead and ordered her echo as this will be due in about a month.  She will return in 3 weeks for labs, f/u with Dr. Lindi Adie, and cycle 4 of [facility].          Orders Placed This Encounter  Procedures  . ECHOCARDIOGRAM COMPLETE    Standing Status:   Future    Standing Expiration Date:   01/19/2019    Order Specific Question:   Where should this test be performed    Answer:   [facility]    Order Specific Question:   Perflutren DEFINITY (image enhancing agent) should be administered unless hypersensitivity or allergy exist    Answer:   Administer Perflutren    Order Specific Question:   Expected Date:    Answer:   1 month    All questions were answered. The patient knows to call the clinic with any problems, questions or  concerns. We can certainly see the patient much sooner if necessary.  A total of (30) minutes of face-to-face time was spent with this patient with greater than 50% of that time in counseling and care-coordination.  This note was electronically signed. Scot Dock, NP 10/21/2017

## 2017-10-19 NOTE — Patient Instructions (Addendum)
[facility] Discharge Instructions for Patients Receiving Chemotherapy  Today you received the following chemotherapy agents trastuzumab (Herceptin), docetaxel (Taxotere), Carboplatin.  To help prevent nausea and vomiting after your treatment, we encourage you to take your nausea medication as prescribed.  If you develop nausea and vomiting that is not controlled by your nausea medication, call the clinic.   BELOW ARE SYMPTOMS THAT SHOULD BE REPORTED IMMEDIATELY:  *FEVER GREATER THAN 100.5 F  *CHILLS WITH OR WITHOUT FEVER  NAUSEA AND VOMITING THAT IS NOT CONTROLLED WITH YOUR NAUSEA MEDICATION  *UNUSUAL SHORTNESS OF BREATH  *UNUSUAL BRUISING OR BLEEDING  TENDERNESS IN MOUTH AND THROAT WITH OR WITHOUT PRESENCE OF ULCERS  *URINARY PROBLEMS  *BOWEL PROBLEMS  UNUSUAL RASH Items with * indicate a potential emergency and should be followed up as soon as possible.  Feel free to call the clinic should you have any questions or concerns. The clinic phone number is [phone].  Please show the [place] [facility] at check-in to the Emergency Department and triage nurse.

## 2017-10-19 NOTE — Assessment & Plan Note (Addendum)
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 Resection of the positive margin 2. adjuvant chemotherapy with TCH followed by Herceptin maintenance for a year  3. Followed by adjuvant radiation 4. Followed by adjuvant antiestrogen therapy with tamoxifen that was started prior to surgery --------------------------------------------------------------------  Current treatment: Cycle 3 day 1 of TCH Chemo toxicities: 1. Fatigue 2. Hair loss 3. 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  She will proceed with treatment and we will monitor, should it worsen we will need to consider dose reduction.    I went ahead and ordered her echo as this will be due in about a month.  She will return in 3 weeks for labs, f/u with Dr. Lindi Adie, and cycle 4 of [facility].

## 2017-10-20 ENCOUNTER — Telehealth: Payer: Self-pay | Admitting: Adult Health

## 2017-10-20 NOTE — Telephone Encounter (Signed)
Per 11/27 - ECHO not scheduled yet but will be scheduled by Cardiovascular dept . -per Butch Penny , patient is in the work que and will be contacted.

## 2017-10-21 ENCOUNTER — Encounter: Payer: Self-pay | Admitting: Adult Health

## 2017-10-21 ENCOUNTER — Ambulatory Visit: Payer: 59

## 2017-10-26 ENCOUNTER — Encounter: Payer: 59 | Admitting: Physical Therapy

## 2017-10-28 ENCOUNTER — Ambulatory Visit (HOSPITAL_COMMUNITY)
Admission: RE | Admit: 2017-10-28 | Discharge: 2017-10-28 | Disposition: A | Discharge status: Home / Self Care | Payer: 59 | Source: Ambulatory Visit | Attending: Adult Health | Admitting: Adult Health

## 2017-10-28 DIAGNOSIS — Z5111 Encounter for antineoplastic chemotherapy: Secondary | ICD-10-CM | POA: Diagnosis not present

## 2017-10-28 NOTE — Progress Notes (Signed)
  Echocardiogram 2D Echocardiogram has been performed.  Quentez Lober L Androw 10/28/2017, 11:56 AM

## 2017-11-01 ENCOUNTER — Encounter: Payer: 59 | Admitting: Physical Therapy

## 2017-11-08 NOTE — Assessment & Plan Note (Signed)
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 Resection of the positive margin 2. adjuvant chemotherapy with TCH followed by Herceptin maintenance for a year  3. Followed by adjuvant radiation 4. Followed by adjuvant antiestrogen therapy with tamoxifen that was started prior to surgery --------------------------------------------------------------------  Current treatment: Cycle 4 day 1 of TCH Chemo toxicities: 1. Fatigue 2. Hair loss 3. 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  She will proceed with treatment and we will monitor, should it worsen we will need to consider dose reduction.    I went ahead and ordered her echo as this will be due in about a month.  She will return in 3 weeks for labs, f/u with Dr. Lindi Adie, and cycle 5 of [facility].

## 2017-11-09 ENCOUNTER — Ambulatory Visit (HOSPITAL_BASED_OUTPATIENT_CLINIC_OR_DEPARTMENT_OTHER): Payer: 59 | Admitting: Hematology and Oncology

## 2017-11-09 ENCOUNTER — Ambulatory Visit: Payer: 59

## 2017-11-09 ENCOUNTER — Other Ambulatory Visit (HOSPITAL_BASED_OUTPATIENT_CLINIC_OR_DEPARTMENT_OTHER): Payer: 59

## 2017-11-09 ENCOUNTER — Ambulatory Visit (HOSPITAL_BASED_OUTPATIENT_CLINIC_OR_DEPARTMENT_OTHER): Payer: 59

## 2017-11-09 ENCOUNTER — Other Ambulatory Visit: Payer: Self-pay

## 2017-11-09 DIAGNOSIS — R530 Neoplastic (malignant) related fatigue: Secondary | ICD-10-CM | POA: Diagnosis not present

## 2017-11-09 DIAGNOSIS — Z170 Estrogen receptor positive status [ER+]: Secondary | ICD-10-CM | POA: Diagnosis not present

## 2017-11-09 DIAGNOSIS — Z5111 Encounter for antineoplastic chemotherapy: Secondary | ICD-10-CM

## 2017-11-09 DIAGNOSIS — G620 Drug-induced polyneuropathy: Secondary | ICD-10-CM

## 2017-11-09 DIAGNOSIS — C50411 Malignant neoplasm of upper-outer quadrant of right female breast: Secondary | ICD-10-CM

## 2017-11-09 DIAGNOSIS — Z5112 Encounter for antineoplastic immunotherapy: Secondary | ICD-10-CM | POA: Diagnosis not present

## 2017-11-09 DIAGNOSIS — L658 Other specified nonscarring hair loss: Secondary | ICD-10-CM | POA: Diagnosis not present

## 2017-11-09 DIAGNOSIS — Z95828 Presence of other vascular implants and grafts: Secondary | ICD-10-CM

## 2017-11-09 LAB — CBC WITH DIFFERENTIAL/PLATELET
BASO%: 0.4 % (ref 0.0–2.0)
Basophils Absolute: 0 10*3/uL (ref 0.0–0.1)
EOS ABS: 0.1 10*3/uL (ref 0.0–0.5)
EOS%: 0.9 % (ref 0.0–7.0)
HCT: 32.1 % — ABNORMAL LOW (ref 34.8–46.6)
HGB: 10.4 g/dL — ABNORMAL LOW (ref 11.6–15.9)
LYMPH%: 46.1 % (ref 14.0–49.7)
MCH: 31.4 pg (ref 25.1–34.0)
MCHC: 32.5 g/dL (ref 31.5–36.0)
MCV: 96.7 fL (ref 79.5–101.0)
MONO#: 0.6 10*3/uL (ref 0.1–0.9)
MONO%: 7.7 % (ref 0.0–14.0)
NEUT%: 44.9 % (ref 38.4–76.8)
NEUTROS ABS: 3.7 10*3/uL (ref 1.5–6.5)
PLATELETS: 153 10*3/uL (ref 145–400)
RBC: 3.32 10*6/uL — AB (ref 3.70–5.45)
RDW: 18.3 % — ABNORMAL HIGH (ref 11.2–14.5)
WBC: 8.3 10*3/uL (ref 3.9–10.3)
lymph#: 3.8 10*3/uL — ABNORMAL HIGH (ref 0.9–3.3)

## 2017-11-09 LAB — COMPREHENSIVE METABOLIC PANEL
ALBUMIN: 3.5 g/dL (ref 3.5–5.0)
ALK PHOS: 58 U/L (ref 40–150)
ALT: 19 U/L (ref 0–55)
AST: 15 U/L (ref 5–34)
Anion Gap: 10 mEq/L (ref 3–11)
BUN: 14 mg/dL (ref 7.0–26.0)
CALCIUM: 8.7 mg/dL (ref 8.4–10.4)
CHLORIDE: 106 meq/L (ref 98–109)
CO2: 23 mEq/L (ref 22–29)
CREATININE: 1 mg/dL (ref 0.6–1.1)
EGFR: >60 mL/min/{1.73_m2} (ref >60)
GLUCOSE: 127 mg/dL (ref 70–140)
Potassium: 3.1 mEq/L — ABNORMAL LOW (ref 3.5–5.1)
SODIUM: 138 meq/L (ref 136–145)
Total Bilirubin: <0.22 mg/dL (ref 0.20–1.20)
Total Protein: 6.5 g/dL (ref 6.4–8.3)

## 2017-11-09 MED ORDER — PALONOSETRON HCL INJECTION 0.25 MG/5ML
0.2500 mg | Freq: Once | INTRAVENOUS | Status: AC
  Administered 2017-11-09: 0.25 mg via INTRAVENOUS

## 2017-11-09 MED ORDER — DIPHENHYDRAMINE HCL 25 MG PO CAPS
50.0000 mg | ORAL_CAPSULE | Freq: Once | ORAL | Status: AC
  Administered 2017-11-09: 50 mg via ORAL

## 2017-11-09 MED ORDER — PEGFILGRASTIM 6 MG/0.6ML ~~LOC~~ PSKT
6.0000 mg | PREFILLED_SYRINGE | Freq: Once | SUBCUTANEOUS | Status: AC
  Administered 2017-11-09: 6 mg via SUBCUTANEOUS

## 2017-11-09 MED ORDER — DIPHENHYDRAMINE HCL 25 MG PO CAPS
ORAL_CAPSULE | ORAL | Status: AC
  Filled 2017-11-09: qty 2

## 2017-11-09 MED ORDER — PALONOSETRON HCL INJECTION 0.25 MG/5ML
INTRAVENOUS | Status: AC
  Filled 2017-11-09: qty 5

## 2017-11-09 MED ORDER — SODIUM CHLORIDE 0.9% FLUSH
10.0000 mL | INTRAVENOUS | Status: DC | PRN
  Administered 2017-11-09: 10 mL via INTRAVENOUS
  Filled 2017-11-09: qty 10

## 2017-11-09 MED ORDER — SODIUM CHLORIDE 0.9 % IV SOLN
Freq: Once | INTRAVENOUS | Status: AC
  Administered 2017-11-09: 15:00:00 via INTRAVENOUS

## 2017-11-09 MED ORDER — SODIUM CHLORIDE 0.9 % IV SOLN
75.0000 mg/m2 | Freq: Once | INTRAVENOUS | Status: AC
  Administered 2017-11-09: 140 mg via INTRAVENOUS
  Filled 2017-11-09: qty 14

## 2017-11-09 MED ORDER — TRASTUZUMAB CHEMO 150 MG IV SOLR
450.0000 mg | Freq: Once | INTRAVENOUS | Status: AC
  Administered 2017-11-09: 450 mg via INTRAVENOUS
  Filled 2017-11-09: qty 21.43

## 2017-11-09 MED ORDER — HEPARIN SOD (PORK) LOCK FLUSH 100 UNIT/ML IV SOLN
500.0000 [IU] | Freq: Once | INTRAVENOUS | Status: AC | PRN
  Administered 2017-11-09: 500 [IU]
  Filled 2017-11-09: qty 5

## 2017-11-09 MED ORDER — SODIUM CHLORIDE 0.9 % IV SOLN
700.0000 mg | Freq: Once | INTRAVENOUS | Status: AC
  Administered 2017-11-09: 700 mg via INTRAVENOUS
  Filled 2017-11-09: qty 70

## 2017-11-09 MED ORDER — ACETAMINOPHEN 325 MG PO TABS
650.0000 mg | ORAL_TABLET | Freq: Once | ORAL | Status: AC
  Administered 2017-11-09: 650 mg via ORAL

## 2017-11-09 MED ORDER — PEGFILGRASTIM 6 MG/0.6ML ~~LOC~~ PSKT
PREFILLED_SYRINGE | SUBCUTANEOUS | Status: AC
  Filled 2017-11-09: qty 0.6

## 2017-11-09 MED ORDER — SODIUM CHLORIDE 0.9% FLUSH
10.0000 mL | INTRAVENOUS | Status: DC | PRN
  Administered 2017-11-09: 10 mL
  Filled 2017-11-09: qty 10

## 2017-11-09 MED ORDER — ACETAMINOPHEN 325 MG PO TABS
ORAL_TABLET | ORAL | Status: AC
  Filled 2017-11-09: qty 2

## 2017-11-09 MED ORDER — DEXAMETHASONE SODIUM PHOSPHATE 10 MG/ML IJ SOLN
10.0000 mg | Freq: Once | INTRAMUSCULAR | Status: AC
  Administered 2017-11-09: 10 mg via INTRAVENOUS

## 2017-11-09 MED ORDER — DEXAMETHASONE SODIUM PHOSPHATE 10 MG/ML IJ SOLN
INTRAMUSCULAR | Status: AC
  Filled 2017-11-09: qty 1

## 2017-11-09 NOTE — Progress Notes (Signed)
Patient Care Team: [name], [name], MD as PCP - General (Family Medicine)  DIAGNOSIS:  Encounter Diagnosis  Name Primary?  . Malignant neoplasm of upper-outer quadrant of right breast in female, estrogen receptor positive ([place])     SUMMARY OF ONCOLOGIC HISTORY:   Malignant neoplasm of upper-outer quadrant of right breast in female, estrogen receptor positive ([place])   07/15/2017 Initial Diagnosis    Patient palpated a week mass in the right upper outer quadrant breast near the axilla in March 2018 mammogram revealed irregular spiculated mass 1.4 cm, by ultrasound measured 1.1 cm with a suspicious right axillary lymph node; biopsy revealed IDC grade 3, lymph node benign, ER 90%, PR 0%, Ki-67 40%, HER-2 positive ratio 2.6, T1c N0 stage IA, AJCC 8 clinical stage      08/06/2017 Genetic Testing    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  The Hereditary Gene Panel offered by [facility] includes sequencing and/or deletion duplication testing of the following 46 genes: APC, ATM, AXIN2, BARD1, BMPR1A, BRCA1, BRCA2, BRIP1, CDH1, CDKN2A (p14ARF), CDKN2A (p16INK4a), CHEK2, CTNNA1, DICER1, EPCAM (Deletion/duplication testing only), GREM1 (promoter region deletion/duplication testing only), KIT, MEN1, MLH1, MSH2, MSH3, MSH6, MUTYH, NBN, NF1, NHTL1, PALB2, PDGFRA, PMS2, POLD1, POLE, PTEN, RAD50, RAD51C, RAD51D, SDHB, SDHC, SDHD, SMAD4, SMARCA4. STK11, TP53, TSC1, TSC2, and VHL.  The following genes were evaluated for sequence changes only: SDHA and HOXB13 c.251G>A variant only.  The report date is August 06, 2017.      08/11/2017 Surgery    Right lumpectomy: IDC with DCIS, 1.4 cm, grade 3, DCIS focally involving posterior margin, 0/3 lymph nodes negative, ER 90%, PR 0%, HER-2 positive ratio 2.6, Ki-67 40% T1 CN 0 stage IA      09/07/2017 -  Adjuvant Chemotherapy    TCH x 6, then maintenance Herceptin x 1 year       CHIEF COMPLIANT: Cycle 4 TCH  INTERVAL  HISTORY: Brianna [name] is a 36 year old with above-mentioned history of right breast cancer treated with lumpectomy and is currently on adjuvant chemotherapy today cycle 4 of TCH.  She had developed peripheral neuropathy and we are monitoring her.  Neuropathies of the tips of the fingers and it comes and goes.  The week after each chemotherapy she has profound fatigue and nausea 5 days.  She denies any nausea vomiting.  She does complain of profound fatigue.  REVIEW OF SYSTEMS:   Constitutional: Denies fevers, chills or abnormal weight loss, complains of fatigue Eyes: Denies blurriness of vision Ears, nose, mouth, throat, and face: Denies mucositis or sore throat Respiratory: Denies cough, dyspnea or wheezes Cardiovascular: Denies palpitation, chest discomfort Gastrointestinal: Nausea after each chemotherapy Skin: Denies abnormal skin rashes Lymphatics: Denies new lymphadenopathy or easy bruising Neurological:Denies numbness, tingling or new weaknesses Behavioral/Psych: Mood is stable, no new changes  Extremities: No lower extremity edema Breast:  denies any pain or lumps or nodules in either breasts All other systems were reviewed with the patient and are negative.  I have reviewed the past medical history, past surgical history, social history and family history with the patient and they are unchanged from previous note.  ALLERGIES:  has No Known Allergies.  MEDICATIONS:  Current Outpatient Medications  Medication Sig Dispense Refill  . Clindamycin-Benzoyl Per, Refr, gel Apply to face once daily in the morning.    Marland Kitchen dexamethasone (DECADRON) 4 MG tablet Take 1 tablet (4 mg total) by mouth daily. Take 1 tablet daily before chemotherapy and one tablet day  after chemotherapy 12 tablet 0  . Famotidine (PEPCID AC PO) Take by mouth daily.    Marland Kitchen HYDROcodone-acetaminophen (NORCO/VICODIN) 5-325 MG tablet Take 1 tablet by mouth every 6 (six) hours as needed for moderate pain. (Patient not taking:  Reported on 10/12/2017) 20 tablet 0  . lidocaine-prilocaine (EMLA) cream Apply to affected area once 30 g 3  . LORazepam (ATIVAN) 0.5 MG tablet Take 1 tablet (0.5 mg total) by mouth at bedtime. As needed for sleep 30 tablet 0  . naproxen sodium (ANAPROX) 220 MG tablet Take 220 mg by mouth as needed.    . ondansetron (ZOFRAN) 8 MG tablet Take 1 tablet (8 mg total) by mouth 2 (two) times daily as needed for refractory nausea / vomiting. Start on day 3 after chemo. 30 tablet 1  . prochlorperazine (COMPAZINE) 10 MG tablet Take 1 tablet (10 mg total) by mouth every 6 (six) hours as needed (Nausea or vomiting). 30 tablet 1  . spironolactone (ALDACTONE) 100 MG tablet Take 100 mg by mouth daily.    . Sulfacetamide Sodium-Sulfur (CLENIA FOAMING [place]) 10-5 % EMUL Apply topically.    . tretinoin (RETIN-A) 0.025 % gel Apply to face nightly     No current facility-administered medications for this visit.     PHYSICAL EXAMINATION: ECOG PERFORMANCE STATUS: 1 - Symptomatic but completely ambulatory  Vitals:   11/09/17 1331  BP: 105/69  Pulse: 86  Resp: 18  Temp: 98.7 F (37.1 C)  SpO2: 100%   Filed Weights   11/09/17 1331  Weight: 174 lb 12.8 oz (79.3 kg)    GENERAL:alert, no distress and comfortable SKIN: skin color, texture, turgor are normal, no rashes or significant lesions EYES: normal, Conjunctiva are pink and non-injected, sclera clear OROPHARYNX:no exudate, no erythema and lips, buccal mucosa, and tongue normal  NECK: supple, thyroid normal size, non-tender, without nodularity LYMPH:  no palpable lymphadenopathy in the cervical, axillary or inguinal LUNGS: clear to auscultation and percussion with normal breathing effort HEART: regular rate & rhythm and no murmurs and no lower extremity edema ABDOMEN:abdomen soft, non-tender and normal bowel sounds MUSCULOSKELETAL:no cyanosis of digits and no clubbing  NEURO: alert & oriented x 3 with fluent speech, no focal motor/sensory  deficits EXTREMITIES: No lower extremity edema BREAST: No palpable masses or nodules in either right or left breasts. No palpable axillary supraclavicular or infraclavicular adenopathy no breast tenderness or nipple discharge. (exam performed in the presence of a chaperone)  LABORATORY DATA:  I have reviewed the data as listed   Chemistry      Component Value Date/Time   NA 139 10/19/2017 1149   K 3.4 (L) 10/19/2017 1149   CL 103 08/06/2017 1517   CO2 24 10/19/2017 1149   BUN 13.0 10/19/2017 1149   CREATININE 1.0 10/19/2017 1149      Component Value Date/Time   CALCIUM 9.2 10/19/2017 1149   ALKPHOS 63 10/19/2017 1149   AST 24 10/19/2017 1149   ALT 67 (H) 10/19/2017 1149   BILITOT 0.29 10/19/2017 1149       Lab Results  Component Value Date   WBC 8.3 11/09/2017   HGB 10.4 (L) 11/09/2017   HCT 32.1 (L) 11/09/2017   MCV 96.7 11/09/2017   PLT 153 11/09/2017   NEUTROABS 3.7 11/09/2017    ASSESSMENT & PLAN:  Malignant neoplasm of upper-outer quadrant of right breast in female, estrogen receptor positive ([place]) Right lumpectomy: IDC with DCIS, 1.4 cm, grade 3, DCIS focally involving posterior margin, 0/3 lymph nodes  negative, ER 90%, PR 0%, HER-2 positive ratio 2.6, Ki-67 40% T1 CN 0 stage IA Tumor board did not recommend surgery for the positive posterior margin issue  Recommendation: 1. Resection of the positive margin 2. adjuvant chemotherapy with TCH followed by Herceptin maintenance for a year  3. Followed by adjuvant radiation 4. Followed by adjuvant antiestrogen therapy with tamoxifen that was started prior to surgery --------------------------------------------------------------------  Current treatment: Cycle 4 day 1 of TCH Chemo toxicities: 1. Fatigue 2. Hair loss 3. 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  She will proceed with treatment and we will monitor, should it worsen we will need to consider dose reduction.    Return to clinic  in 3 weeks for cycle 5 Patient made plans to go on a cruise trip the week after she finishes her chemotherapy in February.  I spent 25 minutes talking to the patient of which more than half was spent in counseling and coordination of care.  No orders of the defined types were placed in this encounter.  The patient has a good understanding of the overall plan. she agrees with it. she will call with any problems that may develop before the next visit here.   Rulon Eisenmenger, MD 11/09/17

## 2017-11-09 NOTE — Progress Notes (Signed)
Per Jonelle Sidle, RN for Dr. Lindi Adie, okay to treat with potassium of 3.1

## 2017-11-09 NOTE — Progress Notes (Signed)
Ok to treat with potassium of 3.1 per Dr. Lindi Adie.  Cyndia Bent RN

## 2017-11-09 NOTE — Patient Instructions (Signed)
[facility] Discharge Instructions for Patients Receiving Chemotherapy  Today you received the following chemotherapy agents: Herceptin, Docetaxel and Carboplatin   To help prevent nausea and vomiting after your treatment, we encourage you to take your nausea medication as directed.  If you develop nausea and vomiting that is not controlled by your nausea medication, call the clinic.   BELOW ARE SYMPTOMS THAT SHOULD BE REPORTED IMMEDIATELY:  *FEVER GREATER THAN 100.5 F  *CHILLS WITH OR WITHOUT FEVER  NAUSEA AND VOMITING THAT IS NOT CONTROLLED WITH YOUR NAUSEA MEDICATION  *UNUSUAL SHORTNESS OF BREATH  *UNUSUAL BRUISING OR BLEEDING  TENDERNESS IN MOUTH AND THROAT WITH OR WITHOUT PRESENCE OF ULCERS  *URINARY PROBLEMS  *BOWEL PROBLEMS  UNUSUAL RASH Items with * indicate a potential emergency and should be followed up as soon as possible.  Feel free to call the clinic should you have any questions or concerns. The clinic phone number is [phone].  Please show the [facility] at check-in to the Emergency Department and triage nurse.  Trastuzumab injection for infusion (Herceptin) What is this medicine? TRASTUZUMAB (tras TOO zoo mab) is a monoclonal antibody. It is used to treat breast cancer and stomach cancer. This medicine may be used for other purposes; ask your health care provider or pharmacist if you have questions. COMMON BRAND NAME(S): Herceptin What should I tell my health care provider before I take this medicine? They need to know if you have any of these conditions: -heart disease -heart failure -lung or breathing disease, like asthma -an unusual or allergic reaction to trastuzumab, benzyl alcohol, or other medications, foods, dyes, or preservatives -pregnant or trying to get pregnant -breast-feeding How should I use this medicine? This drug is given as an infusion into a vein. It is administered in a hospital or clinic by a specially  trained health care professional. Talk to your pediatrician regarding the use of this medicine in children. This medicine is not approved for use in children. Overdosage: If you think you have taken too much of this medicine contact a poison control center or emergency room at once. NOTE: This medicine is only for you. Do not share this medicine with others. What if I miss a dose? It is important not to miss a dose. Call your doctor or health care professional if you are unable to keep an appointment. What may interact with this medicine? This medicine may interact with the following medications: -certain types of chemotherapy, such as daunorubicin, doxorubicin, epirubicin, and idarubicin This list may not describe all possible interactions. Give your health care provider a list of all the medicines, herbs, non-prescription drugs, or dietary supplements you use. Also tell them if you smoke, drink alcohol, or use illegal drugs. Some items may interact with your medicine. What should I watch for while using this medicine? Visit your doctor for checks on your progress. Report any side effects. Continue your course of treatment even though you feel ill unless your doctor tells you to stop. Call your doctor or health care professional for advice if you get a fever, chills or sore throat, or other symptoms of a cold or flu. Do not treat yourself. Try to avoid being around people who are sick. You may experience fever, chills and shaking during your first infusion. These effects are usually mild and can be treated with other medicines. Report any side effects during the infusion to your health care professional. Fever and chills usually do not happen with later infusions. Do  not become pregnant while taking this medicine or for 7 months after stopping it. Women should inform their doctor if they wish to become pregnant or think they might be pregnant. Women of child-bearing potential will need to have a  negative pregnancy test before starting this medicine. There is a potential for serious side effects to an unborn child. Talk to your health care professional or pharmacist for more information. Do not breast-feed an infant while taking this medicine or for 7 months after stopping it. Women must use effective birth control with this medicine. What side effects may I notice from receiving this medicine? Side effects that you should report to your doctor or health care professional as soon as possible: -allergic reactions like skin rash, itching or hives, swelling of the face, lips, or tongue -chest pain or palpitations -cough -dizziness -feeling faint or lightheaded, falls -fever -general ill feeling or flu-like symptoms -signs of worsening heart failure like breathing problems; swelling in your legs and feet -unusually weak or tired Side effects that usually do not require medical attention (report to your doctor or health care professional if they continue or are bothersome): -bone pain -changes in taste -diarrhea -joint pain -nausea/vomiting -weight loss This list may not describe all possible side effects. Call your doctor for medical advice about side effects. You may report side effects to FDA at 1-800-FDA-1088. Where should I keep my medicine? This drug is given in a hospital or clinic and will not be stored at home. NOTE: This sheet is a summary. It may not cover all possible information. If you have questions about this medicine, talk to your doctor, pharmacist, or health care provider.  2018 Elsevier/Gold Standard (2016-11-03 14:37:52)  Docetaxel injection What is this medicine? DOCETAXEL (doe se TAX el) is a chemotherapy drug. It targets fast dividing cells, like cancer cells, and causes these cells to die. This medicine is used to treat many types of cancers like breast cancer, certain stomach cancers, head and neck cancer, lung cancer, and prostate cancer. This medicine may  be used for other purposes; ask your health care provider or pharmacist if you have questions. COMMON BRAND NAME(S): Docefrez, Taxotere What should I tell my health care provider before I take this medicine? They need to know if you have any of these conditions: -infection (especially a virus infection such as chickenpox, cold sores, or herpes) -liver disease -low blood counts, like low white cell, platelet, or red cell counts -an unusual or allergic reaction to docetaxel, polysorbate 80, other chemotherapy agents, other medicines, foods, dyes, or preservatives -pregnant or trying to get pregnant -breast-feeding How should I use this medicine? This drug is given as an infusion into a vein. It is administered in a hospital or clinic by a specially trained health care professional. Talk to your pediatrician regarding the use of this medicine in children. Special care may be needed. Overdosage: If you think you have taken too much of this medicine contact a poison control center or emergency room at once. NOTE: This medicine is only for you. Do not share this medicine with others. What if I miss a dose? It is important not to miss your dose. Call your doctor or health care professional if you are unable to keep an appointment. What may interact with this medicine? -cyclosporine -erythromycin -ketoconazole -medicines to increase blood counts like filgrastim, pegfilgrastim, sargramostim -vaccines Talk to your doctor or health care professional before taking any of these medicines: -acetaminophen -aspirin -ibuprofen -ketoprofen -naproxen This list  may not describe all possible interactions. Give your health care provider a list of all the medicines, herbs, non-prescription drugs, or dietary supplements you use. Also tell them if you smoke, drink alcohol, or use illegal drugs. Some items may interact with your medicine. What should I watch for while using this medicine? Your condition will be  monitored carefully while you are receiving this medicine. You will need important blood work done while you are taking this medicine. This drug may make you feel generally unwell. This is not uncommon, as chemotherapy can affect healthy cells as well as cancer cells. Report any side effects. Continue your course of treatment even though you feel ill unless your doctor tells you to stop. In some cases, you may be given additional medicines to help with side effects. Follow all directions for their use. Call your doctor or health care professional for advice if you get a fever, chills or sore throat, or other symptoms of a cold or flu. Do not treat yourself. This drug decreases your body's ability to fight infections. Try to avoid being around people who are sick. This medicine may increase your risk to bruise or bleed. Call your doctor or health care professional if you notice any unusual bleeding. This medicine may contain alcohol in the product. You may get drowsy or dizzy. Do not drive, use machinery, or do anything that needs mental alertness until you know how this medicine affects you. Do not stand or sit up quickly, especially if you are an older patient. This reduces the risk of dizzy or fainting spells. Avoid alcoholic drinks. Do not become pregnant while taking this medicine. Women should inform their doctor if they wish to become pregnant or think they might be pregnant. There is a potential for serious side effects to an unborn child. Talk to your health care professional or pharmacist for more information. Do not breast-feed an infant while taking this medicine. What side effects may I notice from receiving this medicine? Side effects that you should report to your doctor or health care professional as soon as possible: -allergic reactions like skin rash, itching or hives, swelling of the face, lips, or tongue -low blood counts - This drug may decrease the number of white blood cells, red  blood cells and platelets. You may be at increased risk for infections and bleeding. -signs of infection - fever or chills, cough, sore throat, pain or difficulty passing urine -signs of decreased platelets or bleeding - bruising, pinpoint red spots on the skin, black, tarry stools, nosebleeds -signs of decreased red blood cells - unusually weak or tired, fainting spells, lightheadedness -breathing problems -fast or irregular heartbeat -low blood pressure -mouth sores -nausea and vomiting -pain, swelling, redness or irritation at the injection site -pain, tingling, numbness in the hands or feet -swelling of the ankle, feet, hands -weight gain Side effects that usually do not require medical attention (report to your doctor or health care professional if they continue or are bothersome): -bone pain -complete hair loss including hair on your head, underarms, pubic hair, eyebrows, and eyelashes -diarrhea -excessive tearing -changes in the color of fingernails -loosening of the fingernails -nausea -muscle pain -red flush to skin -sweating -weak or tired This list may not describe all possible side effects. Call your doctor for medical advice about side effects. You may report side effects to FDA at 1-800-FDA-1088. Where should I keep my medicine? This drug is given in a hospital or clinic and will not be stored at  home. NOTE: This sheet is a summary. It may not cover all possible information. If you have questions about this medicine, talk to your doctor, pharmacist, or health care provider.  2018 Elsevier/Gold Standard (2015-12-12 12:32:56)  Carboplatin injection What is this medicine? CARBOPLATIN (KAR boe pla tin) is a chemotherapy drug. It targets fast dividing cells, like cancer cells, and causes these cells to die. This medicine is used to treat ovarian cancer and many other cancers. This medicine may be used for other purposes; ask your health care provider or pharmacist if you  have questions. COMMON BRAND NAME(S): Paraplatin What should I tell my health care provider before I take this medicine? They need to know if you have any of these conditions: -blood disorders -hearing problems -kidney disease -recent or ongoing radiation therapy -an unusual or allergic reaction to carboplatin, cisplatin, other chemotherapy, other medicines, foods, dyes, or preservatives -pregnant or trying to get pregnant -breast-feeding How should I use this medicine? This drug is usually given as an infusion into a vein. It is administered in a hospital or clinic by a specially trained health care professional. Talk to your pediatrician regarding the use of this medicine in children. Special care may be needed. Overdosage: If you think you have taken too much of this medicine contact a poison control center or emergency room at once. NOTE: This medicine is only for you. Do not share this medicine with others. What if I miss a dose? It is important not to miss a dose. Call your doctor or health care professional if you are unable to keep an appointment. What may interact with this medicine? -medicines for seizures -medicines to increase blood counts like filgrastim, pegfilgrastim, sargramostim -some antibiotics like amikacin, gentamicin, neomycin, streptomycin, tobramycin -vaccines Talk to your doctor or health care professional before taking any of these medicines: -acetaminophen -aspirin -ibuprofen -ketoprofen -naproxen This list may not describe all possible interactions. Give your health care provider a list of all the medicines, herbs, non-prescription drugs, or dietary supplements you use. Also tell them if you smoke, drink alcohol, or use illegal drugs. Some items may interact with your medicine. What should I watch for while using this medicine? Your condition will be monitored carefully while you are receiving this medicine. You will need important blood work done while you  are taking this medicine. This drug may make you feel generally unwell. This is not uncommon, as chemotherapy can affect healthy cells as well as cancer cells. Report any side effects. Continue your course of treatment even though you feel ill unless your doctor tells you to stop. In some cases, you may be given additional medicines to help with side effects. Follow all directions for their use. Call your doctor or health care professional for advice if you get a fever, chills or sore throat, or other symptoms of a cold or flu. Do not treat yourself. This drug decreases your body's ability to fight infections. Try to avoid being around people who are sick. This medicine may increase your risk to bruise or bleed. Call your doctor or health care professional if you notice any unusual bleeding. Be careful brushing and flossing your teeth or using a toothpick because you may get an infection or bleed more easily. If you have any dental work done, tell your dentist you are receiving this medicine. Avoid taking products that contain aspirin, acetaminophen, ibuprofen, naproxen, or ketoprofen unless instructed by your doctor. These medicines may hide a fever. Do not become pregnant while taking  this medicine. Women should inform their doctor if they wish to become pregnant or think they might be pregnant. There is a potential for serious side effects to an unborn child. Talk to your health care professional or pharmacist for more information. Do not breast-feed an infant while taking this medicine. What side effects may I notice from receiving this medicine? Side effects that you should report to your doctor or health care professional as soon as possible: -allergic reactions like skin rash, itching or hives, swelling of the face, lips, or tongue -signs of infection - fever or chills, cough, sore throat, pain or difficulty passing urine -signs of decreased platelets or bleeding - bruising, pinpoint red spots on  the skin, black, tarry stools, nosebleeds -signs of decreased red blood cells - unusually weak or tired, fainting spells, lightheadedness -breathing problems -changes in hearing -changes in vision -chest pain -high blood pressure -low blood counts - This drug may decrease the number of white blood cells, red blood cells and platelets. You may be at increased risk for infections and bleeding. -nausea and vomiting -pain, swelling, redness or irritation at the injection site -pain, tingling, numbness in the hands or feet -problems with balance, talking, walking -trouble passing urine or change in the amount of urine Side effects that usually do not require medical attention (report to your doctor or health care professional if they continue or are bothersome): -hair loss -loss of appetite -metallic taste in the mouth or changes in taste This list may not describe all possible side effects. Call your doctor for medical advice about side effects. You may report side effects to FDA at 1-800-FDA-1088. Where should I keep my medicine? This drug is given in a hospital or clinic and will not be stored at home. NOTE: This sheet is a summary. It may not cover all possible information. If you have questions about this medicine, talk to your doctor, pharmacist, or health care provider.  2018 Elsevier/Gold Standard (2008-02-14 14:38:05)

## 2017-11-10 ENCOUNTER — Other Ambulatory Visit: Payer: Self-pay

## 2017-11-10 DIAGNOSIS — C50411 Malignant neoplasm of upper-outer quadrant of right female breast: Secondary | ICD-10-CM

## 2017-11-10 DIAGNOSIS — Z170 Estrogen receptor positive status [ER+]: Principal | ICD-10-CM

## 2017-11-10 MED ORDER — DEXAMETHASONE 4 MG PO TABS: 4.0000 mg | ORAL_TABLET | Freq: Every day | ORAL | 0 refills | Status: DC

## 2017-11-10 MED ORDER — POTASSIUM CHLORIDE CRYS ER 20 MEQ PO TBCR: 20.0000 meq | EXTENDED_RELEASE_TABLET | Freq: Every day | ORAL | 0 refills | Status: DC

## 2017-11-10 MED ORDER — LORAZEPAM 0.5 MG PO TABS: 0.5000 mg | ORAL_TABLET | Freq: Every day | ORAL | 0 refills | Status: DC

## 2017-11-10 NOTE — Telephone Encounter (Signed)
Called pt to inform her to start potassium 20 MEQ daily. Script was sent to her pharmacy. She needs a refill on her decadron and ativan and they will be sent as well. She was concerned with a sweet smell. Educated her on using different fragrant oils under the nose may help with that. No other concerns at this time.  Cyndia Bent RN

## 2017-11-11 DIAGNOSIS — L731 Pseudofolliculitis barbae: Secondary | ICD-10-CM | POA: Diagnosis not present

## 2017-11-11 DIAGNOSIS — L700 Acne vulgaris: Secondary | ICD-10-CM | POA: Diagnosis not present

## 2017-11-11 DIAGNOSIS — L819 Disorder of pigmentation, unspecified: Secondary | ICD-10-CM | POA: Diagnosis not present

## 2017-11-29 NOTE — Assessment & Plan Note (Signed)
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 Resection of the positive margin 2. adjuvant chemotherapy with TCH followed by Herceptin maintenance for a year  3. Followed by adjuvant radiation 4. Followed by adjuvant antiestrogen therapy with tamoxifen that was started prior to surgery --------------------------------------------------------------------  Current treatment: Cycle 5 day 1 of TCH Chemo toxicities: 1. Fatigue 2. Hair loss 3. 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  She will proceed with treatment and we will monitor, should it worsen we will need to consider dose reduction.    Return to clinic in 3 weeks for cycle 6 Patient made plans to go on a cruise trip the week after she finishes her chemotherapy in February.

## 2017-11-30 ENCOUNTER — Inpatient Hospital Stay: Payer: 59

## 2017-11-30 ENCOUNTER — Inpatient Hospital Stay: Payer: 59 | Attending: Hematology and Oncology | Admitting: Hematology and Oncology

## 2017-11-30 DIAGNOSIS — Z5111 Encounter for antineoplastic chemotherapy: Secondary | ICD-10-CM | POA: Diagnosis not present

## 2017-11-30 DIAGNOSIS — Z95828 Presence of other vascular implants and grafts: Secondary | ICD-10-CM

## 2017-11-30 DIAGNOSIS — T451X5A Adverse effect of antineoplastic and immunosuppressive drugs, initial encounter: Secondary | ICD-10-CM | POA: Insufficient documentation

## 2017-11-30 DIAGNOSIS — R530 Neoplastic (malignant) related fatigue: Secondary | ICD-10-CM

## 2017-11-30 DIAGNOSIS — E876 Hypokalemia: Secondary | ICD-10-CM | POA: Diagnosis not present

## 2017-11-30 DIAGNOSIS — Z170 Estrogen receptor positive status [ER+]: Secondary | ICD-10-CM | POA: Diagnosis not present

## 2017-11-30 DIAGNOSIS — C50411 Malignant neoplasm of upper-outer quadrant of right female breast: Secondary | ICD-10-CM | POA: Insufficient documentation

## 2017-11-30 DIAGNOSIS — G620 Drug-induced polyneuropathy: Secondary | ICD-10-CM | POA: Diagnosis not present

## 2017-11-30 DIAGNOSIS — Z5112 Encounter for antineoplastic immunotherapy: Secondary | ICD-10-CM | POA: Diagnosis not present

## 2017-11-30 DIAGNOSIS — L658 Other specified nonscarring hair loss: Secondary | ICD-10-CM

## 2017-11-30 LAB — CBC WITH DIFFERENTIAL/PLATELET
Abs Granulocyte: 3.7 10*3/uL (ref 1.5–6.5)
BASOS ABS: 0 10*3/uL (ref 0.0–0.1)
Basophils Relative: 0 %
EOS ABS: 0.1 10*3/uL (ref 0.0–0.5)
Eosinophils Relative: 1 %
HCT: 31.2 % — ABNORMAL LOW (ref 34.8–46.6)
HEMOGLOBIN: 10.3 g/dL — AB (ref 11.6–15.9)
LYMPHS ABS: 3.1 10*3/uL (ref 0.9–3.3)
Lymphocytes Relative: 42 %
MCH: 32.5 pg (ref 25.1–34.0)
MCHC: 33 g/dL (ref 31.5–36.0)
MCV: 98.4 fL (ref 79.5–101.0)
MONOS PCT: 8 %
Monocytes Absolute: 0.6 10*3/uL (ref 0.1–0.9)
NEUTROS PCT: 49 %
Neutro Abs: 3.7 10*3/uL (ref 1.5–6.5)
Platelets: 175 10*3/uL (ref 145–400)
RBC: 3.17 MIL/uL — ABNORMAL LOW (ref 3.70–5.45)
RDW: 19.9 % — ABNORMAL HIGH (ref 11.2–16.1)
WBC: 7.5 10*3/uL (ref 3.9–10.3)

## 2017-11-30 LAB — COMPREHENSIVE METABOLIC PANEL
ALBUMIN: 3.4 g/dL — AB (ref 3.5–5.0)
ALT: 65 U/L — ABNORMAL HIGH (ref 0–55)
ANION GAP: 7 (ref 3–11)
AST: 41 U/L — ABNORMAL HIGH (ref 5–34)
Alkaline Phosphatase: 60 U/L (ref 40–150)
BUN: 12 mg/dL (ref 7–26)
CALCIUM: 8.7 mg/dL (ref 8.4–10.4)
CO2: 25 mmol/L (ref 22–29)
Chloride: 106 mmol/L (ref 98–109)
Creatinine, Ser: 0.92 mg/dL (ref 0.60–1.10)
GFR calc non Af Amer: >60 mL/min (ref >60)
GLUCOSE: 116 mg/dL (ref 70–140)
Potassium: 3.3 mmol/L (ref 3.3–4.7)
SODIUM: 138 mmol/L (ref 136–145)
Total Bilirubin: 0.3 mg/dL (ref 0.2–1.2)
Total Protein: 6.5 g/dL (ref 6.4–8.3)

## 2017-11-30 MED ORDER — DIPHENHYDRAMINE HCL 25 MG PO CAPS
ORAL_CAPSULE | ORAL | Status: AC
  Filled 2017-11-30: qty 2

## 2017-11-30 MED ORDER — HEPARIN SOD (PORK) LOCK FLUSH 100 UNIT/ML IV SOLN
500.0000 [IU] | Freq: Once | INTRAVENOUS | Status: AC | PRN
  Administered 2017-11-30: 500 [IU]
  Filled 2017-11-30: qty 5

## 2017-11-30 MED ORDER — SODIUM CHLORIDE 0.9 % IV SOLN
Freq: Once | INTRAVENOUS | Status: AC
  Administered 2017-11-30: 14:00:00 via INTRAVENOUS

## 2017-11-30 MED ORDER — CARBOPLATIN CHEMO INJECTION 600 MG/60ML
600.0000 mg | Freq: Once | INTRAVENOUS | Status: AC
  Administered 2017-11-30: 600 mg via INTRAVENOUS
  Filled 2017-11-30: qty 60

## 2017-11-30 MED ORDER — SODIUM CHLORIDE 0.9% FLUSH
10.0000 mL | INTRAVENOUS | Status: DC | PRN
  Administered 2017-11-30: 10 mL via INTRAVENOUS
  Filled 2017-11-30: qty 10

## 2017-11-30 MED ORDER — DEXAMETHASONE SODIUM PHOSPHATE 10 MG/ML IJ SOLN
INTRAMUSCULAR | Status: AC
  Filled 2017-11-30: qty 1

## 2017-11-30 MED ORDER — ACETAMINOPHEN 325 MG PO TABS
ORAL_TABLET | ORAL | Status: AC
  Filled 2017-11-30: qty 2

## 2017-11-30 MED ORDER — ACETAMINOPHEN 325 MG PO TABS
650.0000 mg | ORAL_TABLET | Freq: Once | ORAL | Status: AC
  Administered 2017-11-30: 650 mg via ORAL

## 2017-11-30 MED ORDER — SODIUM CHLORIDE 0.9% FLUSH
10.0000 mL | INTRAVENOUS | Status: DC | PRN
  Administered 2017-11-30: 10 mL
  Filled 2017-11-30: qty 10

## 2017-11-30 MED ORDER — TRASTUZUMAB CHEMO 150 MG IV SOLR
450.0000 mg | Freq: Once | INTRAVENOUS | Status: AC
  Administered 2017-11-30: 450 mg via INTRAVENOUS
  Filled 2017-11-30: qty 21.43

## 2017-11-30 MED ORDER — PALONOSETRON HCL INJECTION 0.25 MG/5ML
INTRAVENOUS | Status: AC
  Filled 2017-11-30: qty 5

## 2017-11-30 MED ORDER — DIPHENHYDRAMINE HCL 25 MG PO CAPS
50.0000 mg | ORAL_CAPSULE | Freq: Once | ORAL | Status: AC
  Administered 2017-11-30: 50 mg via ORAL

## 2017-11-30 MED ORDER — PALONOSETRON HCL INJECTION 0.25 MG/5ML
0.2500 mg | Freq: Once | INTRAVENOUS | Status: AC
  Administered 2017-11-30: 0.25 mg via INTRAVENOUS

## 2017-11-30 MED ORDER — DEXAMETHASONE SODIUM PHOSPHATE 10 MG/ML IJ SOLN
10.0000 mg | Freq: Once | INTRAMUSCULAR | Status: AC
  Administered 2017-11-30: 10 mg via INTRAVENOUS

## 2017-11-30 NOTE — Patient Instructions (Signed)
[facility] Discharge Instructions for Patients Receiving Chemotherapy  Today you received the following chemotherapy agents Herceptin and Carboplatin  To help prevent nausea and vomiting after your treatment, we encourage you to take your nausea medication as directed If you develop nausea and vomiting that is not controlled by your nausea medication, call the clinic.   BELOW ARE SYMPTOMS THAT SHOULD BE REPORTED IMMEDIATELY:  *FEVER GREATER THAN 100.5 F  *CHILLS WITH OR WITHOUT FEVER  NAUSEA AND VOMITING THAT IS NOT CONTROLLED WITH YOUR NAUSEA MEDICATION  *UNUSUAL SHORTNESS OF BREATH  *UNUSUAL BRUISING OR BLEEDING  TENDERNESS IN MOUTH AND THROAT WITH OR WITHOUT PRESENCE OF ULCERS  *URINARY PROBLEMS  *BOWEL PROBLEMS  UNUSUAL RASH Items with * indicate a potential emergency and should be followed up as soon as possible.  Feel free to call the clinic should you have any questions or concerns. The clinic phone number is [phone].  Please show the CHEMO ALERT CARD at check-in to the Emergency Department and triage nurse.   

## 2017-11-30 NOTE — Progress Notes (Signed)
Patient Care Team: [name], [name], MD as PCP - General (Family Medicine)  DIAGNOSIS:  Encounter Diagnosis  Name Primary?  . Malignant neoplasm of upper-outer quadrant of right breast in female, estrogen receptor positive ([place])     SUMMARY OF ONCOLOGIC HISTORY:   Malignant neoplasm of upper-outer quadrant of right breast in female, estrogen receptor positive ([place])   07/15/2017 Initial Diagnosis    Patient palpated a week mass in the right upper outer quadrant breast near the axilla in March 2018 mammogram revealed irregular spiculated mass 1.4 cm, by ultrasound measured 1.1 cm with a suspicious right axillary lymph node; biopsy revealed IDC grade 3, lymph node benign, ER 90%, PR 0%, Ki-67 40%, HER-2 positive ratio 2.6, T1c N0 stage IA, AJCC 8 clinical stage      08/06/2017 Genetic Testing    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  The Hereditary Gene Panel offered by [facility] includes sequencing and/or deletion duplication testing of the following 46 genes: APC, ATM, AXIN2, BARD1, BMPR1A, BRCA1, BRCA2, BRIP1, CDH1, CDKN2A (p14ARF), CDKN2A (p16INK4a), CHEK2, CTNNA1, DICER1, EPCAM (Deletion/duplication testing only), GREM1 (promoter region deletion/duplication testing only), KIT, MEN1, MLH1, MSH2, MSH3, MSH6, MUTYH, NBN, NF1, NHTL1, PALB2, PDGFRA, PMS2, POLD1, POLE, PTEN, RAD50, RAD51C, RAD51D, SDHB, SDHC, SDHD, SMAD4, SMARCA4. STK11, TP53, TSC1, TSC2, and VHL.  The following genes were evaluated for sequence changes only: SDHA and HOXB13 c.251G>A variant only.  The report date is August 06, 2017.      08/11/2017 Surgery    Right lumpectomy: IDC with DCIS, 1.4 cm, grade 3, DCIS focally involving posterior margin, 0/3 lymph nodes negative, ER 90%, PR 0%, HER-2 positive ratio 2.6, Ki-67 40% T1 CN 0 stage IA      09/07/2017 -  Adjuvant Chemotherapy    TCH x 6, then maintenance Herceptin x 1 year       CHIEF COMPLIANT: Cycle 5 TCH  INTERVAL  HISTORY: Brianna [name] is a 37 year old with above-mentioned history of right breast cancer treated with lumpectomy and is now on adjuvant chemotherapy today cycle 5 of TCH.  Overall she is tolerating the chemo reasonably well.  She does have fatigue nausea as well as decreased appetite.  She denies any vomiting.  Denies any diarrhea.  She continues to have worsening neuropathy in the feet.  It started as being severe pain in the feet with inability to walk for several days.  It is now numbness up in the fingers and the toes along with skin discoloration of her feet.  She had such severe pain to the point that she was unable to walk to the shop.  REVIEW OF SYSTEMS:   Constitutional: Denies fevers, chills or abnormal weight loss Eyes: Denies blurriness of vision Ears, nose, mouth, throat, and face: Denies mucositis or sore throat Respiratory: Denies cough, dyspnea or wheezes Cardiovascular: Denies palpitation, chest discomfort Gastrointestinal:  Denies nausea, heartburn or change in bowel habits Skin: Denies abnormal skin rashes Lymphatics: Denies new lymphadenopathy or easy bruising Neurological: Moderate neuropathy of the feet with skin discoloration Behavioral/Psych: Mood is stable, no new changes  Extremities: No lower extremity edema Breast:  denies any pain or lumps or nodules in either breasts All other systems were reviewed with the patient and are negative.  I have reviewed the past medical history, past surgical history, social history and family history with the patient and they are unchanged from previous note.  ALLERGIES:  has No Known Allergies.  MEDICATIONS:  Current Outpatient Medications  Medication Sig Dispense Refill  . Clindamycin-Benzoyl Per, Refr, gel Apply to face once daily in the morning.    Marland Kitchen dexamethasone (DECADRON) 4 MG tablet Take 1 tablet (4 mg total) by mouth daily. Take 1 tablet daily before chemotherapy and one tablet day after chemotherapy 4 tablet 0  .  Famotidine (PEPCID AC PO) Take by mouth daily.    Marland Kitchen HYDROcodone-acetaminophen (NORCO/VICODIN) 5-325 MG tablet Take 1 tablet by mouth every 6 (six) hours as needed for moderate pain. (Patient not taking: Reported on 10/12/2017) 20 tablet 0  . lidocaine-prilocaine (EMLA) cream Apply to affected area once 30 g 3  . LORazepam (ATIVAN) 0.5 MG tablet Take 1 tablet (0.5 mg total) by mouth at bedtime. As needed for sleep 30 tablet 0  . naproxen sodium (ANAPROX) 220 MG tablet Take 220 mg by mouth as needed.    . ondansetron (ZOFRAN) 8 MG tablet Take 1 tablet (8 mg total) by mouth 2 (two) times daily as needed for refractory nausea / vomiting. Start on day 3 after chemo. 30 tablet 1  . potassium chloride SA (K-DUR,KLOR-CON) 20 MEQ tablet Take 1 tablet (20 mEq total) by mouth daily. 30 tablet 0  . prochlorperazine (COMPAZINE) 10 MG tablet Take 1 tablet (10 mg total) by mouth every 6 (six) hours as needed (Nausea or vomiting). 30 tablet 1  . spironolactone (ALDACTONE) 100 MG tablet Take 100 mg by mouth daily.    . Sulfacetamide Sodium-Sulfur (CLENIA FOAMING [place]) 10-5 % EMUL Apply topically.    . tretinoin (RETIN-A) 0.025 % gel Apply to face nightly     No current facility-administered medications for this visit.     PHYSICAL EXAMINATION: ECOG PERFORMANCE STATUS: 1 - Symptomatic but completely ambulatory  Vitals:   11/30/17 1334  BP: 111/68  Pulse: 92  Resp: 18  Temp: 98 F (36.7 C)  SpO2: 100%   Filed Weights   11/30/17 1334  Weight: 174 lb (78.9 kg)    GENERAL:alert, no distress and comfortable SKIN: skin color, texture, turgor are normal, no rashes or significant lesions EYES: normal, Conjunctiva are pink and non-injected, sclera clear OROPHARYNX:no exudate, no erythema and lips, buccal mucosa, and tongue normal  NECK: supple, thyroid normal size, non-tender, without nodularity LYMPH:  no palpable lymphadenopathy in the cervical, axillary or inguinal LUNGS: clear to auscultation and  percussion with normal breathing effort HEART: regular rate & rhythm and no murmurs and no lower extremity edema ABDOMEN:abdomen soft, non-tender and normal bowel sounds MUSCULOSKELETAL:no cyanosis of digits and no clubbing  NEURO: alert & oriented x 3 with fluent speech, grade 3 peripheral neuropathy accompanied by pain EXTREMITIES: No lower extremity edema  LABORATORY DATA:  I have reviewed the data as listed CMP Latest Ref Rng & Units 11/30/2017 11/09/2017 10/19/2017  Glucose 70 - 140 mg/dL 116 127 112  BUN 7 - 26 mg/dL 12 14.0 13.0  Creatinine 0.60 - 1.10 mg/dL 0.92 1.0 1.0  Sodium 136 - 145 mmol/L 138 138 139  Potassium 3.3 - 4.7 mmol/L 3.3 3.1(L) 3.4(L)  Chloride 98 - 109 mmol/L 106 - -  CO2 22 - 29 mmol/L _0 Calcium 8.4 - 10.4 mg/dL 8.7 8.7 9.2  Total Protein 6.4 - 8.3 g/dL 6.5 6.5 7.0  Total Bilirubin 0.2 - 1.2 mg/dL 0.3 <0.22 0.29  Alkaline Phos 40 - 150 U/L 60 58 63  AST 5 - 34 U/L 41(H) 15 24  ALT 0 - 55 U/L 65(H) 19 67(H)    Lab Results  Component Value Date   WBC 7.5 11/30/2017   HGB 10.3 (L) 11/30/2017   HCT 31.2 (L) 11/30/2017   MCV 98.4 11/30/2017   PLT 175 11/30/2017   NEUTROABS 3.7 11/30/2017    ASSESSMENT & PLAN:  Malignant neoplasm of upper-outer quadrant of right breast in female, estrogen receptor positive ([place]) Right lumpectomy: IDC with DCIS, 1.4 cm, grade 3, DCIS focally involving posterior margin, 0/3 lymph nodes negative, ER 90%, PR 0%, HER-2 positive ratio 2.6, Ki-67 40% T1 CN 0 stage IA Tumor board did not recommend surgery for the positive posterior margin issue  Recommendation: 1. Resection of the positive margin 2. adjuvant chemotherapy with TCH followed by Herceptin maintenance for a year  3. Followed by adjuvant radiation 4. Followed by adjuvant antiestrogen therapy with tamoxifen that was started prior to surgery --------------------------------------------------------------------  Current treatment: Cycle  5 day 1 of TCH  (Taxotere discontinued with cycle 5) Chemo toxicities: 1. Fatigue 2. Hair loss 3. Peripheral neuropathy: Because of severe peripheral neuropathy, I recommended that we discontinue Taxotere.   I will consider treatment with Neurontin if her symptoms continue to get worse. The dose of carboplatin was also reduced today. Neulasta will be discontinued.  4.  Hypokalemia: Potassium today is 3.3.   Return to clinic in 3 weeks for cycle 6 Patient made plans to go on a cruise trip the week after she finishes her chemotherapy in February.  I spent 25 minutes talking to the patient of which more than half was spent in counseling and coordination of care.  No orders of the defined types were placed in this encounter.  The patient has a good understanding of the overall plan. she agrees with it. she will call with any problems that may develop before the next visit here.   Harriette Ohara, MD 11/30/17

## 2017-12-20 NOTE — Assessment & Plan Note (Signed)
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 Resection of the positive margin 2. adjuvant chemotherapy with TCH followed by Herceptin maintenance for a year  3. Followed by adjuvant radiation 4. Followed by adjuvant antiestrogen therapy with tamoxifen that was started prior to surgery --------------------------------------------------------------------  Current treatment: Cycle 6day 1 of TCH (Taxotere discontinued with cycle 5) Chemo toxicities: 1. Fatigue 2. Hair loss 3. Peripheral neuropathy: Because of severe peripheral neuropathy, I recommended that we discontinue Taxotere.   I will consider treatment with Neurontin if her symptoms continue to get worse. The dose of carboplatin was also reduced today. Neulasta will be discontinued.  4.  Hypokalemia: Potassium today is 3.3.   Referal to XRT Follow up after radiation Patient made plans to go on a cruise trip the week after she finishes her chemotherapy in February.

## 2017-12-21 ENCOUNTER — Telehealth: Payer: Self-pay | Admitting: Hematology and Oncology

## 2017-12-21 ENCOUNTER — Encounter: Payer: Self-pay | Admitting: *Deleted

## 2017-12-21 ENCOUNTER — Inpatient Hospital Stay: Payer: 59

## 2017-12-21 ENCOUNTER — Inpatient Hospital Stay (HOSPITAL_BASED_OUTPATIENT_CLINIC_OR_DEPARTMENT_OTHER): Payer: 59 | Admitting: Hematology and Oncology

## 2017-12-21 DIAGNOSIS — G620 Drug-induced polyneuropathy: Secondary | ICD-10-CM | POA: Diagnosis not present

## 2017-12-21 DIAGNOSIS — T451X5A Adverse effect of antineoplastic and immunosuppressive drugs, initial encounter: Secondary | ICD-10-CM

## 2017-12-21 DIAGNOSIS — Z170 Estrogen receptor positive status [ER+]: Secondary | ICD-10-CM | POA: Diagnosis not present

## 2017-12-21 DIAGNOSIS — C50411 Malignant neoplasm of upper-outer quadrant of right female breast: Secondary | ICD-10-CM | POA: Diagnosis not present

## 2017-12-21 DIAGNOSIS — Z95828 Presence of other vascular implants and grafts: Secondary | ICD-10-CM

## 2017-12-21 DIAGNOSIS — L658 Other specified nonscarring hair loss: Secondary | ICD-10-CM | POA: Diagnosis not present

## 2017-12-21 DIAGNOSIS — R530 Neoplastic (malignant) related fatigue: Secondary | ICD-10-CM | POA: Diagnosis not present

## 2017-12-21 DIAGNOSIS — E876 Hypokalemia: Secondary | ICD-10-CM

## 2017-12-21 DIAGNOSIS — Z5111 Encounter for antineoplastic chemotherapy: Secondary | ICD-10-CM | POA: Diagnosis not present

## 2017-12-21 LAB — CBC WITH DIFFERENTIAL/PLATELET
BASOS PCT: 0 %
Basophils Absolute: 0 10*3/uL (ref 0.0–0.1)
EOS ABS: 0 10*3/uL (ref 0.0–0.5)
Eosinophils Relative: 0 %
HCT: 33.3 % — ABNORMAL LOW (ref 34.8–46.6)
HEMOGLOBIN: 10.9 g/dL — AB (ref 11.6–15.9)
Lymphocytes Relative: 30 %
Lymphs Abs: 1.2 10*3/uL (ref 0.9–3.3)
MCH: 33.3 pg (ref 25.1–34.0)
MCHC: 32.7 g/dL (ref 31.5–36.0)
MCV: 101.7 fL — ABNORMAL HIGH (ref 79.5–101.0)
MONOS PCT: 3 %
Monocytes Absolute: 0.1 10*3/uL (ref 0.1–0.9)
NEUTROS PCT: 67 %
Neutro Abs: 2.7 10*3/uL (ref 1.5–6.5)
PLATELETS: 106 10*3/uL — AB (ref 145–400)
RBC: 3.27 MIL/uL — ABNORMAL LOW (ref 3.70–5.45)
RDW: 19.4 % — ABNORMAL HIGH (ref 11.2–16.1)
WBC: 4 10*3/uL (ref 3.9–10.3)

## 2017-12-21 LAB — COMPREHENSIVE METABOLIC PANEL
ALBUMIN: 3.7 g/dL (ref 3.5–5.0)
ALK PHOS: 58 U/L (ref 40–150)
ALT: 21 U/L (ref 0–55)
ANION GAP: 9 (ref 3–11)
AST: 15 U/L (ref 5–34)
BUN: 11 mg/dL (ref 7–26)
CHLORIDE: 107 mmol/L (ref 98–109)
CO2: 24 mmol/L (ref 22–29)
Calcium: 9.4 mg/dL (ref 8.4–10.4)
Creatinine, Ser: 0.89 mg/dL (ref 0.60–1.10)
GFR calc Af Amer: >60 mL/min (ref >60)
GFR calc non Af Amer: >60 mL/min (ref >60)
GLUCOSE: 164 mg/dL — AB (ref 70–140)
Potassium: 3.8 mmol/L (ref 3.3–4.7)
SODIUM: 140 mmol/L (ref 136–145)
Total Bilirubin: 0.3 mg/dL (ref 0.2–1.2)
Total Protein: 7.2 g/dL (ref 6.4–8.3)

## 2017-12-21 MED ORDER — SODIUM CHLORIDE 0.9% FLUSH
10.0000 mL | INTRAVENOUS | Status: DC | PRN
  Administered 2017-12-21: 10 mL
  Filled 2017-12-21: qty 10

## 2017-12-21 MED ORDER — SODIUM CHLORIDE 0.9 % IV SOLN
600.0000 mg | Freq: Once | INTRAVENOUS | Status: AC
  Administered 2017-12-21: 600 mg via INTRAVENOUS
  Filled 2017-12-21: qty 60

## 2017-12-21 MED ORDER — PALONOSETRON HCL INJECTION 0.25 MG/5ML
INTRAVENOUS | Status: AC
  Filled 2017-12-21: qty 5

## 2017-12-21 MED ORDER — ACETAMINOPHEN 325 MG PO TABS
650.0000 mg | ORAL_TABLET | Freq: Once | ORAL | Status: AC
  Administered 2017-12-21: 650 mg via ORAL

## 2017-12-21 MED ORDER — SODIUM CHLORIDE 0.9 % IV SOLN
Freq: Once | INTRAVENOUS | Status: AC
Start: 2017-12-21 — End: 2017-12-21
  Administered 2017-12-21: 15:00:00 via INTRAVENOUS

## 2017-12-21 MED ORDER — DEXAMETHASONE SODIUM PHOSPHATE 10 MG/ML IJ SOLN
10.0000 mg | Freq: Once | INTRAMUSCULAR | Status: AC
  Administered 2017-12-21: 10 mg via INTRAVENOUS

## 2017-12-21 MED ORDER — DEXAMETHASONE SODIUM PHOSPHATE 10 MG/ML IJ SOLN
INTRAMUSCULAR | Status: AC
  Filled 2017-12-21: qty 1

## 2017-12-21 MED ORDER — DIPHENHYDRAMINE HCL 25 MG PO CAPS
50.0000 mg | ORAL_CAPSULE | Freq: Once | ORAL | Status: AC
  Administered 2017-12-21: 50 mg via ORAL

## 2017-12-21 MED ORDER — DIPHENHYDRAMINE HCL 25 MG PO CAPS
ORAL_CAPSULE | ORAL | Status: AC
  Filled 2017-12-21: qty 2

## 2017-12-21 MED ORDER — PALONOSETRON HCL INJECTION 0.25 MG/5ML
0.2500 mg | Freq: Once | INTRAVENOUS | Status: AC
Start: 2017-12-21 — End: 2017-12-21
  Administered 2017-12-21: 0.25 mg via INTRAVENOUS

## 2017-12-21 MED ORDER — TRASTUZUMAB CHEMO 150 MG IV SOLR
450.0000 mg | Freq: Once | INTRAVENOUS | Status: AC
  Administered 2017-12-21: 450 mg via INTRAVENOUS
  Filled 2017-12-21: qty 21.43

## 2017-12-21 MED ORDER — HEPARIN SOD (PORK) LOCK FLUSH 100 UNIT/ML IV SOLN
500.0000 [IU] | Freq: Once | INTRAVENOUS | Status: AC | PRN
  Administered 2017-12-21: 500 [IU]
  Filled 2017-12-21: qty 5

## 2017-12-21 MED ORDER — ACETAMINOPHEN 325 MG PO TABS
ORAL_TABLET | ORAL | Status: AC
  Filled 2017-12-21: qty 2

## 2017-12-21 MED ORDER — SODIUM CHLORIDE 0.9% FLUSH
10.0000 mL | INTRAVENOUS | Status: DC | PRN
  Administered 2017-12-21: 10 mL via INTRAVENOUS
  Filled 2017-12-21: qty 10

## 2017-12-21 NOTE — Telephone Encounter (Signed)
Gave patient avs and calendar with appts per 1/29 los.  °

## 2017-12-21 NOTE — Patient Instructions (Signed)
[facility] Discharge Instructions for Patients Receiving Chemotherapy  Today you received the following chemotherapy agents:  Herceptin (trastuzumab), Carboplatin (paraplatin)  To help prevent nausea and vomiting after your treatment, we encourage you to take your nausea medication as prescribed.   If you develop nausea and vomiting that is not controlled by your nausea medication, call the clinic.   BELOW ARE SYMPTOMS THAT SHOULD BE REPORTED IMMEDIATELY:  *FEVER GREATER THAN 100.5 F  *CHILLS WITH OR WITHOUT FEVER  NAUSEA AND VOMITING THAT IS NOT CONTROLLED WITH YOUR NAUSEA MEDICATION  *UNUSUAL SHORTNESS OF BREATH  *UNUSUAL BRUISING OR BLEEDING  TENDERNESS IN MOUTH AND THROAT WITH OR WITHOUT PRESENCE OF ULCERS  *URINARY PROBLEMS  *BOWEL PROBLEMS  UNUSUAL RASH Items with * indicate a potential emergency and should be followed up as soon as possible.  Feel free to call the clinic should you have any questions or concerns. The clinic phone number is [phone].  Please show the [facility] at check-in to the Emergency Department and triage nurse.

## 2017-12-21 NOTE — Progress Notes (Signed)
Patient Care Team: [name], [name], MD as PCP - General (Family Medicine)  DIAGNOSIS:  Encounter Diagnosis  Name Primary?  . Malignant neoplasm of upper-outer quadrant of right breast in female, estrogen receptor positive ([place])     SUMMARY OF ONCOLOGIC HISTORY:   Malignant neoplasm of upper-outer quadrant of right breast in female, estrogen receptor positive ([place])   07/15/2017 Initial Diagnosis    Patient palpated a week mass in the right upper outer quadrant breast near the axilla in March 2018 mammogram revealed irregular spiculated mass 1.4 cm, by ultrasound measured 1.1 cm with a suspicious right axillary lymph node; biopsy revealed IDC grade 3, lymph node benign, ER 90%, PR 0%, Ki-67 40%, HER-2 positive ratio 2.6, T1c N0 stage IA, AJCC 8 clinical stage      08/06/2017 Genetic Testing    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  The Hereditary Gene Panel offered by [facility] includes sequencing and/or deletion duplication testing of the following 46 genes: APC, ATM, AXIN2, BARD1, BMPR1A, BRCA1, BRCA2, BRIP1, CDH1, CDKN2A (p14ARF), CDKN2A (p16INK4a), CHEK2, CTNNA1, DICER1, EPCAM (Deletion/duplication testing only), GREM1 (promoter region deletion/duplication testing only), KIT, MEN1, MLH1, MSH2, MSH3, MSH6, MUTYH, NBN, NF1, NHTL1, PALB2, PDGFRA, PMS2, POLD1, POLE, PTEN, RAD50, RAD51C, RAD51D, SDHB, SDHC, SDHD, SMAD4, SMARCA4. STK11, TP53, TSC1, TSC2, and VHL.  The following genes were evaluated for sequence changes only: SDHA and HOXB13 c.251G>A variant only.  The report date is August 06, 2017.      08/11/2017 Surgery    Right lumpectomy: IDC with DCIS, 1.4 cm, grade 3, DCIS focally involving posterior margin, 0/3 lymph nodes negative, ER 90%, PR 0%, HER-2 positive ratio 2.6, Ki-67 40% T1 CN 0 stage IA      09/07/2017 -  Adjuvant Chemotherapy    TCH x 6, then maintenance Herceptin x 1 year       CHIEF COMPLIANT: TCH cycle 6, without  Taxotere  INTERVAL HISTORY: Brianna [name] is a 37 year old with above-mentioned history of right breast cancer treated with lumpectomy and is currently on adjuvant chemotherapy and today is cycle 6 of [place].  We did not give Taxotere with a last cycle and she tolerated the chemo extremely well.  She still has significant fatigue.  Denies any nausea or vomiting.  She also tells me that she has been mean to her family.  REVIEW OF SYSTEMS:   Constitutional: Denies fevers, chills or abnormal weight loss Eyes: Denies blurriness of vision Ears, nose, mouth, throat, and face: Denies mucositis or sore throat Respiratory: Denies cough, dyspnea or wheezes Cardiovascular: Denies palpitation, chest discomfort Gastrointestinal:  Denies nausea, heartburn or change in bowel habits Skin: Denies abnormal skin rashes Lymphatics: Denies new lymphadenopathy or easy bruising Neurological:Denies numbness, tingling or new weaknesses Behavioral/Psych: Mood is stable, no new changes  Extremities: No lower extremity edema  All other systems were reviewed with the patient and are negative.  I have reviewed the past medical history, past surgical history, social history and family history with the patient and they are unchanged from previous note.  ALLERGIES:  has No Known Allergies.  MEDICATIONS:  Current Outpatient Medications  Medication Sig Dispense Refill  . Clindamycin-Benzoyl Per, Refr, gel Apply to face once daily in the morning.    Marland Kitchen dexamethasone (DECADRON) 4 MG tablet Take 1 tablet (4 mg total) by mouth daily. Take 1 tablet daily before chemotherapy and one tablet day after chemotherapy 4 tablet 0  . Famotidine (PEPCID AC PO) Take by mouth  daily.    Marland Kitchen HYDROcodone-acetaminophen (NORCO/VICODIN) 5-325 MG tablet Take 1 tablet by mouth every 6 (six) hours as needed for moderate pain. (Patient not taking: Reported on 10/12/2017) 20 tablet 0  . lidocaine-prilocaine (EMLA) cream Apply to affected area once 30 g  3  . LORazepam (ATIVAN) 0.5 MG tablet Take 1 tablet (0.5 mg total) by mouth at bedtime. As needed for sleep 30 tablet 0  . naproxen sodium (ANAPROX) 220 MG tablet Take 220 mg by mouth as needed.    . ondansetron (ZOFRAN) 8 MG tablet Take 1 tablet (8 mg total) by mouth 2 (two) times daily as needed for refractory nausea / vomiting. Start on day 3 after chemo. 30 tablet 1  . potassium chloride SA (K-DUR,KLOR-CON) 20 MEQ tablet Take 1 tablet (20 mEq total) by mouth daily. 30 tablet 0  . prochlorperazine (COMPAZINE) 10 MG tablet Take 1 tablet (10 mg total) by mouth every 6 (six) hours as needed (Nausea or vomiting). 30 tablet 1  . spironolactone (ALDACTONE) 100 MG tablet Take 100 mg by mouth daily.    . Sulfacetamide Sodium-Sulfur (CLENIA FOAMING [place]) 10-5 % EMUL Apply topically.    . tretinoin (RETIN-A) 0.025 % gel Apply to face nightly     No current facility-administered medications for this visit.     PHYSICAL EXAMINATION: ECOG PERFORMANCE STATUS: 1 - Symptomatic but completely ambulatory  Vitals:   12/21/17 1340  BP: 110/71  Pulse: 96  Resp: 18  Temp: 98.5 F (36.9 C)  SpO2: 100%   Filed Weights   12/21/17 1340  Weight: 170 lb 6.4 oz (77.3 kg)    GENERAL:alert, no distress and comfortable SKIN: skin color, texture, turgor are normal, no rashes or significant lesions EYES: normal, Conjunctiva are pink and non-injected, sclera clear OROPHARYNX:no exudate, no erythema and lips, buccal mucosa, and tongue normal  NECK: supple, thyroid normal size, non-tender, without nodularity LYMPH:  no palpable lymphadenopathy in the cervical, axillary or inguinal LUNGS: clear to auscultation and percussion with normal breathing effort HEART: regular rate & rhythm and no murmurs and no lower extremity edema ABDOMEN:abdomen soft, non-tender and normal bowel sounds MUSCULOSKELETAL:no cyanosis of digits and no clubbing  NEURO: alert & oriented x 3 with fluent speech, no focal motor/sensory  deficits EXTREMITIES: No lower extremity edema  LABORATORY DATA:  I have reviewed the data as listed CMP Latest Ref Rng & Units 12/21/2017 11/30/2017 11/09/2017  Glucose 70 - 140 mg/dL 164(H) 116 127  BUN 7 - 26 mg/dL 11 12 14.0  Creatinine 0.60 - 1.10 mg/dL 0.89 0.92 1.0  Sodium 136 - 145 mmol/L 140 138 138  Potassium 3.3 - 4.7 mmol/L 3.8 3.3 3.1(L)  Chloride 98 - 109 mmol/L 107 106 -  CO2 22 - 29 mmol/L '24 25 23  '$ Calcium 8.4 - 10.4 mg/dL 9.4 8.7 8.7  Total Protein 6.4 - 8.3 g/dL 7.2 6.5 6.5  Total Bilirubin 0.2 - 1.2 mg/dL 0.3 0.3 <0.22  Alkaline Phos 40 - 150 U/L 58 60 58  AST 5 - 34 U/L 15 41(H) 15  ALT 0 - 55 U/L 21 65(H) 19    Lab Results  Component Value Date   WBC 4.0 12/21/2017   HGB 10.9 (L) 12/21/2017   HCT 33.3 (L) 12/21/2017   MCV 101.7 (H) 12/21/2017   PLT 106 (L) 12/21/2017   NEUTROABS 2.7 12/21/2017    ASSESSMENT & PLAN:  Malignant neoplasm of upper-outer quadrant of right breast in female, estrogen receptor positive ([facility]) Right lumpectomy:  IDC with DCIS, 1.4 cm, grade 3, DCIS focally involving posterior margin, 0/3 lymph nodes negative, ER 90%, PR 0%, HER-2 positive ratio 2.6, Ki-67 40% T1 CN 0 stage IA Tumor board did not recommend surgery for the positive posterior margin issue  Recommendation: 1. Resection of the positive margin 2. adjuvant chemotherapy with TCH followed by Herceptin maintenance for a year  3. Followed by adjuvant radiation 4. Followed by adjuvant antiestrogen therapy with tamoxifen that was started prior to surgery --------------------------------------------------------------------  Current treatment: Cycle 6day 1 of TCH (Taxotere discontinued with cycle 5) Chemo toxicities: 1. Fatigue 2. Hair loss 3. Peripheral neuropathy: Because of severe peripheral neuropathy, I recommended that we discontinue Taxotere.   I will consider treatment with Neurontin if her symptoms continue to get worse. The dose of carboplatin was also reduced  today. Neulasta will be discontinued.  4.  Hypokalemia: Potassium today is 3.3.   Referal to XRT Follow up after radiation Patient made plans to go on a cruise trip the week after she finishes her chemotherapy in February.   I spent 25 minutes talking to the patient of which more than half was spent in counseling and coordination of care.  No orders of the defined types were placed in this encounter.  The patient has a good understanding of the overall plan. she agrees with it. she will call with any problems that may develop before the next visit here.   Harriette Ohara, MD 12/21/17

## 2017-12-23 ENCOUNTER — Encounter: Payer: Self-pay | Admitting: Radiation Oncology

## 2017-12-31 NOTE — Progress Notes (Signed)
Location of Breast Cancer: Right Breast Upper outer quadrant  FUN after surgery and chemotherapy for planning  Histology per Pathology Report: Diagnosis 07/15/2017: 1. Breast, right, needle core biopsy, UOQ - INVASIVE DUCTAL CARCINOMA, SEE COMMENT. 2. Lymph node, needle/core biopsy, right and axilla - BENIGN LYMPH NODE.- NO METASTATIC MALIGNANCY Receptor Status: ER(90%+), PR (0% neg), Her2-neu (+ratio=2.60), Ki-67(40%)  Did patient present with symptoms (if so, please note symptoms) or was this found on screening mammography?: patient felt a mass near her axilla in March 2018  Past/Anticipated interventions by surgeon, if any:Dr. Tsuei,MD, 07/15/17 breast bx, Surgery scheduled next Wednesday 08/11/17  Diagnosis 08-11-17 Dr. Rodman Key Tseui 1. Breast, lumpectomy, Right with seed - INVASIVE AND IN SITU DUCTAL CARCINOMA, 1.4 CM, MSBR GRADE 3. - DUCTAL CARCINOMA IN SITU FOCALLY INVOLVED POSTERIOR MARGIN. - INVASIVE CARCINOMA FOCALLY LESS THAN 0.1 CM FROM SUPERIOR MARGIN AND FOCALLY 0.1 CM FROM POSTERIOR MARGIN. - FIBROCYSTIC CHANGES. 2. Lymph node, sentinel, biopsy, Right axillary #1 - ONE BENIGN LYMPH NODE (0/1). 3. Lymph node, sentinel, biopsy, Right axillary - ONE BENIGN LYMPH NODE (0/1). 4. Lymph node, sentinel, biopsy, Right axillary #2 - ONE BENIGN LYMPH NODE (0/1).  Receptor Status: ER(90%+), PR (0% neg), Her2-neu (+ratio=2.60), Ki-67(40%)  Past/Anticipated interventions by medical oncology, if any: Chemotherapy  09-07-17 Adjuvant Chemotherapy TCH cycle 6, without Taxotere then maintenance Herceptin x 1 year                                                                                               Followed by adjuvant radiation                                                                                                Followed by adjuvant antiestrogen therapy with tamoxifen that was started prior to surgery                           08-06-17 Genetic testing                                                                                              07-22-17 Saw Dr. Lindi Adie Lymphedema issues, if any: No Rom to right arm good Skin to right breast healed. Follow up appointment past her surgery will see again in six months.    Pain issues, if any:  No  SAFETY ISSUES: No  Prior radiation? No  Pacemaker/ICD? No  Possible current pregnancy? No  Is the patient on methotrexate? No  Menarche 7  G3 P0,       BC  1st pregnancy age 21, 3 ectopic pregnancies,on orla contraceptives 7 years Current Complaints / other details: Married, never any tobacco products,occasional alcohol, no drug use  Wt Readings from Last 3 Encounters:  01/06/18 175 lb 3.2 oz (79.5 kg)  12/21/17 170 lb 6.4 oz (77.3 kg)  11/30/17 174 lb (78.9 kg)  BP 120/76 (BP Location: Right Arm, Patient Position: Sitting, Cuff Size: Normal)   Pulse 84   Temp 98.3 F (36.8 C) (Oral)   Resp 18   Ht '5\' 3"'$  (1.6 m)   Wt 175 lb 3.2 oz (79.5 kg)   LMP 08/23/2017   SpO2 100%   BMI 31.04 kg/m

## 2018-01-06 ENCOUNTER — Ambulatory Visit
Admission: RE | Admit: 2018-01-06 | Discharge: 2018-01-06 | Disposition: A | Discharge status: Home / Self Care | Payer: 59 | Source: Ambulatory Visit | Attending: Radiation Oncology | Admitting: Radiation Oncology

## 2018-01-06 ENCOUNTER — Encounter: Payer: Self-pay | Admitting: Radiation Oncology

## 2018-01-06 ENCOUNTER — Other Ambulatory Visit: Payer: Self-pay

## 2018-01-06 VITALS — BP 120/76 | HR 84 | Temp 98.3°F | Resp 18 | Ht 63.0 in | Wt 175.2 lb

## 2018-01-06 DIAGNOSIS — Z79899 Other long term (current) drug therapy: Secondary | ICD-10-CM | POA: Insufficient documentation

## 2018-01-06 DIAGNOSIS — C50411 Malignant neoplasm of upper-outer quadrant of right female breast: Secondary | ICD-10-CM | POA: Diagnosis not present

## 2018-01-06 DIAGNOSIS — Z8249 Family history of ischemic heart disease and other diseases of the circulatory system: Secondary | ICD-10-CM | POA: Insufficient documentation

## 2018-01-06 DIAGNOSIS — K589 Irritable bowel syndrome without diarrhea: Secondary | ICD-10-CM | POA: Diagnosis not present

## 2018-01-06 DIAGNOSIS — Z825 Family history of asthma and other chronic lower respiratory diseases: Secondary | ICD-10-CM | POA: Diagnosis not present

## 2018-01-06 DIAGNOSIS — Z170 Estrogen receptor positive status [ER+]: Principal | ICD-10-CM

## 2018-01-06 DIAGNOSIS — Z9889 Other specified postprocedural states: Secondary | ICD-10-CM | POA: Diagnosis not present

## 2018-01-06 DIAGNOSIS — Z803 Family history of malignant neoplasm of breast: Secondary | ICD-10-CM | POA: Diagnosis not present

## 2018-01-06 DIAGNOSIS — Z510 Encounter for antineoplastic radiation therapy: Secondary | ICD-10-CM | POA: Insufficient documentation

## 2018-01-06 DIAGNOSIS — R7303 Prediabetes: Secondary | ICD-10-CM | POA: Diagnosis not present

## 2018-01-06 DIAGNOSIS — Z823 Family history of stroke: Secondary | ICD-10-CM | POA: Insufficient documentation

## 2018-01-06 DIAGNOSIS — Z79891 Long term (current) use of opiate analgesic: Secondary | ICD-10-CM | POA: Diagnosis not present

## 2018-01-06 DIAGNOSIS — Z833 Family history of diabetes mellitus: Secondary | ICD-10-CM | POA: Diagnosis not present

## 2018-01-06 NOTE — Progress Notes (Signed)
Radiation Oncology         [phone] ________________________________  Name: Brianna [name]        MRN: [id number]  Date of Service: 01/06/2018 DOB: 17-Aug-1981  NT:[id number], Malka So, MD  Nicholas Lose, MD     REFERRING PHYSICIAN: Nicholas Lose, MD   DIAGNOSIS: The encounter diagnosis was Malignant neoplasm of upper-outer quadrant of right breast in female, estrogen receptor positive ([place]).   HISTORY OF PRESENT ILLNESS: Brianna [name] is a 37 y.o. female originally seen at the request of Dr. Gershon Crane for a new diagnosis of right breast cancer. She noted a palpable mass in the right breast in March 2018. She was having dental work which prevented her from having this evaluated until she saw her primary provider in July 2018. Mammography revealed that there was a 1.4 cm mass in the right breast. Diagnostic ultrasound confirmed the lesion to be 1.1 cm, and there was thickening of the cortex of an axillary lymph node. 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 Her lymph node seen on ultrasound was also biopsied and negative and felt to be concordant. She underwent right lumpectomy and sentinel lymph node biopsy on 08/11/2017.  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  She had a positive margin posteriorly, though her posterior margin was the pectoralis.  Of the 3 sampled nodes, none contained any evidence of cancer, and her tumor was again ER positive PR negative HER-2 positive with an amplification of 2.6, Ki-67 of 40%.  Following surgery, she proceeded with [facility] chemotherapy and began this on 09/07/2017.  Her last infusion was 12/21/2017, and her plan is to proceed with 6 additional months of Herceptin.  She comes today to discuss the role of adjuvant radiotherapy.  PREVIOUS RADIATION THERAPY: No   PAST MEDICAL HISTORY:  Past Medical History:  Diagnosis Date  . Acne   .  History of concussion 08/2016  . IBS (irritable bowel syndrome)    no current med.  . Invasive ductal carcinoma of breast, right ([place]) 07/2017  . Pre-diabetes        PAST SURGICAL HISTORY: Past Surgical History:  Procedure Laterality Date  . BREAST LUMPECTOMY WITH RADIOACTIVE SEED AND SENTINEL LYMPH NODE BIOPSY Right 08/11/2017   Procedure: RIGHT BREAST LUMPECTOMY WITH RADIOACTIVE SEED AND RIGHT SENTINEL LYMPH NODE BIOPSY;  Surgeon: Donnie Mesa, MD;  Location: [facility];  Service: General;  Laterality: Right;  ERAS PATHWAY  . PORTACATH PLACEMENT Left 08/11/2017   Procedure: INSERTION PORT-A-CATH WITH ULTRA SOUND;  Surgeon: Donnie Mesa, MD;  Location: [facility];  Service: General;  Laterality: Left;  . WISDOM TOOTH EXTRACTION       FAMILY HISTORY:  Family History  Problem Relation Age of Onset  . Heart disease Maternal Grandmother        d.53  . Stroke Maternal Grandfather   . Diabetes Maternal Grandfather   . Hyperlipidemia Mother   . Diabetes Maternal Uncle   . Heart attack Other   . Obesity Other   . COPD Other   . Asthma Other   . Breast cancer Other 9       Maternal grandfather's sister     SOCIAL HISTORY:  reports that  has never smoked. she has never used smokeless tobacco. She reports that she drinks alcohol. She reports that she does not use drugs. The patient is married  and lives in [place]. She is a Education officer, museum and works for [employer] She is originally from [place].    ALLERGIES: Patient has no known allergies.   MEDICATIONS:  Current Outpatient Medications  Medication Sig Dispense Refill  . Clindamycin-Benzoyl Per, Refr, gel Apply to face once daily in the morning.    Marland Kitchen dexamethasone (DECADRON) 4 MG tablet Take 1 tablet (4 mg total) by mouth daily. Take 1 tablet daily before chemotherapy and one tablet day after chemotherapy 4 tablet 0  . Famotidine (PEPCID AC PO) Take by mouth daily.    Marland Kitchen  HYDROcodone-acetaminophen (NORCO/VICODIN) 5-325 MG tablet Take 1 tablet by mouth every 6 (six) hours as needed for moderate pain. (Patient not taking: Reported on 10/12/2017) 20 tablet 0  . lidocaine-prilocaine (EMLA) cream Apply to affected area once 30 g 3  . LORazepam (ATIVAN) 0.5 MG tablet Take 1 tablet (0.5 mg total) by mouth at bedtime. As needed for sleep 30 tablet 0  . naproxen sodium (ANAPROX) 220 MG tablet Take 220 mg by mouth as needed.    . ondansetron (ZOFRAN) 8 MG tablet Take 1 tablet (8 mg total) by mouth 2 (two) times daily as needed for refractory nausea / vomiting. Start on day 3 after chemo. 30 tablet 1  . potassium chloride SA (K-DUR,KLOR-CON) 20 MEQ tablet Take 1 tablet (20 mEq total) by mouth daily. 30 tablet 0  . prochlorperazine (COMPAZINE) 10 MG tablet Take 1 tablet (10 mg total) by mouth every 6 (six) hours as needed (Nausea or vomiting). 30 tablet 1  . spironolactone (ALDACTONE) 100 MG tablet Take 100 mg by mouth daily.    . Sulfacetamide Sodium-Sulfur (CLENIA FOAMING [place]) 10-5 % EMUL Apply topically.    . tretinoin (RETIN-A) 0.025 % gel Apply to face nightly     No current facility-administered medications for this encounter.      REVIEW OF SYSTEMS: On review of systems, the patient reports that she is doing well overall. She denies any chest pain, shortness of breath, cough, fevers, chills, night sweats, unintended weight changes. She denies any bowel or bladder disturbances, and denies abdominal pain, nausea or vomiting. She denies any new musculoskeletal or joint aches or pains, new skin lesions or concerns. A complete review of systems is obtained and is otherwise negative.      PHYSICAL EXAM:  Wt Readings from Last 3 Encounters:  12/21/17 170 lb 6.4 oz (77.3 kg)  11/30/17 174 lb (78.9 kg)  11/09/17 174 lb 12.8 oz (79.3 kg)   Temp Readings from Last 3 Encounters:  12/21/17 98.5 F (36.9 C) (Oral)  11/30/17 98 F (36.7 C) (Oral)  11/09/17 98.7 F (37.1  C) (Oral)   BP Readings from Last 3 Encounters:  12/21/17 110/71  11/30/17 111/68  11/09/17 105/69   Pulse Readings from Last 3 Encounters:  12/21/17 96  11/30/17 92  11/09/17 86     In general this is a well appearing African American female in no acute distress. She is alert and oriented x4 and appropriate throughout the examination. HEENT reveals that the patient is normocephalic, atraumatic. EOMs are intact. PERRLA. Skin is intact without any evidence of gross lesions. Cardiopulmonary assessment is negative for acute distress and she exhibits normal effort. Breast exam reveals a well healed axillary incision site. No other visible abnormalities are noted.  ECOG = 0  0 - Asymptomatic (Fully active, able to carry on all predisease activities without restriction)  1 - Symptomatic but completely ambulatory (Restricted in  physically strenuous activity but ambulatory and able to carry out work of a light or sedentary nature. For example, light housework, office work)  2 - Symptomatic, <50% in bed during the day (Ambulatory and capable of all self care but unable to carry out any work activities. Up and about more than 50% of waking hours)  3 - Symptomatic, >50% in bed, but not bedbound (Capable of only limited self-care, confined to bed or chair 50% or more of waking hours)  4 - Bedbound (Completely disabled. Cannot carry on any self-care. Totally confined to bed or chair)  5 - Death   Eustace Pen MM, Creech RH, Tormey DC, et al. [phone]). "Toxicity and response criteria of the [facility]". [place] Oncol. 5 (6): 649-55    LABORATORY DATA:  Lab Results  Component Value Date   WBC 4.0 12/21/2017   HGB 10.9 (L) 12/21/2017   HCT 33.3 (L) 12/21/2017   MCV 101.7 (H) 12/21/2017   PLT 106 (L) 12/21/2017   Lab Results  Component Value Date   NA 140 12/21/2017   K 3.8 12/21/2017   CL 107 12/21/2017   CO2 24 12/21/2017   Lab Results  Component Value Date    ALT 21 12/21/2017   AST 15 12/21/2017   ALKPHOS 58 12/21/2017   BILITOT 0.3 12/21/2017      RADIOGRAPHY: No results found.     IMPRESSION/PLAN: 1. Stage IA, pT1cN0M0, ER positive, HER2 amplified, grade 3 invasive ductal carcinoma of the right breast. She has completed chemotherapy and will continue with monthly herceptin, and is ready for external radiotherapy to the breast followed by antiestrogen therapy. We discussed the risks, benefits, short, and long term effects of radiotherapy, and the patient is interested in proceeding. Dr. Lisbeth Renshaw discusses the delivery and logistics of radiotherapy and would anticipate a course of 6 1/2 weeks of treatment to the breast.  She will simulate on Tuesday next week. Written consent is obtained and placed in the chart, a copy was provided to the patient. 2. Research project for understanding the patient experience.  In our department we are currently looking at a better way to understand patient experience for those undergoing radiotherapy.  She was counseled on the opportunity to participate in this project, after discussing the logistics of this, the patient is interested in this and will be contacted by our staff to coordinate this encounter.   In a visit lasting 25 minutes, greater than 50% of the time was spent face to face discussing radiotherapy utility and the logistics along with her anticipated chemotherapy regimen, and coordinating the patient's care.   The above documentation reflects my direct findings during this shared patient visit. Please see the separate note by Dr. Lisbeth Renshaw on this date for the remainder of the patient's plan of care.    Carola Rhine, PAC

## 2018-01-06 NOTE — Addendum Note (Signed)
Encounter addended by: Malena Edman, RN on: 01/06/2018 11:22 AM  Actions taken: Charge Capture section accepted

## 2018-01-11 ENCOUNTER — Ambulatory Visit: Payer: 59 | Admitting: Radiation Oncology

## 2018-01-11 ENCOUNTER — Inpatient Hospital Stay: Payer: 59 | Attending: Hematology and Oncology

## 2018-01-11 ENCOUNTER — Ambulatory Visit
Admission: RE | Admit: 2018-01-11 | Discharge: 2018-01-11 | Disposition: A | Discharge status: Home / Self Care | Payer: 59 | Source: Ambulatory Visit | Attending: Radiation Oncology | Admitting: Radiation Oncology

## 2018-01-11 ENCOUNTER — Ambulatory Visit: Payer: 59

## 2018-01-11 VITALS — BP 105/63 | HR 77 | Temp 98.2°F | Resp 16

## 2018-01-11 DIAGNOSIS — C50411 Malignant neoplasm of upper-outer quadrant of right female breast: Secondary | ICD-10-CM | POA: Diagnosis present

## 2018-01-11 DIAGNOSIS — Z170 Estrogen receptor positive status [ER+]: Secondary | ICD-10-CM

## 2018-01-11 DIAGNOSIS — Z5112 Encounter for antineoplastic immunotherapy: Secondary | ICD-10-CM | POA: Insufficient documentation

## 2018-01-11 DIAGNOSIS — Z510 Encounter for antineoplastic radiation therapy: Secondary | ICD-10-CM | POA: Diagnosis not present

## 2018-01-11 MED ORDER — SODIUM CHLORIDE 0.9% FLUSH
10.0000 mL | INTRAVENOUS | Status: DC | PRN
  Administered 2018-01-11: 10 mL
  Filled 2018-01-11: qty 10

## 2018-01-11 MED ORDER — ACETAMINOPHEN 325 MG PO TABS
650.0000 mg | ORAL_TABLET | Freq: Once | ORAL | Status: AC
  Administered 2018-01-11: 650 mg via ORAL

## 2018-01-11 MED ORDER — ACETAMINOPHEN 325 MG PO TABS
ORAL_TABLET | ORAL | Status: AC
  Filled 2018-01-11: qty 2

## 2018-01-11 MED ORDER — DIPHENHYDRAMINE HCL 25 MG PO CAPS
ORAL_CAPSULE | ORAL | Status: AC
  Filled 2018-01-11: qty 1

## 2018-01-11 MED ORDER — DIPHENHYDRAMINE HCL 25 MG PO CAPS
ORAL_CAPSULE | ORAL | Status: AC
  Filled 2018-01-11: qty 2

## 2018-01-11 MED ORDER — TRASTUZUMAB CHEMO 150 MG IV SOLR
450.0000 mg | Freq: Once | INTRAVENOUS | Status: AC
  Administered 2018-01-11: 450 mg via INTRAVENOUS
  Filled 2018-01-11: qty 21.43

## 2018-01-11 MED ORDER — SODIUM CHLORIDE 0.9 % IV SOLN
Freq: Once | INTRAVENOUS | Status: AC
  Administered 2018-01-11: 16:00:00 via INTRAVENOUS

## 2018-01-11 MED ORDER — HEPARIN SOD (PORK) LOCK FLUSH 100 UNIT/ML IV SOLN
500.0000 [IU] | Freq: Once | INTRAVENOUS | Status: AC | PRN
  Administered 2018-01-11: 500 [IU]
  Filled 2018-01-11: qty 5

## 2018-01-11 MED ORDER — DIPHENHYDRAMINE HCL 25 MG PO CAPS
50.0000 mg | ORAL_CAPSULE | Freq: Once | ORAL | Status: AC
  Administered 2018-01-11: 50 mg via ORAL

## 2018-01-11 NOTE — Patient Instructions (Signed)
[facility] Discharge Instructions for Patients Receiving Chemotherapy  Today you received the following chemotherapy agents Herceptin  To help prevent nausea and vomiting after your treatment, we encourage you to take your nausea medication as directed   If you develop nausea and vomiting that is not controlled by your nausea medication, call the clinic.   BELOW ARE SYMPTOMS THAT SHOULD BE REPORTED IMMEDIATELY:  *FEVER GREATER THAN 100.5 F  *CHILLS WITH OR WITHOUT FEVER  NAUSEA AND VOMITING THAT IS NOT CONTROLLED WITH YOUR NAUSEA MEDICATION  *UNUSUAL SHORTNESS OF BREATH  *UNUSUAL BRUISING OR BLEEDING  TENDERNESS IN MOUTH AND THROAT WITH OR WITHOUT PRESENCE OF ULCERS  *URINARY PROBLEMS  *BOWEL PROBLEMS  UNUSUAL RASH Items with * indicate a potential emergency and should be followed up as soon as possible.  Feel free to call the clinic should you have any questions or concerns. The clinic phone number is [phone].  Please show the CHEMO ALERT CARD at check-in to the Emergency Department and triage nurse.   

## 2018-01-12 NOTE — Progress Notes (Signed)
  Radiation Oncology         [phone] ________________________________  Name: Brianna [name] MRN: [id number]  Date: 01/11/2018  DOB: Mar 13, 1981  DIAGNOSIS:     ICD-10-CM   1. Malignant neoplasm of upper-outer quadrant of right breast in female, estrogen receptor positive ([place]) C50.411    Z17.0      SIMULATION AND TREATMENT PLANNING NOTE  The patient presented for simulation prior to beginning her course of radiation treatment for her diagnosis of right-sided breast cancer. The patient was placed in a supine position on a breast board. A customized vac-lock bag was constructed and this complex treatment device will be used on a daily basis during her treatment. In this fashion, a CT scan was obtained through the chest area and an isocenter was placed near the chest wall within the breast.  The patient will be planned to receive a course of radiation initially to a dose of 50.4 Gy. This will consist of a whole breast radiotherapy technique. To accomplish this, 2 customized blocks have been designed which will correspond to medial and lateral whole breast tangent fields. This treatment will be accomplished at 1.8 Gy per fraction. A forward planning technique will also be evaluated to determine if this approach improves the plan. It is anticipated that the patient will then receive a 10 Gy boost to the seroma cavity which has been contoured. This will be accomplished at 2 Gy per fraction.   This initial treatment will consist of a 3-D conformal technique. The seroma has been contoured as the primary target structure. Additionally, dose volume histograms of both this target as well as the lungs and heart will also be evaluated. Such an approach is necessary to ensure that the target area is adequately covered while the nearby critical  normal structures are adequately spared.  Plan:  The final anticipated total dose therefore will correspond to 60.4  Gy.    _______________________________   Jodelle Gross, MD, PhD

## 2018-01-12 NOTE — Progress Notes (Signed)
  Radiation Oncology         [phone] ________________________________  Name: Jiali Linney MRN: [id number]  Date: 01/11/2018  DOB: 07-08-1981  Optical Surface Tracking Plan:  Since intensity modulated radiotherapy (IMRT) and 3D conformal radiation treatment methods are predicated on accurate and precise positioning for treatment, intrafraction motion monitoring is medically necessary to ensure accurate and safe treatment delivery.  The ability to quantify intrafraction motion without excessive ionizing radiation dose can only be performed with optical surface tracking. Accordingly, surface imaging offers the opportunity to obtain 3D measurements of patient position throughout IMRT and 3D treatments without excessive radiation exposure.  I am ordering optical surface tracking for this patient's upcoming course of radiotherapy. ________________________________  Kyung Rudd, MD 01/12/2018 4:36 PM    Reference:   Particia Jasper, et al. Surface imaging-based analysis of intrafraction motion for breast radiotherapy patients.Journal of [place], n. 6, nov. 2014. ISSN [id number].   Available at: <http://www.jacmp.org/index.php/jacmp/article/view/4957>.

## 2018-01-13 DIAGNOSIS — C50411 Malignant neoplasm of upper-outer quadrant of right female breast: Secondary | ICD-10-CM | POA: Diagnosis not present

## 2018-01-13 DIAGNOSIS — Z510 Encounter for antineoplastic radiation therapy: Secondary | ICD-10-CM | POA: Diagnosis not present

## 2018-01-18 ENCOUNTER — Ambulatory Visit
Admission: RE | Admit: 2018-01-18 | Discharge: 2018-01-18 | Disposition: A | Discharge status: Home / Self Care | Payer: 59 | Source: Ambulatory Visit | Attending: Radiation Oncology | Admitting: Radiation Oncology

## 2018-01-18 DIAGNOSIS — Z170 Estrogen receptor positive status [ER+]: Secondary | ICD-10-CM | POA: Diagnosis not present

## 2018-01-18 DIAGNOSIS — Z510 Encounter for antineoplastic radiation therapy: Secondary | ICD-10-CM | POA: Diagnosis not present

## 2018-01-18 DIAGNOSIS — C50411 Malignant neoplasm of upper-outer quadrant of right female breast: Secondary | ICD-10-CM | POA: Diagnosis not present

## 2018-01-19 ENCOUNTER — Ambulatory Visit
Admission: RE | Admit: 2018-01-19 | Discharge: 2018-01-19 | Disposition: A | Discharge status: Home / Self Care | Payer: 59 | Source: Ambulatory Visit | Attending: Radiation Oncology | Admitting: Radiation Oncology

## 2018-01-19 DIAGNOSIS — Z510 Encounter for antineoplastic radiation therapy: Secondary | ICD-10-CM | POA: Diagnosis not present

## 2018-01-19 DIAGNOSIS — C50411 Malignant neoplasm of upper-outer quadrant of right female breast: Secondary | ICD-10-CM | POA: Diagnosis not present

## 2018-01-20 ENCOUNTER — Ambulatory Visit
Admission: RE | Admit: 2018-01-20 | Discharge: 2018-01-20 | Disposition: A | Discharge status: Home / Self Care | Payer: 59 | Source: Ambulatory Visit | Attending: Radiation Oncology | Admitting: Radiation Oncology

## 2018-01-20 DIAGNOSIS — C50411 Malignant neoplasm of upper-outer quadrant of right female breast: Secondary | ICD-10-CM | POA: Diagnosis not present

## 2018-01-20 DIAGNOSIS — Z510 Encounter for antineoplastic radiation therapy: Secondary | ICD-10-CM | POA: Diagnosis not present

## 2018-01-21 ENCOUNTER — Ambulatory Visit
Admission: RE | Admit: 2018-01-21 | Discharge: 2018-01-21 | Disposition: A | Discharge status: Home / Self Care | Payer: 59 | Source: Ambulatory Visit | Attending: Radiation Oncology | Admitting: Radiation Oncology

## 2018-01-21 DIAGNOSIS — Z510 Encounter for antineoplastic radiation therapy: Secondary | ICD-10-CM | POA: Diagnosis present

## 2018-01-21 DIAGNOSIS — C50411 Malignant neoplasm of upper-outer quadrant of right female breast: Secondary | ICD-10-CM | POA: Diagnosis not present

## 2018-01-21 DIAGNOSIS — Z170 Estrogen receptor positive status [ER+]: Secondary | ICD-10-CM | POA: Insufficient documentation

## 2018-01-21 MED ORDER — RADIAPLEXRX EX GEL
Freq: Once | CUTANEOUS | Status: AC
  Administered 2018-01-21: 16:00:00 via TOPICAL

## 2018-01-21 MED ORDER — ALRA NON-METALLIC DEODORANT (RAD-ONC)
1.0000 "application " | Freq: Once | TOPICAL | Status: AC
  Administered 2018-01-21: 1 via TOPICAL

## 2018-01-21 NOTE — Progress Notes (Signed)
Pt here for patient teaching.  Pt given Radiation and You booklet, skin care instructions, Alra deodorant and Radiaplex gel.  Reviewed areas of pertinence such as fatigue, hair loss, skin changes, breast tenderness and breast swelling . Pt able to give teach back of to pat skin, use unscented/gentle soap and drink plenty of water,apply Radiaplex bid, avoid applying anything to skin within 4 hours of treatment, avoid wearing an under wire bra and to use an electric razor if they must shave. Pt verbalizes understanding of information given and will contact nursing with any questions or concerns.     Http://rtanswers.org/treatmentinformation/whattoexpect/index      

## 2018-01-24 ENCOUNTER — Ambulatory Visit
Admission: RE | Admit: 2018-01-24 | Discharge: 2018-01-24 | Disposition: A | Discharge status: Home / Self Care | Payer: 59 | Source: Ambulatory Visit | Attending: Radiation Oncology | Admitting: Radiation Oncology

## 2018-01-24 ENCOUNTER — Encounter: Payer: Self-pay | Admitting: Family Medicine

## 2018-01-24 DIAGNOSIS — Z170 Estrogen receptor positive status [ER+]: Secondary | ICD-10-CM | POA: Diagnosis not present

## 2018-01-24 DIAGNOSIS — J069 Acute upper respiratory infection, unspecified: Secondary | ICD-10-CM | POA: Diagnosis not present

## 2018-01-24 DIAGNOSIS — Z5112 Encounter for antineoplastic immunotherapy: Secondary | ICD-10-CM | POA: Diagnosis not present

## 2018-01-24 DIAGNOSIS — C50411 Malignant neoplasm of upper-outer quadrant of right female breast: Secondary | ICD-10-CM | POA: Diagnosis not present

## 2018-01-24 DIAGNOSIS — C50911 Malignant neoplasm of unspecified site of right female breast: Secondary | ICD-10-CM | POA: Diagnosis not present

## 2018-01-24 LAB — HM MAMMOGRAPHY

## 2018-01-25 ENCOUNTER — Ambulatory Visit
Admission: RE | Admit: 2018-01-25 | Discharge: 2018-01-25 | Disposition: A | Discharge status: Home / Self Care | Payer: 59 | Source: Ambulatory Visit | Attending: Radiation Oncology | Admitting: Radiation Oncology

## 2018-01-25 DIAGNOSIS — C50411 Malignant neoplasm of upper-outer quadrant of right female breast: Secondary | ICD-10-CM | POA: Diagnosis not present

## 2018-01-26 ENCOUNTER — Ambulatory Visit
Admission: RE | Admit: 2018-01-26 | Discharge: 2018-01-26 | Disposition: A | Discharge status: Home / Self Care | Payer: 59 | Source: Ambulatory Visit | Attending: Radiation Oncology | Admitting: Radiation Oncology

## 2018-01-26 DIAGNOSIS — C50411 Malignant neoplasm of upper-outer quadrant of right female breast: Secondary | ICD-10-CM | POA: Diagnosis not present

## 2018-01-27 ENCOUNTER — Ambulatory Visit
Admission: RE | Admit: 2018-01-27 | Discharge: 2018-01-27 | Disposition: A | Discharge status: Home / Self Care | Payer: 59 | Source: Ambulatory Visit | Attending: Radiation Oncology | Admitting: Radiation Oncology

## 2018-01-27 DIAGNOSIS — C50411 Malignant neoplasm of upper-outer quadrant of right female breast: Secondary | ICD-10-CM | POA: Diagnosis not present

## 2018-01-28 ENCOUNTER — Ambulatory Visit
Admission: RE | Admit: 2018-01-28 | Discharge: 2018-01-28 | Disposition: A | Discharge status: Home / Self Care | Payer: 59 | Source: Ambulatory Visit | Attending: Radiation Oncology | Admitting: Radiation Oncology

## 2018-01-28 DIAGNOSIS — C50411 Malignant neoplasm of upper-outer quadrant of right female breast: Secondary | ICD-10-CM | POA: Diagnosis not present

## 2018-01-31 ENCOUNTER — Ambulatory Visit
Admission: RE | Admit: 2018-01-31 | Discharge: 2018-01-31 | Disposition: A | Discharge status: Home / Self Care | Payer: 59 | Source: Ambulatory Visit | Attending: Radiation Oncology | Admitting: Radiation Oncology

## 2018-01-31 DIAGNOSIS — C50411 Malignant neoplasm of upper-outer quadrant of right female breast: Secondary | ICD-10-CM | POA: Diagnosis not present

## 2018-02-01 ENCOUNTER — Ambulatory Visit
Admission: RE | Admit: 2018-02-01 | Discharge: 2018-02-01 | Disposition: A | Discharge status: Home / Self Care | Payer: 59 | Source: Ambulatory Visit | Attending: Radiation Oncology | Admitting: Radiation Oncology

## 2018-02-01 ENCOUNTER — Inpatient Hospital Stay: Payer: 59

## 2018-02-01 ENCOUNTER — Inpatient Hospital Stay: Payer: 59 | Attending: Hematology and Oncology | Admitting: Hematology and Oncology

## 2018-02-01 ENCOUNTER — Telehealth: Payer: Self-pay | Admitting: Hematology and Oncology

## 2018-02-01 ENCOUNTER — Other Ambulatory Visit: Payer: Self-pay

## 2018-02-01 DIAGNOSIS — C50411 Malignant neoplasm of upper-outer quadrant of right female breast: Secondary | ICD-10-CM | POA: Diagnosis not present

## 2018-02-01 DIAGNOSIS — Z170 Estrogen receptor positive status [ER+]: Secondary | ICD-10-CM | POA: Diagnosis not present

## 2018-02-01 DIAGNOSIS — Z79899 Other long term (current) drug therapy: Principal | ICD-10-CM

## 2018-02-01 DIAGNOSIS — J358 Other chronic diseases of tonsils and adenoids: Secondary | ICD-10-CM | POA: Insufficient documentation

## 2018-02-01 DIAGNOSIS — Z5181 Encounter for therapeutic drug level monitoring: Secondary | ICD-10-CM

## 2018-02-01 DIAGNOSIS — Z5112 Encounter for antineoplastic immunotherapy: Secondary | ICD-10-CM | POA: Diagnosis present

## 2018-02-01 DIAGNOSIS — J069 Acute upper respiratory infection, unspecified: Secondary | ICD-10-CM | POA: Insufficient documentation

## 2018-02-01 DIAGNOSIS — Z95828 Presence of other vascular implants and grafts: Secondary | ICD-10-CM

## 2018-02-01 LAB — COMPREHENSIVE METABOLIC PANEL
ALBUMIN: 3.7 g/dL (ref 3.5–5.0)
ALT: 11 U/L (ref 0–55)
ANION GAP: 7 (ref 3–11)
AST: 13 U/L (ref 5–34)
Alkaline Phosphatase: 59 U/L (ref 40–150)
BUN: 12 mg/dL (ref 7–26)
CHLORIDE: 106 mmol/L (ref 98–109)
CO2: 27 mmol/L (ref 22–29)
Calcium: 9.7 mg/dL (ref 8.4–10.4)
Creatinine, Ser: 0.94 mg/dL (ref 0.60–1.10)
GFR calc Af Amer: >60 mL/min (ref >60)
GFR calc non Af Amer: >60 mL/min (ref >60)
Glucose, Bld: 110 mg/dL (ref 70–140)
POTASSIUM: 3.8 mmol/L (ref 3.5–5.1)
SODIUM: 140 mmol/L (ref 136–145)
TOTAL PROTEIN: 7.3 g/dL (ref 6.4–8.3)

## 2018-02-01 LAB — CBC WITH DIFFERENTIAL/PLATELET
BASOS ABS: 0 10*3/uL (ref 0.0–0.1)
Basophils Relative: 0 %
Eosinophils Absolute: 0 10*3/uL (ref 0.0–0.5)
Eosinophils Relative: 1 %
HEMATOCRIT: 32.8 % — AB (ref 34.8–46.6)
Hemoglobin: 11 g/dL — ABNORMAL LOW (ref 11.6–15.9)
LYMPHS ABS: 2.2 10*3/uL (ref 0.9–3.3)
LYMPHS PCT: 62 %
MCH: 34.1 pg — AB (ref 25.1–34.0)
MCHC: 33.5 g/dL (ref 31.5–36.0)
MCV: 101.5 fL — AB (ref 79.5–101.0)
MONO ABS: 0.3 10*3/uL (ref 0.1–0.9)
Monocytes Relative: 9 %
NEUTROS ABS: 1 10*3/uL — AB (ref 1.5–6.5)
Neutrophils Relative %: 28 %
Platelets: 276 10*3/uL (ref 145–400)
RBC: 3.23 MIL/uL — AB (ref 3.70–5.45)
RDW: 14.8 % — AB (ref 11.2–14.5)
WBC: 3.5 10*3/uL — ABNORMAL LOW (ref 3.9–10.3)

## 2018-02-01 MED ORDER — ACETAMINOPHEN 325 MG PO TABS
650.0000 mg | ORAL_TABLET | Freq: Once | ORAL | Status: AC
  Administered 2018-02-01: 650 mg via ORAL

## 2018-02-01 MED ORDER — ACETAMINOPHEN 325 MG PO TABS
ORAL_TABLET | ORAL | Status: AC
  Filled 2018-02-01: qty 2

## 2018-02-01 MED ORDER — SODIUM CHLORIDE 0.9% FLUSH
10.0000 mL | INTRAVENOUS | Status: DC | PRN
  Administered 2018-02-01: 10 mL
  Filled 2018-02-01: qty 10

## 2018-02-01 MED ORDER — SODIUM CHLORIDE 0.9 % IV SOLN
450.0000 mg | Freq: Once | INTRAVENOUS | Status: AC
  Administered 2018-02-01: 450 mg via INTRAVENOUS
  Filled 2018-02-01: qty 21.43

## 2018-02-01 MED ORDER — SODIUM CHLORIDE 0.9% FLUSH
10.0000 mL | INTRAVENOUS | Status: DC | PRN
  Administered 2018-02-01: 10 mL via INTRAVENOUS
  Filled 2018-02-01: qty 10

## 2018-02-01 MED ORDER — HEPARIN SOD (PORK) LOCK FLUSH 100 UNIT/ML IV SOLN
500.0000 [IU] | Freq: Once | INTRAVENOUS | Status: AC | PRN
  Administered 2018-02-01: 500 [IU]
  Filled 2018-02-01: qty 5

## 2018-02-01 MED ORDER — DIPHENHYDRAMINE HCL 25 MG PO CAPS
50.0000 mg | ORAL_CAPSULE | Freq: Once | ORAL | Status: AC
  Administered 2018-02-01: 50 mg via ORAL

## 2018-02-01 MED ORDER — SODIUM CHLORIDE 0.9 % IV SOLN
Freq: Once | INTRAVENOUS | Status: AC
  Administered 2018-02-01: 16:00:00 via INTRAVENOUS

## 2018-02-01 MED ORDER — DIPHENHYDRAMINE HCL 25 MG PO CAPS
ORAL_CAPSULE | ORAL | Status: AC
  Filled 2018-02-01: qty 2

## 2018-02-01 MED ORDER — TRASTUZUMAB CHEMO 150 MG IV SOLR: 6.0000 mg/kg | Freq: Once | INTRAVENOUS | Status: DC

## 2018-02-01 NOTE — Telephone Encounter (Signed)
Gave avs and calendar ° °

## 2018-02-01 NOTE — Patient Instructions (Signed)
[facility] Discharge Instructions for Patients Receiving Chemotherapy  Today you received the following chemotherapy agents Herceptin  To help prevent nausea and vomiting after your treatment, we encourage you to take your nausea medication as directed   If you develop nausea and vomiting that is not controlled by your nausea medication, call the clinic.   BELOW ARE SYMPTOMS THAT SHOULD BE REPORTED IMMEDIATELY:  *FEVER GREATER THAN 100.5 F  *CHILLS WITH OR WITHOUT FEVER  NAUSEA AND VOMITING THAT IS NOT CONTROLLED WITH YOUR NAUSEA MEDICATION  *UNUSUAL SHORTNESS OF BREATH  *UNUSUAL BRUISING OR BLEEDING  TENDERNESS IN MOUTH AND THROAT WITH OR WITHOUT PRESENCE OF ULCERS  *URINARY PROBLEMS  *BOWEL PROBLEMS  UNUSUAL RASH Items with * indicate a potential emergency and should be followed up as soon as possible.  Feel free to call the clinic should you have any questions or concerns. The clinic phone number is [phone].  Please show the CHEMO ALERT CARD at check-in to the Emergency Department and triage nurse.   

## 2018-02-01 NOTE — Progress Notes (Signed)
Per May (RN for Dr. Lindi Adie), okay to treat pt with echo from 10/28/17.  Pt is scheduled for repeat echo 02/03/18

## 2018-02-01 NOTE — Assessment & Plan Note (Signed)
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 Adjuvant chemotherapy with [place] followed by Herceptin maintenance for a year started 09/07/2018-12/21/2017  3. Adjuvant radiation started 01/19/2018 4. Followed by adjuvant antiestrogen therapy with tamoxifen that was started prior to surgery -------------------------------------------------------------------- Treatment plan: Continue maintenance Herceptin Herceptin toxicities: None Patient went on a cruise trip  Return to clinic every 3 weeks for Herceptin and every 6 weeks for follow-up with me with labs.

## 2018-02-01 NOTE — Progress Notes (Signed)
Patient Care Team: Brianna [name], [name], MD as PCP - General (Family Medicine)  DIAGNOSIS:  Encounter Diagnosis  Name Primary?  . Malignant neoplasm of upper-outer quadrant of right breast in female, estrogen receptor positive ([place])     SUMMARY OF ONCOLOGIC HISTORY:   Malignant neoplasm of upper-outer quadrant of right breast in female, estrogen receptor positive ([place])   07/15/2017 Initial Diagnosis    Patient palpated a week mass in the right upper outer quadrant breast near the axilla in March 2018 mammogram revealed irregular spiculated mass 1.4 cm, by ultrasound measured 1.1 cm with a suspicious right axillary lymph node; biopsy revealed IDC grade 3, lymph node benign, ER 90%, PR 0%, Ki-67 40%, HER-2 positive ratio 2.6, T1c N0 stage IA, AJCC 8 clinical stage      08/06/2017 Genetic Testing    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  The Hereditary Gene Panel offered by [facility] includes sequencing and/or deletion duplication testing of the following 46 genes: APC, ATM, AXIN2, BARD1, BMPR1A, BRCA1, BRCA2, BRIP1, CDH1, CDKN2A (p14ARF), CDKN2A (p16INK4a), CHEK2, CTNNA1, DICER1, EPCAM (Deletion/duplication testing only), GREM1 (promoter region deletion/duplication testing only), KIT, MEN1, MLH1, MSH2, MSH3, MSH6, MUTYH, NBN, NF1, NHTL1, PALB2, PDGFRA, PMS2, POLD1, POLE, PTEN, RAD50, RAD51C, RAD51D, SDHB, SDHC, SDHD, SMAD4, SMARCA4. STK11, TP53, TSC1, TSC2, and VHL.  The following genes were evaluated for sequence changes only: SDHA and HOXB13 c.251G>A variant only.  The report date is August 06, 2017.      08/11/2017 Surgery    Right lumpectomy: IDC with DCIS, 1.4 cm, grade 3, DCIS focally involving posterior margin, 0/3 lymph nodes negative, ER 90%, PR 0%, HER-2 positive ratio 2.6, Ki-67 40% T1 CN 0 stage IA      09/07/2017 - 12/21/2017 Adjuvant Chemotherapy    TCH x 6, then maintenance Herceptin x 1 year (Taxotere not given 4 cycles 5 and 6)      01/19/2018 -  Radiation Therapy    Adjuvant radiation therapy       CHIEF COMPLIANT: Follow-up on Herceptin  INTERVAL HISTORY: Brianna [name] is a 37 year old with above-mentioned history of right breast cancer treated with lumpectomy and adjuvant chemotherapy and is currently on adjuvant radiation.  She is continuing with maintenance Herceptin.  She is tolerating Herceptin extremely well.  She does not have any side effects to radiation either.  However her neutrophil count today is at 1000.  Most likely this is due to radiation therapy since Herceptin does not cause neutropenia.  REVIEW OF SYSTEMS:   Constitutional: Denies fevers, chills or abnormal weight loss Eyes: Denies blurriness of vision Ears, nose, mouth, throat, and face: Denies mucositis or sore throat Respiratory: Denies cough, dyspnea or wheezes Cardiovascular: Denies palpitation, chest discomfort Gastrointestinal:  Denies nausea, heartburn or change in bowel habits Skin: Denies abnormal skin rashes Lymphatics: Denies new lymphadenopathy or easy bruising Neurological:Denies numbness, tingling or new weaknesses Behavioral/Psych: Mood is stable, no new changes  Extremities: No lower extremity edema Breast:  denies any pain or lumps or nodules in either breasts All other systems were reviewed with the patient and are negative.  I have reviewed the past medical history, past surgical history, social history and family history with the patient and they are unchanged from previous note.  ALLERGIES:  has No Known Allergies.  MEDICATIONS:  Current Outpatient Medications  Medication Sig Dispense Refill  . Clindamycin-Benzoyl Per, Refr, gel Apply to face once daily in the morning.    . Famotidine (PEPCID AC  PO) Take by mouth daily.    Marland Kitchen lidocaine-prilocaine (EMLA) cream Apply to affected area once 30 [name] 3  . naproxen sodium (ANAPROX) 220 MG tablet Take 220 mg by mouth as needed.    Marland Kitchen spironolactone (ALDACTONE) 100 MG tablet Take  100 mg by mouth daily.    . Sulfacetamide Sodium-Sulfur (CLENIA FOAMING [place]) 10-5 % EMUL Apply topically.    . tretinoin (RETIN-A) 0.025 % gel Apply to face nightly     No current facility-administered medications for this visit.     PHYSICAL EXAMINATION: ECOG PERFORMANCE STATUS: 1 - Symptomatic but completely ambulatory  Vitals:   02/01/18 1429  BP: 109/68  Pulse: 75  Resp: 18  Temp: 98.6 F (37 C)  SpO2: 100%   Filed Weights   02/01/18 1429  Weight: 170 lb 1.6 oz (77.2 kg)    GENERAL:alert, no distress and comfortable SKIN: skin color, texture, turgor are normal, no rashes or significant lesions EYES: normal, Conjunctiva are pink and non-injected, sclera clear OROPHARYNX:no exudate, no erythema and lips, buccal mucosa, and tongue normal  NECK: supple, thyroid normal size, non-tender, without nodularity LYMPH:  no palpable lymphadenopathy in the cervical, axillary or inguinal LUNGS: clear to auscultation and percussion with normal breathing effort HEART: regular rate & rhythm and no murmurs and no lower extremity edema ABDOMEN:abdomen soft, non-tender and normal bowel sounds MUSCULOSKELETAL:no cyanosis of digits and no clubbing  NEURO: alert & oriented x 3 with fluent speech, no focal motor/sensory deficits EXTREMITIES: No lower extremity edema  LABORATORY DATA:  I have reviewed the data as listed CMP Latest Ref Rng & Units 12/21/2017 11/30/2017 11/09/2017  Glucose 70 - 140 mg/dL 164(H) 116 127  BUN 7 - 26 mg/dL 11 12 14.0  Creatinine 0.60 - 1.10 mg/dL 0.89 0.92 1.0  Sodium 136 - 145 mmol/L 140 138 138  Potassium 3.3 - 4.7 mmol/L 3.8 3.3 3.1(L)  Chloride 98 - 109 mmol/L 107 106 -  CO2 22 - 29 mmol/L '24 25 23  '$ Calcium 8.4 - 10.4 mg/dL 9.4 8.7 8.7  Total Protein 6.4 - 8.3 [name]/dL 7.2 6.5 6.5  Total Bilirubin 0.2 - 1.2 mg/dL 0.3 0.3 <0.22  Alkaline Phos 40 - 150 U/L 58 60 58  AST 5 - 34 U/L 15 41(H) 15  ALT 0 - 55 U/L 21 65(H) 19    Lab Results  Component Value Date     WBC 3.5 (L) 02/01/2018   HGB 11.0 (L) 02/01/2018   HCT 32.8 (L) 02/01/2018   MCV 101.5 (H) 02/01/2018   PLT 276 02/01/2018   NEUTROABS 1.0 (L) 02/01/2018    ASSESSMENT & PLAN:  Malignant neoplasm of upper-outer quadrant of right breast in female, estrogen receptor positive ([facility]) Right lumpectomy: IDC with DCIS, 1.4 cm, grade 3, DCIS focally involving posterior margin, 0/3 lymph nodes negative, ER 90%, PR 0%, HER-2 positive ratio 2.6, Ki-67 40% T1 CN 0 stage IA Tumor board did not recommend surgery for the positive posterior margin issue  Recommendation: 1. Adjuvant chemotherapy with Ider followed by Herceptin maintenance for a year started 09/07/2018-12/21/2017  3. Adjuvant radiation started 01/19/2018 4. Followed by adjuvant antiestrogen therapy with tamoxifen that was started prior to surgery -------------------------------------------------------------------- Treatment plan: Continue maintenance Herceptin Herceptin toxicities: None Patient went on a cruise trip and had a great time in the [place].  Neutropenia: Probably due to radiation therapy ANC 1000.  I discussed with her to discuss the neutrophil count with her radiation oncologist. Return to clinic every 3 weeks  for Herceptin and every 6 weeks for follow-up with me with labs.  I spent 25 minutes talking to the patient of which more than half was spent in counseling and coordination of care.  No orders of the defined types were placed in this encounter.  The patient has a good understanding of the overall plan. she agrees with it. she will call with any problems that may develop before the next visit here.   Harriette Ohara, MD 02/01/18

## 2018-02-02 ENCOUNTER — Ambulatory Visit
Admission: RE | Admit: 2018-02-02 | Discharge: 2018-02-02 | Disposition: A | Discharge status: Home / Self Care | Payer: 59 | Source: Ambulatory Visit | Attending: Radiation Oncology | Admitting: Radiation Oncology

## 2018-02-02 DIAGNOSIS — C50411 Malignant neoplasm of upper-outer quadrant of right female breast: Secondary | ICD-10-CM | POA: Diagnosis not present

## 2018-02-03 ENCOUNTER — Other Ambulatory Visit (HOSPITAL_COMMUNITY): Payer: 59

## 2018-02-03 ENCOUNTER — Ambulatory Visit
Admission: RE | Admit: 2018-02-03 | Discharge: 2018-02-03 | Disposition: A | Discharge status: Home / Self Care | Payer: 59 | Source: Ambulatory Visit | Attending: Radiation Oncology | Admitting: Radiation Oncology

## 2018-02-03 DIAGNOSIS — C50411 Malignant neoplasm of upper-outer quadrant of right female breast: Secondary | ICD-10-CM | POA: Diagnosis not present

## 2018-02-04 ENCOUNTER — Ambulatory Visit (HOSPITAL_COMMUNITY)
Admission: RE | Admit: 2018-02-04 | Discharge: 2018-02-04 | Disposition: A | Discharge status: Home / Self Care | Payer: 59 | Source: Ambulatory Visit | Attending: Hematology and Oncology | Admitting: Hematology and Oncology

## 2018-02-04 ENCOUNTER — Ambulatory Visit
Admission: RE | Admit: 2018-02-04 | Discharge: 2018-02-04 | Disposition: A | Discharge status: Home / Self Care | Payer: 59 | Source: Ambulatory Visit | Attending: Radiation Oncology | Admitting: Radiation Oncology

## 2018-02-04 DIAGNOSIS — I071 Rheumatic tricuspid insufficiency: Secondary | ICD-10-CM | POA: Diagnosis not present

## 2018-02-04 DIAGNOSIS — Z5181 Encounter for therapeutic drug level monitoring: Secondary | ICD-10-CM | POA: Diagnosis not present

## 2018-02-04 DIAGNOSIS — Z853 Personal history of malignant neoplasm of breast: Secondary | ICD-10-CM | POA: Diagnosis not present

## 2018-02-04 DIAGNOSIS — E119 Type 2 diabetes mellitus without complications: Secondary | ICD-10-CM | POA: Insufficient documentation

## 2018-02-04 DIAGNOSIS — C50411 Malignant neoplasm of upper-outer quadrant of right female breast: Secondary | ICD-10-CM | POA: Insufficient documentation

## 2018-02-04 DIAGNOSIS — Z79899 Other long term (current) drug therapy: Secondary | ICD-10-CM | POA: Diagnosis not present

## 2018-02-04 DIAGNOSIS — Z170 Estrogen receptor positive status [ER+]: Secondary | ICD-10-CM | POA: Diagnosis not present

## 2018-02-04 NOTE — Progress Notes (Signed)
  Echocardiogram 2D Echocardiogram has been performed.  Truxton Stupka T Nathalia Wismer 02/04/2018, 2:10 PM

## 2018-02-07 ENCOUNTER — Ambulatory Visit
Admission: RE | Admit: 2018-02-07 | Discharge: 2018-02-07 | Disposition: A | Discharge status: Home / Self Care | Payer: 59 | Source: Ambulatory Visit | Attending: Radiation Oncology | Admitting: Radiation Oncology

## 2018-02-07 DIAGNOSIS — C50411 Malignant neoplasm of upper-outer quadrant of right female breast: Secondary | ICD-10-CM | POA: Diagnosis not present

## 2018-02-08 ENCOUNTER — Ambulatory Visit
Admission: RE | Admit: 2018-02-08 | Discharge: 2018-02-08 | Disposition: A | Discharge status: Home / Self Care | Payer: 59 | Source: Ambulatory Visit | Attending: Radiation Oncology | Admitting: Radiation Oncology

## 2018-02-08 DIAGNOSIS — C50411 Malignant neoplasm of upper-outer quadrant of right female breast: Secondary | ICD-10-CM | POA: Diagnosis not present

## 2018-02-09 ENCOUNTER — Ambulatory Visit
Admission: RE | Admit: 2018-02-09 | Discharge: 2018-02-09 | Disposition: A | Discharge status: Home / Self Care | Payer: 59 | Source: Ambulatory Visit | Attending: Radiation Oncology | Admitting: Radiation Oncology

## 2018-02-09 DIAGNOSIS — C50411 Malignant neoplasm of upper-outer quadrant of right female breast: Secondary | ICD-10-CM | POA: Diagnosis not present

## 2018-02-10 ENCOUNTER — Ambulatory Visit
Admission: RE | Admit: 2018-02-10 | Discharge: 2018-02-10 | Disposition: A | Discharge status: Home / Self Care | Payer: 59 | Source: Ambulatory Visit | Attending: Radiation Oncology | Admitting: Radiation Oncology

## 2018-02-10 DIAGNOSIS — C50411 Malignant neoplasm of upper-outer quadrant of right female breast: Secondary | ICD-10-CM | POA: Diagnosis not present

## 2018-02-11 ENCOUNTER — Ambulatory Visit
Admission: RE | Admit: 2018-02-11 | Discharge: 2018-02-11 | Disposition: A | Discharge status: Home / Self Care | Payer: 59 | Source: Ambulatory Visit | Attending: Radiation Oncology | Admitting: Radiation Oncology

## 2018-02-11 DIAGNOSIS — C50411 Malignant neoplasm of upper-outer quadrant of right female breast: Secondary | ICD-10-CM | POA: Diagnosis not present

## 2018-02-14 ENCOUNTER — Ambulatory Visit
Admission: RE | Admit: 2018-02-14 | Discharge: 2018-02-14 | Disposition: A | Discharge status: Home / Self Care | Payer: 59 | Source: Ambulatory Visit | Attending: Radiation Oncology | Admitting: Radiation Oncology

## 2018-02-14 DIAGNOSIS — C50411 Malignant neoplasm of upper-outer quadrant of right female breast: Secondary | ICD-10-CM | POA: Diagnosis not present

## 2018-02-15 ENCOUNTER — Ambulatory Visit
Admission: RE | Admit: 2018-02-15 | Discharge: 2018-02-15 | Disposition: A | Discharge status: Home / Self Care | Payer: 59 | Source: Ambulatory Visit | Attending: Radiation Oncology | Admitting: Radiation Oncology

## 2018-02-15 DIAGNOSIS — J358 Other chronic diseases of tonsils and adenoids: Secondary | ICD-10-CM | POA: Diagnosis not present

## 2018-02-15 DIAGNOSIS — Z170 Estrogen receptor positive status [ER+]: Secondary | ICD-10-CM | POA: Diagnosis not present

## 2018-02-15 DIAGNOSIS — Z5112 Encounter for antineoplastic immunotherapy: Secondary | ICD-10-CM | POA: Diagnosis not present

## 2018-02-15 DIAGNOSIS — C50411 Malignant neoplasm of upper-outer quadrant of right female breast: Secondary | ICD-10-CM | POA: Diagnosis not present

## 2018-02-15 DIAGNOSIS — J069 Acute upper respiratory infection, unspecified: Secondary | ICD-10-CM | POA: Diagnosis not present

## 2018-02-16 ENCOUNTER — Ambulatory Visit
Admission: RE | Admit: 2018-02-16 | Discharge: 2018-02-16 | Disposition: A | Discharge status: Home / Self Care | Payer: 59 | Source: Ambulatory Visit | Attending: Radiation Oncology | Admitting: Radiation Oncology

## 2018-02-16 ENCOUNTER — Ambulatory Visit (HOSPITAL_BASED_OUTPATIENT_CLINIC_OR_DEPARTMENT_OTHER): Payer: 59 | Admitting: Medical

## 2018-02-16 ENCOUNTER — Telehealth: Payer: Self-pay | Admitting: *Deleted

## 2018-02-16 DIAGNOSIS — Z170 Estrogen receptor positive status [ER+]: Secondary | ICD-10-CM

## 2018-02-16 DIAGNOSIS — C50411 Malignant neoplasm of upper-outer quadrant of right female breast: Secondary | ICD-10-CM | POA: Diagnosis not present

## 2018-02-16 NOTE — Telephone Encounter (Signed)
At this time noted patient scheduled for 3:00 pm [facility] visit.  Called to F/U with patient not arrived for today's scheduled visit after radiation. "I still have sore throat and headache.  I was not aware or notified of an appointment after radiation today.  Able to come in tomorrow at 1:30 pm before radiation."  Scheduling message sent to reschedule this appointment for tomorrow at 1:30 pm.  Time requested by patient.

## 2018-02-16 NOTE — Telephone Encounter (Signed)
"  Through the night and this morning experiencing sore throat.  Noticed a white glob stuck in my tonsils.  Area also looks red, veins or just irritated.  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  Should I see my PCP or come there?  No fever or trouble swallowing.  I'm going to buy a new cell phone.  Scheduled to come in for Radiation at 2:30 today."

## 2018-02-16 NOTE — Progress Notes (Signed)
No show

## 2018-02-16 NOTE — Telephone Encounter (Signed)
I am happy to see this patient. I will make sure that they are added to my schedule. We can do a rapid strep. [name], can you call the patient and tell them to come in this afternoon. Brianna [name]

## 2018-02-16 NOTE — Telephone Encounter (Signed)
Lucianne Lei, can you see this patient?  Can we do rapid strep tests here?

## 2018-02-17 ENCOUNTER — Telehealth: Payer: Self-pay | Admitting: Hematology and Oncology

## 2018-02-17 ENCOUNTER — Ambulatory Visit
Admission: RE | Admit: 2018-02-17 | Discharge: 2018-02-17 | Disposition: A | Discharge status: Home / Self Care | Payer: 59 | Source: Ambulatory Visit | Attending: Radiation Oncology | Admitting: Radiation Oncology

## 2018-02-17 ENCOUNTER — Inpatient Hospital Stay (HOSPITAL_BASED_OUTPATIENT_CLINIC_OR_DEPARTMENT_OTHER): Payer: 59 | Admitting: Medical

## 2018-02-17 VITALS — BP 116/86 | HR 83 | Temp 98.2°F | Resp 17 | Ht 63.0 in | Wt 168.5 lb

## 2018-02-17 DIAGNOSIS — Z170 Estrogen receptor positive status [ER+]: Secondary | ICD-10-CM | POA: Diagnosis not present

## 2018-02-17 DIAGNOSIS — Z5112 Encounter for antineoplastic immunotherapy: Secondary | ICD-10-CM | POA: Diagnosis not present

## 2018-02-17 DIAGNOSIS — C50411 Malignant neoplasm of upper-outer quadrant of right female breast: Secondary | ICD-10-CM

## 2018-02-17 DIAGNOSIS — J358 Other chronic diseases of tonsils and adenoids: Secondary | ICD-10-CM

## 2018-02-17 DIAGNOSIS — J069 Acute upper respiratory infection, unspecified: Secondary | ICD-10-CM

## 2018-02-17 MED ORDER — AMOXICILLIN-POT CLAVULANATE 875-125 MG PO TABS: 1.0000 | ORAL_TABLET | Freq: Two times a day (BID) | ORAL | 0 refills | Status: DC

## 2018-02-17 MED ORDER — FLUCONAZOLE 150 MG PO TABS: ORAL_TABLET | ORAL | 1 refills | Status: DC

## 2018-02-17 NOTE — Telephone Encounter (Signed)
Rescheduled appt per 3/27 sch msg - left voicemail for patient regarding appts.

## 2018-02-18 ENCOUNTER — Ambulatory Visit
Admission: RE | Admit: 2018-02-18 | Discharge: 2018-02-18 | Disposition: A | Discharge status: Home / Self Care | Payer: 59 | Source: Ambulatory Visit | Attending: Radiation Oncology | Admitting: Radiation Oncology

## 2018-02-18 DIAGNOSIS — C50411 Malignant neoplasm of upper-outer quadrant of right female breast: Secondary | ICD-10-CM | POA: Diagnosis not present

## 2018-02-18 NOTE — Progress Notes (Signed)
[facility] Progress Note   [name] [id number] Jan 18, 1981 37 y.o.  Brianna [name] is managed by Dr. Nicholas Lose  Actively treated with chemotherapy: yes  Current Therapy: Herceptin  Last Treated: 03 / 12 / 2019  Assessment: Plan:    Upper respiratory tract infection, unspecified type - Plan: amoxicillin-clavulanate (AUGMENTIN) 875-125 MG tablet  Tonsil stone   URI: The patient was given a prescription for Augmentin 875-125 mg 1 PO b.i.d. X 7 days. The patient was additionally given a prescription for Diflucan to use as needed should she develop a yeast infection.  Left tonsil stone: [name] was reassured regarding what sounds as though it was a left tonsil stone that she experienced.   Please see After Visit Summary for patient specific instructions.  Future Appointments  Date Time Provider [place]  02/21/2018  2:30 PM [facility] [id number] [facility] None  02/21/2018  2:40 PM Kyung Rudd, MD [facility] None  02/22/2018  2:30 PM [facility] [id number] [facility] None  02/22/2018  2:45 PM [facility] H31 [facility] None  02/23/2018  2:30 PM [facility] [id number] [facility] None  02/24/2018  2:30 PM [facility] [id number] [facility] None  02/25/2018  2:30 PM [facility] [id number] [facility] None  02/28/2018  2:30 PM [facility] [id number] [facility] None  03/01/2018  2:30 PM [facility] [id number] [facility] None  03/02/2018  2:30 PM [facility] [id number] [facility] None  03/03/2018  2:30 PM [facility] [id number] [facility] None  03/04/2018  2:30 PM [facility] [id number] [facility] None  03/15/2018 12:45 PM [facility] LAB 6 [facility] None  03/15/2018  1:00 PM [facility] FLUSH NURSE 2 [facility] None  03/15/2018  1:30 PM Causey, Charlestine Massed, NP [facility] None  03/15/2018  2:30 PM [facility] I26 DNS [facility] None    No orders of the defined types were placed in this encounter.      Subjective:   Patient ID:  Brianna [name] is a 37  y.o. (DOB Jul 16, 1981) female.  Chief Complaint:  Chief Complaint  Patient presents with  . Sore Throat    HPI Brianna [name] is a 37 year old female with a diagnosis of an ER positive malignant neoplasm of the upper outer quadrant of the right breast.  She is managed by Dr. Lindi Adie.  She has most recently been treated with radiation.  She has 2 additional weeks of radiation to complete after which time she will continue on Herceptin.  She presents to the office today with a report of a 2-day history of a sore throat.  She noted a white area in her left tonsil.  She was able to remove a large white mass from her left tonsil.  She also reports having a cough with chills but no fever.  Her cough is nonproductive.  Medications: I have reviewed the patient's current medications.  Allergies: No Known Allergies  Past Medical History:  Diagnosis Date  . Acne   . History of concussion 08/2016  . IBS (irritable bowel syndrome)    no current med.  . Invasive ductal carcinoma of breast, right ([place]) 07/2017  . Pre-diabetes     Past Surgical History:  Procedure Laterality Date  . BREAST LUMPECTOMY WITH RADIOACTIVE SEED AND SENTINEL LYMPH NODE BIOPSY Right 08/11/2017   Procedure: RIGHT BREAST LUMPECTOMY WITH RADIOACTIVE SEED AND RIGHT SENTINEL LYMPH NODE BIOPSY;  Surgeon: Donnie Mesa, MD;  Location: [place];  Service: General;  Laterality: Right;  ERAS PATHWAY  . PORTACATH PLACEMENT Left 08/11/2017   Procedure: INSERTION PORT-A-CATH WITH ULTRA SOUND;  Surgeon: Donnie Mesa, MD;  Location:  [facility];  Service: General;  Laterality: Left;  . WISDOM TOOTH EXTRACTION      Family History  Problem Relation Age of Onset  . Heart disease Maternal Grandmother        d.53  . Stroke Maternal Grandfather   . Diabetes Maternal Grandfather   . Hyperlipidemia Mother   . Diabetes Maternal Uncle   . Heart attack Other   . Obesity Other   . COPD Other   . Asthma Other     . Breast cancer Other 43       Maternal grandfather's sister    Social History   Socioeconomic History  . Marital status: Married    Spouse name: Not on file  . Number of children: Not on file  . Years of education: Not on file  . Highest education level: Not on file  Occupational History  . Not on file  Social Needs  . Financial resource strain: Not on file  . Food insecurity:    Worry: Not on file    Inability: Not on file  . Transportation needs:    Medical: Not on file    Non-medical: Not on file  Tobacco Use  . Smoking status: Never Smoker  . Smokeless tobacco: Never Used  Substance and Sexual Activity  . Alcohol use: Yes    Comment: occasionally  . Drug use: No  . Sexual activity: Never  Lifestyle  . Physical activity:    Days per week: Not on file    Minutes per session: Not on file  . Stress: Not on file  Relationships  . Social connections:    Talks on phone: Not on file    Gets together: Not on file    Attends religious service: Not on file    Active member of club or organization: Not on file    Attends meetings of clubs or organizations: Not on file    Relationship status: Not on file  . Intimate partner violence:    Fear of current or ex partner: Not on file    Emotionally abused: Not on file    Physically abused: Not on file    Forced sexual activity: Not on file  Other Topics Concern  . Not on file  Social History Narrative  . Not on file    Past Medical History, Surgical history, Social history, and Family history were reviewed and updated as appropriate.   Please see review of systems for further details on the patient's review from today.   Review of Systems:  Review of Systems  Constitutional: Positive for chills. Negative for diaphoresis, fatigue and fever.  HENT: Positive for sore throat. Negative for rhinorrhea and voice change.   Respiratory: Positive for cough. Negative for chest tightness, shortness of breath and wheezing.    Cardiovascular: Negative for chest pain, palpitations and leg swelling.  Musculoskeletal: Negative for myalgias.  Neurological: Negative for headaches.    Objective:   Physical Exam:  BP 116/86 (BP Location: Left Arm, Patient Position: Sitting)   Pulse 83   Temp 98.2 F (36.8 C) (Oral)   Resp 17   Ht 5\' 3"  (1.6 m)   Wt 168 lb 8 oz (76.4 kg)   SpO2 100%   BMI 29.85 kg/m  ECOG: 0  Physical Exam  Constitutional: No distress.  HENT:  Head: Normocephalic and atraumatic.  Right Ear: External ear normal.  Left Ear: External ear normal.  Mouth/Throat: Oropharynx is clear and moist. No oropharyngeal exudate.  Eyes: Right eye exhibits no discharge. Left eye exhibits no discharge. No scleral icterus.  Neck: Normal range of motion. Neck supple.  Cardiovascular: Normal rate, regular rhythm and normal heart sounds. Exam reveals no gallop and no friction rub.  No murmur heard. Pulmonary/Chest: Effort normal and breath sounds normal. No respiratory distress. She has no wheezes. She has no rales.  Musculoskeletal: She exhibits no edema.  Lymphadenopathy:    She has no cervical adenopathy.  Neurological: She is alert. Coordination normal.  Skin: Skin is warm and dry. No rash noted. She is not diaphoretic. No erythema.    Lab Review:     Component Value Date/Time   NA 140 02/01/2018 1423   NA 138 11/09/2017 1319   K 3.8 02/01/2018 1423   K 3.1 (L) 11/09/2017 1319   CL 106 02/01/2018 1423   CO2 27 02/01/2018 1423   CO2 23 11/09/2017 1319   GLUCOSE 110 02/01/2018 1423   GLUCOSE 127 11/09/2017 1319   BUN 12 02/01/2018 1423   BUN 14.0 11/09/2017 1319   CREATININE 0.94 02/01/2018 1423   CREATININE 1.0 11/09/2017 1319   CALCIUM 9.7 02/01/2018 1423   CALCIUM 8.7 11/09/2017 1319   PROT 7.3 02/01/2018 1423   PROT 6.5 11/09/2017 1319   ALBUMIN 3.7 02/01/2018 1423   ALBUMIN 3.5 11/09/2017 1319   AST 13 02/01/2018 1423   AST 15 11/09/2017 1319   ALT 11 02/01/2018 1423   ALT 19  11/09/2017 1319   ALKPHOS 59 02/01/2018 1423   ALKPHOS 58 11/09/2017 1319   BILITOT <0.2 (L) 02/01/2018 1423   BILITOT <0.22 11/09/2017 1319   GFRNONAA >60 02/01/2018 1423   GFRAA >60 02/01/2018 1423       Component Value Date/Time   WBC 3.5 (L) 02/01/2018 1423   RBC 3.23 (L) 02/01/2018 1423   HGB 11.0 (L) 02/01/2018 1423   HGB 10.4 (L) 11/09/2017 1319   HCT 32.8 (L) 02/01/2018 1423   HCT 32.1 (L) 11/09/2017 1319   PLT 276 02/01/2018 1423   PLT 153 11/09/2017 1319   MCV 101.5 (H) 02/01/2018 1423   MCV 96.7 11/09/2017 1319   MCH 34.1 (H) 02/01/2018 1423   MCHC 33.5 02/01/2018 1423   RDW 14.8 (H) 02/01/2018 1423   RDW 18.3 (H) 11/09/2017 1319   LYMPHSABS 2.2 02/01/2018 1423   LYMPHSABS 3.8 (H) 11/09/2017 1319   MONOABS 0.3 02/01/2018 1423   MONOABS 0.6 11/09/2017 1319   EOSABS 0.0 02/01/2018 1423   EOSABS 0.1 11/09/2017 1319   BASOSABS 0.0 02/01/2018 1423   BASOSABS 0.0 11/09/2017 1319   -------------------------------  Imaging from last 24 hours (if applicable):  Radiology interpretation: No results found.

## 2018-02-21 ENCOUNTER — Ambulatory Visit: Payer: 59 | Admitting: Radiation Oncology

## 2018-02-21 ENCOUNTER — Ambulatory Visit
Admission: RE | Admit: 2018-02-21 | Discharge: 2018-02-21 | Disposition: A | Discharge status: Home / Self Care | Payer: 59 | Source: Ambulatory Visit | Attending: Radiation Oncology | Admitting: Radiation Oncology

## 2018-02-21 DIAGNOSIS — C50411 Malignant neoplasm of upper-outer quadrant of right female breast: Secondary | ICD-10-CM | POA: Diagnosis not present

## 2018-02-21 DIAGNOSIS — Z510 Encounter for antineoplastic radiation therapy: Secondary | ICD-10-CM | POA: Diagnosis not present

## 2018-02-21 DIAGNOSIS — Z170 Estrogen receptor positive status [ER+]: Secondary | ICD-10-CM | POA: Insufficient documentation

## 2018-02-22 ENCOUNTER — Ambulatory Visit
Admission: RE | Admit: 2018-02-22 | Discharge: 2018-02-22 | Disposition: A | Discharge status: Home / Self Care | Payer: 59 | Source: Ambulatory Visit | Attending: Radiation Oncology | Admitting: Radiation Oncology

## 2018-02-22 ENCOUNTER — Inpatient Hospital Stay: Payer: 59 | Attending: Hematology and Oncology

## 2018-02-22 VITALS — BP 108/67 | HR 72 | Temp 98.0°F | Resp 18

## 2018-02-22 DIAGNOSIS — C50411 Malignant neoplasm of upper-outer quadrant of right female breast: Secondary | ICD-10-CM | POA: Insufficient documentation

## 2018-02-22 DIAGNOSIS — Z5112 Encounter for antineoplastic immunotherapy: Secondary | ICD-10-CM | POA: Diagnosis not present

## 2018-02-22 DIAGNOSIS — Z170 Estrogen receptor positive status [ER+]: Secondary | ICD-10-CM

## 2018-02-22 MED ORDER — DIPHENHYDRAMINE HCL 25 MG PO CAPS
ORAL_CAPSULE | ORAL | Status: AC
  Filled 2018-02-22: qty 2

## 2018-02-22 MED ORDER — SODIUM CHLORIDE 0.9% FLUSH
10.0000 mL | INTRAVENOUS | Status: DC | PRN
  Administered 2018-02-22: 10 mL
  Filled 2018-02-22: qty 10

## 2018-02-22 MED ORDER — DIPHENHYDRAMINE HCL 25 MG PO CAPS
50.0000 mg | ORAL_CAPSULE | Freq: Once | ORAL | Status: AC
  Administered 2018-02-22: 50 mg via ORAL

## 2018-02-22 MED ORDER — ACETAMINOPHEN 325 MG PO TABS
ORAL_TABLET | ORAL | Status: AC
  Filled 2018-02-22: qty 2

## 2018-02-22 MED ORDER — SODIUM CHLORIDE 0.9 % IV SOLN
Freq: Once | INTRAVENOUS | Status: AC
  Administered 2018-02-22: 15:00:00 via INTRAVENOUS

## 2018-02-22 MED ORDER — TRASTUZUMAB CHEMO 150 MG IV SOLR
450.0000 mg | Freq: Once | INTRAVENOUS | Status: AC
  Administered 2018-02-22: 450 mg via INTRAVENOUS
  Filled 2018-02-22: qty 21.43

## 2018-02-22 MED ORDER — ACETAMINOPHEN 325 MG PO TABS
650.0000 mg | ORAL_TABLET | Freq: Once | ORAL | Status: AC
  Administered 2018-02-22: 650 mg via ORAL

## 2018-02-22 MED ORDER — HEPARIN SOD (PORK) LOCK FLUSH 100 UNIT/ML IV SOLN
500.0000 [IU] | Freq: Once | INTRAVENOUS | Status: AC | PRN
  Administered 2018-02-22: 500 [IU]
  Filled 2018-02-22: qty 5

## 2018-02-23 ENCOUNTER — Ambulatory Visit: Payer: 59 | Admitting: Radiation Oncology

## 2018-02-23 ENCOUNTER — Ambulatory Visit
Admission: RE | Admit: 2018-02-23 | Discharge: 2018-02-23 | Disposition: A | Discharge status: Home / Self Care | Payer: 59 | Source: Ambulatory Visit | Attending: Radiation Oncology | Admitting: Radiation Oncology

## 2018-02-23 DIAGNOSIS — C50411 Malignant neoplasm of upper-outer quadrant of right female breast: Secondary | ICD-10-CM | POA: Diagnosis not present

## 2018-02-24 ENCOUNTER — Ambulatory Visit
Admission: RE | Admit: 2018-02-24 | Discharge: 2018-02-24 | Disposition: A | Discharge status: Home / Self Care | Payer: 59 | Source: Ambulatory Visit | Attending: Radiation Oncology | Admitting: Radiation Oncology

## 2018-02-24 DIAGNOSIS — C50411 Malignant neoplasm of upper-outer quadrant of right female breast: Secondary | ICD-10-CM | POA: Diagnosis not present

## 2018-02-25 ENCOUNTER — Ambulatory Visit
Admission: RE | Admit: 2018-02-25 | Discharge: 2018-02-25 | Disposition: A | Discharge status: Home / Self Care | Payer: 59 | Source: Ambulatory Visit | Attending: Radiation Oncology | Admitting: Radiation Oncology

## 2018-02-25 DIAGNOSIS — C50411 Malignant neoplasm of upper-outer quadrant of right female breast: Secondary | ICD-10-CM | POA: Diagnosis not present

## 2018-02-28 ENCOUNTER — Ambulatory Visit
Admission: RE | Admit: 2018-02-28 | Discharge: 2018-02-28 | Disposition: A | Discharge status: Home / Self Care | Payer: 59 | Source: Ambulatory Visit | Attending: Radiation Oncology | Admitting: Radiation Oncology

## 2018-02-28 DIAGNOSIS — C50411 Malignant neoplasm of upper-outer quadrant of right female breast: Secondary | ICD-10-CM | POA: Diagnosis not present

## 2018-03-01 ENCOUNTER — Ambulatory Visit
Admission: RE | Admit: 2018-03-01 | Discharge: 2018-03-01 | Disposition: A | Discharge status: Home / Self Care | Payer: 59 | Source: Ambulatory Visit | Attending: Radiation Oncology | Admitting: Radiation Oncology

## 2018-03-01 DIAGNOSIS — C50411 Malignant neoplasm of upper-outer quadrant of right female breast: Secondary | ICD-10-CM | POA: Diagnosis not present

## 2018-03-02 ENCOUNTER — Ambulatory Visit
Admission: RE | Admit: 2018-03-02 | Discharge: 2018-03-02 | Disposition: A | Discharge status: Home / Self Care | Payer: 59 | Source: Ambulatory Visit | Attending: Radiation Oncology | Admitting: Radiation Oncology

## 2018-03-02 DIAGNOSIS — C50411 Malignant neoplasm of upper-outer quadrant of right female breast: Secondary | ICD-10-CM | POA: Diagnosis not present

## 2018-03-03 ENCOUNTER — Ambulatory Visit
Admission: RE | Admit: 2018-03-03 | Discharge: 2018-03-03 | Disposition: A | Discharge status: Home / Self Care | Payer: 59 | Source: Ambulatory Visit | Attending: Radiation Oncology | Admitting: Radiation Oncology

## 2018-03-03 DIAGNOSIS — C50411 Malignant neoplasm of upper-outer quadrant of right female breast: Secondary | ICD-10-CM | POA: Diagnosis not present

## 2018-03-03 DIAGNOSIS — Z01419 Encounter for gynecological examination (general) (routine) without abnormal findings: Secondary | ICD-10-CM | POA: Diagnosis not present

## 2018-03-04 ENCOUNTER — Encounter: Payer: Self-pay | Admitting: Radiation Oncology

## 2018-03-04 ENCOUNTER — Ambulatory Visit
Admission: RE | Admit: 2018-03-04 | Discharge: 2018-03-04 | Disposition: A | Discharge status: Home / Self Care | Payer: 59 | Source: Ambulatory Visit | Attending: Radiation Oncology | Admitting: Radiation Oncology

## 2018-03-04 DIAGNOSIS — C50411 Malignant neoplasm of upper-outer quadrant of right female breast: Secondary | ICD-10-CM | POA: Diagnosis not present

## 2018-03-04 DIAGNOSIS — Z170 Estrogen receptor positive status [ER+]: Principal | ICD-10-CM

## 2018-03-04 MED ORDER — RADIAPLEXRX EX GEL
Freq: Once | CUTANEOUS | Status: AC
  Administered 2018-03-04: 16:00:00 via TOPICAL

## 2018-03-09 NOTE — Progress Notes (Signed)
  Radiation Oncology         [phone]) [phone] ________________________________  Name: Brianna [name] MRN: [id number]  Date: 03/04/2018  DOB: [birth year]  End of Treatment Note  Diagnosis:   37 y.o. female with Stage IA, pT1cN0M0, ER positive, HER2 amplified, grade 3 invasive ductal carcinoma of the right breast    Indication for treatment:  Curative       Radiation treatment dates:   01/19/2018 - 03/04/2018  Site/dose:   The patient initially received a dose of 50.4 Gy in 28 fractions to the right breast using whole-breast tangent fields. This was delivered using a 3-D conformal technique. The patient then received a boost to the seroma. This delivered an additional 10 Gy in 5 fractions using a 3-D technique. The total dose was 60.4 Gy.  Narrative: The patient tolerated radiation treatment relatively well.   The patient had some expected skin irritation as she progressed during treatment. Moist desquamation was not present at the end of treatment.  Plan: The patient has completed radiation treatment. The patient will return to radiation oncology clinic for routine followup in one month. I advised the patient to call or return sooner if they have any questions or concerns related to their recovery or treatment. ________________________________  Jodelle Gross, MD, PhD  This document serves as a record of services personally performed by Kyung Rudd, MD. It was created on his behalf by Rae Lips, a trained medical scribe. The creation of this record is based on the scribe's personal observations and the provider's statements to them. This document has been checked and approved by the attending provider.

## 2018-03-15 ENCOUNTER — Inpatient Hospital Stay: Payer: 59

## 2018-03-15 ENCOUNTER — Inpatient Hospital Stay (HOSPITAL_BASED_OUTPATIENT_CLINIC_OR_DEPARTMENT_OTHER): Payer: 59 | Admitting: Adult Health

## 2018-03-15 VITALS — BP 107/71 | HR 73 | Temp 98.1°F | Resp 20 | Ht 63.0 in | Wt 168.1 lb

## 2018-03-15 DIAGNOSIS — Z170 Estrogen receptor positive status [ER+]: Principal | ICD-10-CM

## 2018-03-15 DIAGNOSIS — C50411 Malignant neoplasm of upper-outer quadrant of right female breast: Secondary | ICD-10-CM

## 2018-03-15 DIAGNOSIS — Z5112 Encounter for antineoplastic immunotherapy: Secondary | ICD-10-CM | POA: Diagnosis not present

## 2018-03-15 LAB — COMPREHENSIVE METABOLIC PANEL
ALK PHOS: 62 U/L (ref 40–150)
ALT: 13 U/L (ref 0–55)
AST: 12 U/L (ref 5–34)
Albumin: 3.5 g/dL (ref 3.5–5.0)
Anion gap: 8 (ref 3–11)
BUN: 13 mg/dL (ref 7–26)
CALCIUM: 9.3 mg/dL (ref 8.4–10.4)
CHLORIDE: 108 mmol/L (ref 98–109)
CO2: 25 mmol/L (ref 22–29)
CREATININE: 0.98 mg/dL (ref 0.60–1.10)
Glucose, Bld: 98 mg/dL (ref 70–140)
Potassium: 3.8 mmol/L (ref 3.5–5.1)
Sodium: 141 mmol/L (ref 136–145)
Total Protein: 6.8 g/dL (ref 6.4–8.3)

## 2018-03-15 LAB — CBC WITH DIFFERENTIAL/PLATELET
BASOS PCT: 0 %
Basophils Absolute: 0 10*3/uL (ref 0.0–0.1)
EOS ABS: 0.1 10*3/uL (ref 0.0–0.5)
Eosinophils Relative: 3 %
HCT: 34.9 % (ref 34.8–46.6)
HEMOGLOBIN: 11.4 g/dL — AB (ref 11.6–15.9)
Lymphocytes Relative: 49 %
Lymphs Abs: 1.6 10*3/uL (ref 0.9–3.3)
MCH: 32.2 pg (ref 25.1–34.0)
MCHC: 32.7 g/dL (ref 31.5–36.0)
MCV: 98.6 fL (ref 79.5–101.0)
MONOS PCT: 7 %
Monocytes Absolute: 0.2 10*3/uL (ref 0.1–0.9)
NEUTROS PCT: 41 %
Neutro Abs: 1.3 10*3/uL — ABNORMAL LOW (ref 1.5–6.5)
PLATELETS: 194 10*3/uL (ref 145–400)
RBC: 3.54 MIL/uL — ABNORMAL LOW (ref 3.70–5.45)
RDW: 13.5 % (ref 11.2–14.5)
WBC: 3.2 10*3/uL — AB (ref 3.9–10.3)

## 2018-03-15 MED ORDER — DIPHENHYDRAMINE HCL 25 MG PO CAPS
50.0000 mg | ORAL_CAPSULE | Freq: Once | ORAL | Status: AC
  Administered 2018-03-15: 50 mg via ORAL

## 2018-03-15 MED ORDER — SODIUM CHLORIDE 0.9 % IV SOLN
Freq: Once | INTRAVENOUS | Status: AC
  Administered 2018-03-15: 14:00:00 via INTRAVENOUS

## 2018-03-15 MED ORDER — ACETAMINOPHEN 325 MG PO TABS
650.0000 mg | ORAL_TABLET | Freq: Once | ORAL | Status: AC
  Administered 2018-03-15: 650 mg via ORAL

## 2018-03-15 MED ORDER — DIPHENHYDRAMINE HCL 25 MG PO CAPS
ORAL_CAPSULE | ORAL | Status: AC
Start: 2018-03-15 — End: 2018-03-15
  Filled 2018-03-15: qty 2

## 2018-03-15 MED ORDER — SODIUM CHLORIDE 0.9% FLUSH
10.0000 mL | INTRAVENOUS | Status: DC | PRN
  Administered 2018-03-15: 10 mL
  Filled 2018-03-15: qty 10

## 2018-03-15 MED ORDER — ACETAMINOPHEN 325 MG PO TABS
ORAL_TABLET | ORAL | Status: AC
  Filled 2018-03-15: qty 2

## 2018-03-15 MED ORDER — TRASTUZUMAB CHEMO 150 MG IV SOLR
450.0000 mg | Freq: Once | INTRAVENOUS | Status: AC
  Administered 2018-03-15: 450 mg via INTRAVENOUS
  Filled 2018-03-15: qty 21.43

## 2018-03-15 MED ORDER — HEPARIN SOD (PORK) LOCK FLUSH 100 UNIT/ML IV SOLN
500.0000 [IU] | Freq: Once | INTRAVENOUS | Status: AC | PRN
  Administered 2018-03-15: 500 [IU]
  Filled 2018-03-15: qty 5

## 2018-03-15 NOTE — Progress Notes (Signed)
[facility] up:    Brianna [name], Brianna [name], [place] [address]   DIAGNOSIS: Cancer Staging Malignant neoplasm of upper-outer quadrant of right breast in female, estrogen receptor positive ([place]) Staging form: Breast, AJCC 8th Edition - Pathologic: Stage IA (pT1c, pN0, cM0, G3, ER: Positive, PR: Negative, HER2: Positive) - Signed by Gardenia Phlegm, NP on 08/25/2017   SUMMARY OF ONCOLOGIC HISTORY:   Malignant neoplasm of upper-outer quadrant of right breast in female, estrogen receptor positive ([place])   07/15/2017 Initial Diagnosis    Patient palpated a week mass in the right upper outer quadrant breast near the axilla in March 2018 mammogram revealed irregular spiculated mass 1.4 cm, by ultrasound measured 1.1 cm with a suspicious right axillary lymph node; biopsy revealed IDC grade 3, lymph node benign, ER 90%, PR 0%, Ki-67 40%, HER-2 positive ratio 2.6, T1c N0 stage IA, AJCC 8 clinical stage      08/06/2017 Genetic Testing    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  The Hereditary Gene Panel offered by [facility] includes sequencing and/or deletion duplication testing of the following 46 genes: APC, ATM, AXIN2, BARD1, BMPR1A, BRCA1, BRCA2, BRIP1, CDH1, CDKN2A (p14ARF), CDKN2A (p16INK4a), CHEK2, CTNNA1, DICER1, EPCAM (Deletion/duplication testing only), GREM1 (promoter region deletion/duplication testing only), KIT, MEN1, MLH1, MSH2, MSH3, MSH6, MUTYH, NBN, NF1, NHTL1, PALB2, PDGFRA, PMS2, POLD1, POLE, PTEN, RAD50, RAD51C, RAD51D, SDHB, SDHC, SDHD, SMAD4, SMARCA4. STK11, TP53, TSC1, TSC2, and VHL.  The following genes were evaluated for sequence changes only: SDHA and HOXB13 c.251G>A variant only.  The report date is August 06, 2017.      08/11/2017 Surgery    Right lumpectomy: IDC with DCIS, 1.4 cm, grade 3, DCIS focally involving posterior margin, 0/3 lymph nodes negative, ER 90%, PR 0%, HER-2  positive ratio 2.6, Ki-67 40% T1 CN 0 stage IA      09/07/2017 - 12/21/2017 Adjuvant Chemotherapy    TCH x 6, then maintenance Herceptin x 1 year (Taxotere not given 4 cycles 5 and 6)      01/19/2018 -  Radiation Therapy    Adjuvant radiation therapy       CURRENT THERAPY: Herceptin  INTERVAL HISTORY: Brianna [name] 37 y.o. female returns for evaluation of her ER positive, HER-2 positive breast cancer.  She is doing well today.  She continues to tolerate Herceptin well.  She completed radiation mid April.  It is time to discuss starting anti estrogen therapy.  She wants [place] to know that she has had frequent follow up with her gynecologist due to an abnormal pap.  She says that she was told that it had something to do with endometrial hyperplasia.  Her LMP was in 08/2017.   Patient Active Problem List   Diagnosis Date Noted  . Port-A-Cath in place 09/28/2017  . Superficial venous thrombosis of right upper extremity 09/27/2017  . Genetic testing 08/09/2017  . Malignant neoplasm of upper-outer quadrant of right breast in female, estrogen receptor positive ([place]) 07/22/2017  . IBS (irritable bowel syndrome) 05/25/2017  . Prediabetes 05/25/2017  . Acne vulgaris 12/18/2015    has No Known Allergies.  MEDICAL HISTORY: Past Medical History:  Diagnosis Date  . Acne   . History of concussion 08/2016  . IBS (irritable bowel syndrome)    no current med.  . Invasive ductal carcinoma of breast, right ([facility]) 07/2017  . Pre-diabetes     SURGICAL HISTORY: Past Surgical History:  Procedure  Laterality Date  . BREAST LUMPECTOMY WITH RADIOACTIVE SEED AND SENTINEL LYMPH NODE BIOPSY Right 08/11/2017   Procedure: RIGHT BREAST LUMPECTOMY WITH RADIOACTIVE SEED AND RIGHT SENTINEL LYMPH NODE BIOPSY;  Surgeon: Donnie Mesa, MD;  Location: [facility];  Service: General;  Laterality: Right;  ERAS PATHWAY  . PORTACATH PLACEMENT Left 08/11/2017   Procedure: INSERTION PORT-A-CATH WITH ULTRA  SOUND;  Surgeon: Donnie Mesa, MD;  Location: [facility];  Service: General;  Laterality: Left;  . WISDOM TOOTH EXTRACTION      SOCIAL HISTORY: Social History   Socioeconomic History  . Marital status: Married    Spouse name: Not on file  . Number of children: Not on file  . Years of education: Not on file  . Highest education level: Not on file  Occupational History  . Not on file  Social Needs  . Financial resource strain: Not on file  . Food insecurity:    Worry: Not on file    Inability: Not on file  . Transportation needs:    Medical: Not on file    Non-medical: Not on file  Tobacco Use  . Smoking status: Never Smoker  . Smokeless tobacco: Never Used  Substance and Sexual Activity  . Alcohol use: Yes    Comment: occasionally  . Drug use: No  . Sexual activity: Never  Lifestyle  . Physical activity:    Days per week: Not on file    Minutes per session: Not on file  . Stress: Not on file  Relationships  . Social connections:    Talks on phone: Not on file    Gets together: Not on file    Attends religious service: Not on file    Active member of club or organization: Not on file    Attends meetings of clubs or organizations: Not on file    Relationship status: Not on file  . Intimate partner violence:    Fear of current or ex partner: Not on file    Emotionally abused: Not on file    Physically abused: Not on file    Forced sexual activity: Not on file  Other Topics Concern  . Not on file  Social History Narrative  . Not on file    FAMILY HISTORY: Family History  Problem Relation Age of Onset  . Heart disease Maternal Grandmother        d.53  . Stroke Maternal Grandfather   . Diabetes Maternal Grandfather   . Hyperlipidemia Mother   . Diabetes Maternal Uncle   . Heart attack Other   . Obesity Other   . COPD Other   . Asthma Other   . Breast cancer Other 55       Maternal grandfather's sister    Review of Systems   Constitutional: Negative for appetite change, chills and fatigue.  HENT:   Negative for hearing loss, lump/mass and trouble swallowing.   Eyes: Negative for eye problems and icterus.  Respiratory: Negative for chest tightness, cough and shortness of breath.   Cardiovascular: Negative for chest pain, leg swelling and palpitations.  Gastrointestinal: Negative for abdominal distention, abdominal pain, constipation, diarrhea, nausea and vomiting.  Endocrine: Negative for hot flashes.  Skin: Negative for itching and rash.  Neurological: Negative for dizziness, extremity weakness, headaches and numbness.  Hematological: Negative for adenopathy. Does not bruise/bleed easily.  Psychiatric/Behavioral: Negative for depression. The patient is not nervous/anxious.       PHYSICAL EXAMINATION  ECOG PERFORMANCE STATUS: 0 -  Asymptomatic  Vitals:   03/15/18 1334  BP: 107/71  Pulse: 73  Resp: 20  Temp: 98.1 F (36.7 C)  SpO2: 99%    Physical Exam  Constitutional: She is oriented to person, place, and time and well-developed, well-nourished, and in no distress.  HENT:  Head: Normocephalic and atraumatic.  Mouth/Throat: Oropharynx is clear and moist. No oropharyngeal exudate.  Eyes: Pupils are equal, round, and reactive to light. No scleral icterus.  Neck: Neck supple.  Cardiovascular: Normal rate, regular rhythm and normal heart sounds.  Pulmonary/Chest: Effort normal and breath sounds normal.  Abdominal: Soft. Bowel sounds are normal. She exhibits no distension and no mass. There is no tenderness. There is no rebound and no guarding.  Musculoskeletal: She exhibits no edema.  Lymphadenopathy:    She has no cervical adenopathy.  Neurological: She is alert and oriented to person, place, and time.  Skin: Skin is warm and dry. No rash noted.  Psychiatric: Mood and affect normal.    LABORATORY DATA:  CBC    Component Value Date/Time   WBC 3.2 (L) 03/15/2018 1313   RBC 3.54 (L)  03/15/2018 1313   HGB 11.4 (L) 03/15/2018 1313   HGB 10.4 (L) 11/09/2017 1319   HCT 34.9 03/15/2018 1313   HCT 32.1 (L) 11/09/2017 1319   PLT 194 03/15/2018 1313   PLT 153 11/09/2017 1319   MCV 98.6 03/15/2018 1313   MCV 96.7 11/09/2017 1319   MCH 32.2 03/15/2018 1313   MCHC 32.7 03/15/2018 1313   RDW 13.5 03/15/2018 1313   RDW 18.3 (H) 11/09/2017 1319   LYMPHSABS 1.6 03/15/2018 1313   LYMPHSABS 3.8 (H) 11/09/2017 1319   MONOABS 0.2 03/15/2018 1313   MONOABS 0.6 11/09/2017 1319   EOSABS 0.1 03/15/2018 1313   EOSABS 0.1 11/09/2017 1319   BASOSABS 0.0 03/15/2018 1313   BASOSABS 0.0 11/09/2017 1319    CMP     Component Value Date/Time   NA 141 03/15/2018 1313   NA 138 11/09/2017 1319   K 3.8 03/15/2018 1313   K 3.1 (L) 11/09/2017 1319   CL 108 03/15/2018 1313   CO2 25 03/15/2018 1313   CO2 23 11/09/2017 1319   GLUCOSE 98 03/15/2018 1313   GLUCOSE 127 11/09/2017 1319   BUN 13 03/15/2018 1313   BUN 14.0 11/09/2017 1319   CREATININE 0.98 03/15/2018 1313   CREATININE 1.0 11/09/2017 1319   CALCIUM 9.3 03/15/2018 1313   CALCIUM 8.7 11/09/2017 1319   PROT 6.8 03/15/2018 1313   PROT 6.5 11/09/2017 1319   ALBUMIN 3.5 03/15/2018 1313   ALBUMIN 3.5 11/09/2017 1319   AST 12 03/15/2018 1313   AST 15 11/09/2017 1319   ALT 13 03/15/2018 1313   ALT 19 11/09/2017 1319   ALKPHOS 62 03/15/2018 1313   ALKPHOS 58 11/09/2017 1319   BILITOT <0.2 (L) 03/15/2018 1313   BILITOT <0.22 11/09/2017 1319   GFRNONAA >60 03/15/2018 1313   GFRAA >60 03/15/2018 1313           ASSESSMENT and PLAN:   Malignant neoplasm of upper-outer quadrant of right breast in female, estrogen receptor positive ([place]) Right lumpectomy: IDC with DCIS, 1.4 cm, grade 3, DCIS focally involving posterior margin, 0/3 lymph nodes negative, ER 90%, PR 0%, HER-2 positive ratio 2.6, Ki-67 40% T1 CN 0 stage IA Tumor board did not recommend surgery for the positive posterior margin issue  Recommendation: 1.  Adjuvant chemotherapy with [place] followed by Herceptin maintenance for a year started 09/07/2018-12/21/2017  3. Adjuvant radiation started 01/19/2018-03/04/2018 4. Followed by adjuvant antiestrogen therapy -------------------------------------------------------------------- Treatment plan: Continue maintenance Herceptin Herceptin toxicities: None  Holden and I reviewed anti estrogen therapy options in detail.  We reviewed the possible endometrial hyperplasia concern regarding Tamoxifen, as thickening of the endometrium is a side effect of Tamoxifen as well which can rarely lead to endometrial cancer.  I requested that Kendall have her gynecologist send Dr. Lindi Adie her findings so we can review those results.  I reviewed other anti estrogen options as well such as Zoladex and Anastrozole.  I gave her information about Zoladex, Anastrozole, and Tamoxifen in her AVS to read about.  We reviewed what it means to be in menopause.  Her LMP was in October, so not quite one year without menses.  After review of the issues above, I will not prescribe anti estrogen therapy today.  Instead, I will discuss the above concerns with Dr. Lindi Adie upon his return from vacation, in order to get a plan from him regarding future management.    Sherryann will return in 3 weeks for labs, f/u with Dr. Lindi Adie, and Herceptin.      All questions were answered. The patient knows to call the clinic with any problems, questions or concerns. We can certainly see the patient much sooner if necessary.  A total of (30) minutes of face-to-face time was spent with this patient with greater than 50% of that time in counseling and care-coordination.  This note was electronically signed. Scot Dock, NP 03/16/2018

## 2018-03-15 NOTE — Patient Instructions (Signed)
[facility] Discharge Instructions for Patients Receiving Chemotherapy  Today you received the following chemotherapy agents Herceptin  To help prevent nausea and vomiting after your treatment, we encourage you to take your nausea medication as directed   If you develop nausea and vomiting that is not controlled by your nausea medication, call the clinic.   BELOW ARE SYMPTOMS THAT SHOULD BE REPORTED IMMEDIATELY:  *FEVER GREATER THAN 100.5 F  *CHILLS WITH OR WITHOUT FEVER  NAUSEA AND VOMITING THAT IS NOT CONTROLLED WITH YOUR NAUSEA MEDICATION  *UNUSUAL SHORTNESS OF BREATH  *UNUSUAL BRUISING OR BLEEDING  TENDERNESS IN MOUTH AND THROAT WITH OR WITHOUT PRESENCE OF ULCERS  *URINARY PROBLEMS  *BOWEL PROBLEMS  UNUSUAL RASH Items with * indicate a potential emergency and should be followed up as soon as possible.  Feel free to call the clinic should you have any questions or concerns. The clinic phone number is [phone].  Please show the CHEMO ALERT CARD at check-in to the Emergency Department and triage nurse.   

## 2018-03-15 NOTE — Patient Instructions (Signed)
Tamoxifen oral tablet What is this medicine? TAMOXIFEN (ta MOX i fen) blocks the effects of estrogen. It is commonly used to treat breast cancer. It is also used to decrease the chance of breast cancer coming back in women who have received treatment for the disease. It may also help prevent breast cancer in women who have a high risk of developing breast cancer. This medicine may be used for other purposes; ask your health care provider or pharmacist if you have questions. COMMON BRAND NAME(S): Nolvadex What should I tell my health care provider before I take this medicine? They need to know if you have any of these conditions: -blood clots -blood disease -cataracts or impaired eyesight -endometriosis -high calcium levels -high cholesterol -irregular menstrual cycles -liver disease -stroke -uterine fibroids -an unusual reaction to tamoxifen, other medicines, foods, dyes, or preservatives -pregnant or trying to get pregnant -breast-feeding How should I use this medicine? Take this medicine by mouth with a glass of water. Follow the directions on the prescription label. You can take it with or without food. Take your medicine at regular intervals. Do not take your medicine more often than directed. Do not stop taking except on your doctor's advice. A special MedGuide will be given to you by the pharmacist with each prescription and refill. Be sure to read this information carefully each time. Talk to your pediatrician regarding the use of this medicine in children. While this drug may be prescribed for selected conditions, precautions do apply. Overdosage: If you think you have taken too much of this medicine contact a poison control center or emergency room at once. NOTE: This medicine is only for you. Do not share this medicine with others. What if I miss a dose? If you miss a dose, take it as soon as you can. If it is almost time for your next dose, take only that dose. Do not take  double or extra doses. What may interact with this medicine? Do not take this medicine with any of the following medications: -cisapride -certain medicines for irregular heart beat like dofetilide, dronedarone, quinidine -certain medicines for fungal infection like fluconazole, posaconazole -pimozide -saquinavir -thioridazine This medicine may also interact with the following medications: -aminoglutethimide -anastrozole -bromocriptine -chemotherapy drugs -female hormones, like estrogens and birth control pills -letrozole -medroxyprogesterone -phenobarbital -rifampin -warfarin This list may not describe all possible interactions. Give your health care provider a list of all the medicines, herbs, non-prescription drugs, or dietary supplements you use. Also tell them if you smoke, drink alcohol, or use illegal drugs. Some items may interact with your medicine. What should I watch for while using this medicine? Visit your doctor or health care professional for regular checks on your progress. You will need regular pelvic exams, breast exams, and mammograms. If you are taking this medicine to reduce your risk of getting breast cancer, you should know that this medicine does not prevent all types of breast cancer. If breast cancer or other problems occur, there is no guarantee that it will be found at an early stage. Do not become pregnant while taking this medicine or for 2 months after stopping this medicine. Stop taking this medicine if you get pregnant or think you are pregnant and contact your doctor. This medicine may harm your unborn baby. Women who can possibly become pregnant should use birth control methods that do not use hormones during tamoxifen treatment and for 2 months after therapy has stopped. Talk with your health care provider for birth control advice.  Do not breast feed while taking this medicine. What side effects may I notice from receiving this medicine? Side effects that  you should report to your doctor or health care professional as soon as possible: -allergic reactions like skin rash, itching or hives, swelling of the face, lips, or tongue -changes in vision -changes in your menstrual cycle -difficulty walking or talking -new breast lumps -numbness -pelvic pain or pressure -redness, blistering, peeling or loosening of the skin, including inside the mouth -signs and symptoms of a dangerous change in heartbeat or heart rhythm like chest pain, dizziness, fast or irregular heartbeat, palpitations, feeling faint or lightheaded, falls, breathing problems -sudden chest pain -swelling, pain or tenderness in your calf or leg -unusual bruising or bleeding -vaginal discharge that is bloody, brown, or rust -weakness -yellowing of the whites of the eyes or skin Side effects that usually do not require medical attention (report to your doctor or health care professional if they continue or are bothersome): -fatigue -hair loss, although uncommon and is usually mild -headache -hot flashes -impotence (in men) -nausea, vomiting (mild) -vaginal discharge (white or clear) This list may not describe all possible side effects. Call your doctor for medical advice about side effects. You may report side effects to FDA at 1-800-FDA-1088. Where should I keep my medicine? Keep out of the reach of children. Store at room temperature between 20 and 25 degrees C (68 and 77 degrees F). Protect from light. Keep container tightly closed. Throw away any unused medicine after the expiration date. NOTE: This sheet is a summary. It may not cover all possible information. If you have questions about this medicine, talk to your doctor, pharmacist, or health care provider.  2018 Elsevier/Gold Standard (2016-05-29 07:27:41)  Anastrozole tablets What is this medicine? ANASTROZOLE (an AS troe zole) is used to treat breast cancer in women who have gone through menopause. Some types of  breast cancer depend on estrogen to grow, and this medicine can stop tumor growth by blocking estrogen production. This medicine may be used for other purposes; ask your health care provider or pharmacist if you have questions. COMMON BRAND NAME(S): Arimidex What should I tell my health care provider before I take this medicine? They need to know if you have any of these conditions: -liver disease -an unusual or allergic reaction to anastrozole, other medicines, foods, dyes, or preservatives -pregnant or trying to get pregnant -breast-feeding How should I use this medicine? Take this medicine by mouth with a glass of water. Follow the directions on the prescription label. You can take this medicine with or without food. Take your doses at regular intervals. Do not take your medicine more often than directed. Do not stop taking except on the advice of your doctor or health care professional. Talk to your pediatrician regarding the use of this medicine in children. Special care may be needed. Overdosage: If you think you have taken too much of this medicine contact a poison control center or emergency room at once. NOTE: This medicine is only for you. Do not share this medicine with others. What if I miss a dose? If you miss a dose, take it as soon as you can. If it is almost time for your next dose, take only that dose. Do not take double or extra doses. What may interact with this medicine? Do not take this medicine with any of the following medications: -female hormones, like estrogens or progestins and birth control pills This medicine may also interact with the   medications: -tamoxifen This list may not describe all possible interactions. Give your health care provider a list of all the medicines, herbs, non-prescription drugs, or dietary supplements you use. Also tell them if you smoke, drink alcohol, or use illegal drugs. Some items may interact with your medicine. What should I watch  for while using this medicine? Visit your doctor or health care professional for regular checks on your progress. Let your doctor or health care professional know about any unusual vaginal bleeding. Do not treat yourself for diarrhea, nausea, vomiting or other side effects. Ask your doctor or health care professional for advice. What side effects may I notice from receiving this medicine? Side effects that you should report to your doctor or health care professional as soon as possible: -allergic reactions like skin rash, itching or hives, swelling of the face, lips, or tongue -any new or unusual symptoms -breathing problems -chest pain -leg pain or swelling -vomiting Side effects that usually do not require medical attention (report to your doctor or health care professional if they continue or are bothersome): -back or bone pain -cough, or throat infection -diarrhea or constipation -dizziness -headache -hot flashes -loss of appetite -nausea -sweating -weakness and tiredness -weight gain This list may not describe all possible side effects. Call your doctor for medical advice about side effects. You may report side effects to FDA at 1-800-FDA-1088. Where should I keep my medicine? Keep out of the reach of children. Store at room temperature between 20 and 25 degrees C (68 and 77 degrees F). Throw away any unused medicine after the expiration date. NOTE: This sheet is a summary. It may not cover all possible information. If you have questions about this medicine, talk to your doctor, pharmacist, or health care provider.  2018 Elsevier/Gold Standard (2008-01-20 16:31:52) Goserelin injection What is this medicine? GOSERELIN (GOE se rel in) is similar to a hormone found in the body. It lowers the amount of sex hormones that the body makes. Men will have lower testosterone levels and women will have lower estrogen levels while taking this medicine. In men, this medicine is used to treat  prostate cancer; the injection is either given once per month or once every 12 weeks. A once per month injection (only) is used to treat women with endometriosis, dysfunctional uterine bleeding, or advanced breast cancer. This medicine may be used for other purposes; ask your health care provider or pharmacist if you have questions. COMMON BRAND NAME(S): Zoladex What should I tell my health care provider before I take this medicine? They need to know if you have any of these conditions (some only apply to women): -diabetes -heart disease or previous heart attack -high blood pressure -high cholesterol -kidney disease -osteoporosis or low bone density -problems passing urine -spinal cord injury -stroke -tobacco smoker -an unusual or allergic reaction to goserelin, hormone therapy, other medicines, foods, dyes, or preservatives -pregnant or trying to get pregnant -breast-feeding How should I use this medicine? This medicine is for injection under the skin. It is given by a health care professional in a hospital or clinic setting. Men receive this injection once every 4 weeks or once every 12 weeks. Women will only receive the once every 4 weeks injection. Talk to your pediatrician regarding the use of this medicine in children. Special care may be needed. Overdosage: If you think you have taken too much of this medicine contact a poison control center or emergency room at once. NOTE: This medicine is only for you. Do  not share this medicine with others. What if I miss a dose? It is important not to miss your dose. Call your doctor or health care professional if you are unable to keep an appointment. What may interact with this medicine? -female hormones like estrogen -herbal or dietary supplements like black cohosh, chasteberry, or DHEA -female hormones like testosterone -prasterone This list may not describe all possible interactions. Give your health care provider a list of all the  medicines, herbs, non-prescription drugs, or dietary supplements you use. Also tell them if you smoke, drink alcohol, or use illegal drugs. Some items may interact with your medicine. What should I watch for while using this medicine? Visit your doctor or health care professional for regular checks on your progress. Your symptoms may appear to get worse during the first weeks of this therapy. Tell your doctor or healthcare professional if your symptoms do not start to get better or if they get worse after this time. Your bones may get weaker if you take this medicine for a long time. If you smoke or frequently drink alcohol you may increase your risk of bone loss. A family history of osteoporosis, chronic use of drugs for seizures (convulsions), or corticosteroids can also increase your risk of bone loss. Talk to your doctor about how to keep your bones strong. This medicine should stop regular monthly menstration in women. Tell your doctor if you continue to [facility]. Women should not become pregnant while taking this medicine or for 12 weeks after stopping this medicine. Women should inform their doctor if they wish to become pregnant or think they might be pregnant. There is a potential for serious side effects to an unborn child. Talk to your health care professional or pharmacist for more information. Do not breast-feed an infant while taking this medicine. Men should inform their doctors if they wish to father a child. This medicine may lower sperm counts. Talk to your health care professional or pharmacist for more information. What side effects may I notice from receiving this medicine? Side effects that you should report to your doctor or health care professional as soon as possible: -allergic reactions like skin rash, itching or hives, swelling of the face, lips, or tongue -bone pain -breathing problems -changes in vision -chest pain -feeling faint or lightheaded, falls -fever,  chills -pain, swelling, warmth in the leg -pain, tingling, numbness in the hands or feet -signs and symptoms of low blood pressure like dizziness; feeling faint or lightheaded, falls; unusually weak or tired -stomach pain -swelling of the ankles, feet, hands -trouble passing urine or change in the amount of urine -unusually high or low blood pressure -unusually weak or tired Side effects that usually do not require medical attention (report to your doctor or health care professional if they continue or are bothersome): -change in sex drive or performance -changes in breast size in both males and females -changes in emotions or moods -headache -hot flashes -irritation at site where injected -loss of appetite -skin problems like acne, dry skin -vaginal dryness This list may not describe all possible side effects. Call your doctor for medical advice about side effects. You may report side effects to FDA at 1-800-FDA-1088. Where should I keep my medicine? This drug is given in a hospital or clinic and will not be stored at home. NOTE: This sheet is a summary. It may not cover all possible information. If you have questions about this medicine, talk to your doctor, pharmacist, or health care provider.  2018 Elsevier/Gold Standard (2014-01-16 11:10:35)

## 2018-03-15 NOTE — Assessment & Plan Note (Addendum)
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 Adjuvant chemotherapy with [place] followed by Herceptin maintenance for a year started 09/07/2018-12/21/2017  3. Adjuvant radiation started 01/19/2018-03/04/2018 4. Followed by adjuvant antiestrogen therapy -------------------------------------------------------------------- Treatment plan: Continue maintenance Herceptin Herceptin toxicities: None  Liane and I reviewed anti estrogen therapy options in detail.  We reviewed the possible endometrial hyperplasia concern regarding Tamoxifen, as thickening of the endometrium is a side effect of Tamoxifen as well which can rarely lead to endometrial cancer.  I requested that Zion have her gynecologist send Dr. Lindi Adie her findings so we can review those results.  I reviewed other anti estrogen options as well such as Zoladex and Anastrozole.  I gave her information about Zoladex, Anastrozole, and Tamoxifen in her AVS to read about.  We reviewed what it means to be in menopause.  Her LMP was in October, so not quite one year without menses.  After review of the issues above, I will not prescribe anti estrogen therapy today.  Instead, I will discuss the above concerns with Dr. Lindi Adie upon his return from vacation, in order to get a plan from him regarding future management.    Luxe will return in 3 weeks for labs, f/u with Dr. Lindi Adie, and Herceptin.

## 2018-03-16 ENCOUNTER — Encounter: Payer: Self-pay | Admitting: Adult Health

## 2018-04-04 ENCOUNTER — Other Ambulatory Visit: Payer: Self-pay

## 2018-04-04 ENCOUNTER — Encounter: Payer: Self-pay | Admitting: Radiation Oncology

## 2018-04-04 ENCOUNTER — Ambulatory Visit
Admission: RE | Admit: 2018-04-04 | Discharge: 2018-04-04 | Disposition: A | Discharge status: Home / Self Care | Payer: 59 | Source: Ambulatory Visit | Attending: Radiation Oncology | Admitting: Radiation Oncology

## 2018-04-04 VITALS — BP 99/74 | HR 74 | Temp 98.9°F | Resp 20 | Wt 165.4 lb

## 2018-04-04 DIAGNOSIS — C50911 Malignant neoplasm of unspecified site of right female breast: Secondary | ICD-10-CM | POA: Insufficient documentation

## 2018-04-04 DIAGNOSIS — Z170 Estrogen receptor positive status [ER+]: Secondary | ICD-10-CM | POA: Insufficient documentation

## 2018-04-04 DIAGNOSIS — M79621 Pain in right upper arm: Secondary | ICD-10-CM | POA: Diagnosis not present

## 2018-04-04 DIAGNOSIS — C50411 Malignant neoplasm of upper-outer quadrant of right female breast: Secondary | ICD-10-CM

## 2018-04-04 DIAGNOSIS — Z923 Personal history of irradiation: Secondary | ICD-10-CM | POA: Insufficient documentation

## 2018-04-04 DIAGNOSIS — Z79899 Other long term (current) drug therapy: Secondary | ICD-10-CM | POA: Insufficient documentation

## 2018-04-04 MED ORDER — CEPHALEXIN 500 MG PO CAPS: 500.0000 mg | ORAL_CAPSULE | Freq: Three times a day (TID) | ORAL | 0 refills | Status: DC

## 2018-04-04 NOTE — Progress Notes (Signed)
Radiation Oncology         [phone] ________________________________  Name: Brianna [name] MRN: [id number]  Date of Service: 04/04/2018  DOB: 20-Jan-1981  Post Treatment Note  CC: [name], [name], MD  Nicholas Lose, MD  Diagnosis:   Stage IA,pT1cN0M0, ER positive, HER2 amplified, grade 3 invasive ductal carcinoma of the right breast.  Interval Since Last Radiation: 5 weeks   01/19/2018 - 03/04/2018: The patient initially received a dose of 50.4 Gy in 28 fractions to the right breast using whole-breast tangent fields. This was delivered using a 3-D conformal technique. The patient then received a boost to the seroma. This delivered an additional 10 Gy in 5 fractions using a 3-D technique. The total dose was 60.4 Gy.   Narrative:  The patient returns today for routine follow-up. During treatment she did very well with radiotherapy and did not have significant desquamation.                             On review of systems, the patient states she is doing okay but is having some concerns of soreness along her right axillary incision site. Prior to treatment, she did have intermittent drainage from this site, and I thought it was a suture granuloma. The skin sealed over, and she noticed the soreness over the weekend. She denies any fevers or chills, or redness of the skin. No drainage has been seen for months. The skin is resolving, but that site did have some dry peeling.   ALLERGIES:  has No Known Allergies.  Meds: Current Outpatient Medications  Medication Sig Dispense Refill  . Clindamycin-Benzoyl Per, Refr, gel Apply to face once daily in the morning.    . Famotidine (PEPCID AC PO) Take by mouth daily.    Marland Kitchen lidocaine-prilocaine (EMLA) cream Apply to affected area once 30 g 3  . LORazepam (ATIVAN) 0.5 MG tablet Take 0.5 mg by mouth at bedtime.    . naproxen sodium (ANAPROX) 220 MG tablet Take 220 mg by mouth as needed.    Marland Kitchen spironolactone (ALDACTONE) 100 MG tablet Take 100 mg by  mouth daily.    . Sulfacetamide Sodium-Sulfur (CLENIA FOAMING [place]) 10-5 % EMUL Apply topically.    . tretinoin (RETIN-A) 0.025 % gel Apply to face nightly    . cephALEXin (KEFLEX) 500 MG capsule Take 1 capsule (500 mg total) by mouth 3 (three) times daily. 21 capsule 0   No current facility-administered medications for this encounter.     Physical Findings:  weight is 165 lb 6.4 oz (75 kg). Her oral temperature is 98.9 F (37.2 C). Her blood pressure is 99/74 and her pulse is 74. Her respiration is 20 and oxygen saturation is 99%.  In general this is a well appearing caucasian female in no acute distress. She's alert and oriented x4 and appropriate throughout the examination. Cardiopulmonary assessment is negative for acute distress and she exhibits normal effort. The right breast was examined and reveals mild hyperpigmentation. She does have some fullness along the medial aspect of the incision line. She does not have any erythema and the area is about 5 mm, well circumscribed, and tender.   Lab Findings: Lab Results  Component Value Date   WBC 3.2 (L) 03/15/2018   HGB 11.4 (L) 03/15/2018   HCT 34.9 03/15/2018   MCV 98.6 03/15/2018   PLT 194 03/15/2018     Radiographic Findings: No results found.  Impression/Plan: 1. Stage IA,pT1cN0M0,  ER positive, HER2 amplified, grade 3 invasive ductal carcinoma of the right breast. The patient has been doing well since completion of radiotherapy. We discussed that we would be happy to continue to follow her as needed, but she will also continue to follow up with Dr. Lindi Adie while receiving Herceptin in medical oncology. She was counseled on skin care as well as measures to avoid sun exposure to this area.  2. Right Axillary fullness. The patient may have retained suture causing her symptoms versus infected seroma, versus lymph node. I discussed the rationale to consider empiric antibiotics, and if this is persistent after antibiotic therapy, she  will contact Dr. Gershon Crane. Keflex 500 mg TID for 7 days was called into her pharmacy.      Carola Rhine, PAC

## 2018-04-05 ENCOUNTER — Inpatient Hospital Stay: Payer: 59 | Attending: Hematology and Oncology

## 2018-04-05 ENCOUNTER — Inpatient Hospital Stay (HOSPITAL_BASED_OUTPATIENT_CLINIC_OR_DEPARTMENT_OTHER): Payer: 59 | Admitting: Hematology and Oncology

## 2018-04-05 ENCOUNTER — Inpatient Hospital Stay: Payer: 59

## 2018-04-05 DIAGNOSIS — D709 Neutropenia, unspecified: Secondary | ICD-10-CM

## 2018-04-05 DIAGNOSIS — Z170 Estrogen receptor positive status [ER+]: Secondary | ICD-10-CM | POA: Diagnosis not present

## 2018-04-05 DIAGNOSIS — C50411 Malignant neoplasm of upper-outer quadrant of right female breast: Secondary | ICD-10-CM

## 2018-04-05 DIAGNOSIS — Z95828 Presence of other vascular implants and grafts: Secondary | ICD-10-CM

## 2018-04-05 DIAGNOSIS — Z5112 Encounter for antineoplastic immunotherapy: Secondary | ICD-10-CM | POA: Insufficient documentation

## 2018-04-05 DIAGNOSIS — C50412 Malignant neoplasm of upper-outer quadrant of left female breast: Secondary | ICD-10-CM | POA: Diagnosis not present

## 2018-04-05 LAB — CBC WITH DIFFERENTIAL/PLATELET
BASOS PCT: 0 %
Basophils Absolute: 0 10*3/uL (ref 0.0–0.1)
Eosinophils Absolute: 0.1 10*3/uL (ref 0.0–0.5)
Eosinophils Relative: 2 %
HEMATOCRIT: 37.2 % (ref 34.8–46.6)
Hemoglobin: 12.2 g/dL (ref 11.6–15.9)
Lymphocytes Relative: 48 %
Lymphs Abs: 1.8 10*3/uL (ref 0.9–3.3)
MCH: 32 pg (ref 25.1–34.0)
MCHC: 32.8 g/dL (ref 31.5–36.0)
MCV: 97.5 fL (ref 79.5–101.0)
MONO ABS: 0.3 10*3/uL (ref 0.1–0.9)
MONOS PCT: 8 %
Neutro Abs: 1.6 10*3/uL (ref 1.5–6.5)
Neutrophils Relative %: 42 %
Platelets: 197 10*3/uL (ref 145–400)
RBC: 3.81 MIL/uL (ref 3.70–5.45)
RDW: 14 % (ref 11.2–14.5)
WBC: 3.7 10*3/uL — ABNORMAL LOW (ref 3.9–10.3)

## 2018-04-05 LAB — COMPREHENSIVE METABOLIC PANEL
ALT: 15 U/L (ref 0–55)
ANION GAP: 5 (ref 3–11)
AST: 15 U/L (ref 5–34)
Albumin: 3.8 g/dL (ref 3.5–5.0)
Alkaline Phosphatase: 69 U/L (ref 40–150)
BUN: 13 mg/dL (ref 7–26)
CO2: 27 mmol/L (ref 22–29)
Calcium: 9.2 mg/dL (ref 8.4–10.4)
Chloride: 105 mmol/L (ref 98–109)
Creatinine, Ser: 0.84 mg/dL (ref 0.60–1.10)
Glucose, Bld: 101 mg/dL (ref 70–140)
POTASSIUM: 3.6 mmol/L (ref 3.5–5.1)
Sodium: 137 mmol/L (ref 136–145)
Total Bilirubin: <0.2 mg/dL — ABNORMAL LOW (ref 0.2–1.2)
Total Protein: 7.2 g/dL (ref 6.4–8.3)

## 2018-04-05 MED ORDER — TRASTUZUMAB CHEMO 150 MG IV SOLR
450.0000 mg | Freq: Once | INTRAVENOUS | Status: AC
  Administered 2018-04-05: 450 mg via INTRAVENOUS
  Filled 2018-04-05: qty 21.43

## 2018-04-05 MED ORDER — ACETAMINOPHEN 325 MG PO TABS
650.0000 mg | ORAL_TABLET | Freq: Once | ORAL | Status: AC
  Administered 2018-04-05: 650 mg via ORAL

## 2018-04-05 MED ORDER — DIPHENHYDRAMINE HCL 25 MG PO CAPS
ORAL_CAPSULE | ORAL | Status: AC
  Filled 2018-04-05: qty 2

## 2018-04-05 MED ORDER — ACETAMINOPHEN 325 MG PO TABS
ORAL_TABLET | ORAL | Status: AC
  Filled 2018-04-05: qty 2

## 2018-04-05 MED ORDER — SODIUM CHLORIDE 0.9% FLUSH
10.0000 mL | INTRAVENOUS | Status: DC | PRN
  Administered 2018-04-05: 10 mL
  Filled 2018-04-05: qty 10

## 2018-04-05 MED ORDER — DIPHENHYDRAMINE HCL 25 MG PO CAPS
50.0000 mg | ORAL_CAPSULE | Freq: Once | ORAL | Status: AC
  Administered 2018-04-05: 50 mg via ORAL

## 2018-04-05 MED ORDER — SODIUM CHLORIDE 0.9% FLUSH
10.0000 mL | INTRAVENOUS | Status: DC | PRN
  Administered 2018-04-05: 10 mL via INTRAVENOUS
  Filled 2018-04-05: qty 10

## 2018-04-05 MED ORDER — HEPARIN SOD (PORK) LOCK FLUSH 100 UNIT/ML IV SOLN
500.0000 [IU] | Freq: Once | INTRAVENOUS | Status: AC | PRN
  Administered 2018-04-05: 500 [IU]
  Filled 2018-04-05: qty 5

## 2018-04-05 MED ORDER — SODIUM CHLORIDE 0.9 % IV SOLN
Freq: Once | INTRAVENOUS | Status: AC
  Administered 2018-04-05: 15:00:00 via INTRAVENOUS

## 2018-04-05 NOTE — Patient Instructions (Signed)
[facility] Discharge Instructions for Patients Receiving Chemotherapy  Today you received the following chemotherapy agents Herceptin  To help prevent nausea and vomiting after your treatment, we encourage you to take your nausea medication as directed   If you develop nausea and vomiting that is not controlled by your nausea medication, call the clinic.   BELOW ARE SYMPTOMS THAT SHOULD BE REPORTED IMMEDIATELY:  *FEVER GREATER THAN 100.5 F  *CHILLS WITH OR WITHOUT FEVER  NAUSEA AND VOMITING THAT IS NOT CONTROLLED WITH YOUR NAUSEA MEDICATION  *UNUSUAL SHORTNESS OF BREATH  *UNUSUAL BRUISING OR BLEEDING  TENDERNESS IN MOUTH AND THROAT WITH OR WITHOUT PRESENCE OF ULCERS  *URINARY PROBLEMS  *BOWEL PROBLEMS  UNUSUAL RASH Items with * indicate a potential emergency and should be followed up as soon as possible.  Feel free to call the clinic should you have any questions or concerns. The clinic phone number is [phone].  Please show the CHEMO ALERT CARD at check-in to the Emergency Department and triage nurse.   

## 2018-04-05 NOTE — Assessment & Plan Note (Signed)
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 Adjuvant chemotherapy with [place] followed by Herceptin maintenance for a year started 09/07/2018-12/21/2017  3. Adjuvant radiation started 01/19/2018 4. Followed by adjuvant antiestrogen therapy with tamoxifen that was started prior to surgery -------------------------------------------------------------------- Treatment plan: Continue maintenance Herceptin Herceptin toxicities: None Patient went on a cruise trip and had a great time in the [place].  Neutropenia: Probably due to radiation therapy ANC 1000.  Return to clinic every 3 weeks for Herceptin and every 6 weeks for follow-up with me with labs.  Herceptin will likely be to be completed end of September or October 2019

## 2018-04-05 NOTE — Progress Notes (Signed)
Patient Care Team: Brianna [name], [name], MD as PCP - General (Family Medicine)  DIAGNOSIS:  Encounter Diagnosis  Name Primary?  . Malignant neoplasm of upper-outer quadrant of right breast in female, estrogen receptor positive (Brianna [name])     SUMMARY OF ONCOLOGIC HISTORY:   Malignant neoplasm of upper-outer quadrant of right breast in female, estrogen receptor positive (Brianna [name])   07/15/2017 Initial Diagnosis    Patient palpated a week mass in the right upper outer quadrant breast near the axilla in March 2018 mammogram revealed irregular spiculated mass 1.4 cm, by ultrasound measured 1.1 cm with a suspicious right axillary lymph node; biopsy revealed IDC grade 3, lymph node benign, ER 90%, PR 0%, Ki-67 40%, HER-2 positive ratio 2.6, T1c N0 stage IA, AJCC 8 clinical stage      08/06/2017 Genetic Testing    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  The Hereditary Gene Panel offered by [facility] includes sequencing and/or deletion duplication testing of the following 46 genes: APC, ATM, AXIN2, BARD1, BMPR1A, BRCA1, BRCA2, BRIP1, CDH1, CDKN2A (p14ARF), CDKN2A (p16INK4a), CHEK2, CTNNA1, DICER1, EPCAM (Deletion/duplication testing only), GREM1 (promoter region deletion/duplication testing only), KIT, MEN1, MLH1, MSH2, MSH3, MSH6, MUTYH, NBN, NF1, NHTL1, PALB2, PDGFRA, PMS2, POLD1, POLE, PTEN, RAD50, RAD51C, RAD51D, SDHB, SDHC, SDHD, SMAD4, SMARCA4. STK11, TP53, TSC1, TSC2, and VHL.  The following genes were evaluated for sequence changes only: SDHA and HOXB13 c.251G>A variant only.  The report date is August 06, 2017.      08/11/2017 Surgery    Right lumpectomy: IDC with DCIS, 1.4 cm, grade 3, DCIS focally involving posterior margin, 0/3 lymph nodes negative, ER 90%, PR 0%, HER-2 positive ratio 2.6, Ki-67 40% T1 CN 0 stage IA      09/07/2017 - 12/21/2017 Adjuvant Chemotherapy    TCH x 6, then maintenance Herceptin x 1 year (Taxotere not given 4 cycles 5 and 6)      01/19/2018 - 03/04/2018 Radiation Therapy    Adjuvant radiation therapy       CHIEF COMPLIANT: Follow-up on Herceptin maintenance  INTERVAL HISTORY: Brianna [name] is a 37 year old with above-mentioned history of right breast cancer treated with lumpectomy followed by adjuvant chemotherapy and she is currently on Herceptin maintenance therapy.  She appears to be tolerating Herceptin extremely well.  She completed adjuvant radiation therapy.  She denies any nausea vomiting.  Denies any diarrhea.  Her biggest complaint is fatigue.  She has been able to work full-time but she does not have much energy afterwards.  REVIEW OF SYSTEMS:   Constitutional: Denies fevers, chills or abnormal weight loss Eyes: Denies blurriness of vision Ears, nose, mouth, throat, and face: Denies mucositis or sore throat Respiratory: Denies cough, dyspnea or wheezes Cardiovascular: Denies palpitation, chest discomfort Gastrointestinal:  Denies nausea, heartburn or change in bowel habits Skin: Denies abnormal skin rashes Lymphatics: Denies new lymphadenopathy or easy bruising Neurological:Denies numbness, tingling or new weaknesses Behavioral/Psych: Mood is stable, no new changes  Extremities: No lower extremity edema Breast:  denies any pain or lumps or nodules in either breasts All other systems were reviewed with the patient and are negative.  I have reviewed the past medical history, past surgical history, social history and family history with the patient and they are unchanged from previous note.  ALLERGIES:  has No Known Allergies.  MEDICATIONS:  Current Outpatient Medications  Medication Sig Dispense Refill  . cephALEXin (KEFLEX) 500 MG capsule Take 1 capsule (500 mg total) by mouth 3 (three) times daily.  21 capsule 0  . Clindamycin-Benzoyl Per, Refr, gel Apply to face once daily in the morning.    . Famotidine (PEPCID AC PO) Take by mouth daily.    . lidocaine-prilocaine (EMLA) cream Apply to affected  area once 30 [name] 3  . LORazepam (ATIVAN) 0.5 MG tablet Take 0.5 mg by mouth at bedtime.    . naproxen sodium (ANAPROX) 220 MG tablet Take 220 mg by mouth as needed.    . spironolactone (ALDACTONE) 100 MG tablet Take 100 mg by mouth daily.    . Sulfacetamide Sodium-Sulfur (CLENIA FOAMING WASH) 10-5 % EMUL Apply topically.    . tretinoin (RETIN-A) 0.025 % gel Apply to face nightly     No current facility-administered medications for this visit.     PHYSICAL EXAMINATION: ECOG PERFORMANCE STATUS: 1 - Symptomatic but completely ambulatory  Vitals:   04/05/18 1426  BP: 93/76  Pulse: 67  Resp: 19  Temp: 98 F (36.7 C)  SpO2: 100%   Filed Weights   04/05/18 1426  Weight: 167 lb 3.2 oz (75.8 kg)    GENERAL:alert, no distress and comfortable SKIN: skin color, texture, turgor are normal, no rashes or significant lesions EYES: normal, Conjunctiva are pink and non-injected, sclera clear OROPHARYNX:no exudate, no erythema and lips, buccal mucosa, and tongue normal  NECK: supple, thyroid normal size, non-tender, without nodularity LYMPH:  no palpable lymphadenopathy in the cervical, axillary or inguinal LUNGS: clear to auscultation and percussion with normal breathing effort HEART: regular rate & rhythm and no murmurs and no lower extremity edema ABDOMEN:abdomen soft, non-tender and normal bowel sounds MUSCULOSKELETAL:no cyanosis of digits and no clubbing  NEURO: alert & oriented x 3 with fluent speech, no focal motor/sensory deficits EXTREMITIES: No lower extremity edema BREAST: No palpable masses or nodules in either right or left breasts. No palpable axillary supraclavicular or infraclavicular adenopathy no breast tenderness or nipple discharge. (exam performed in the presence of a chaperone)  LABORATORY DATA:  I have reviewed the data as listed CMP Latest Ref Rng & Units 03/15/2018 02/01/2018 12/21/2017  Glucose 70 - 140 mg/dL 98 110 164(H)  BUN 7 - 26 mg/dL 13 12 11  Creatinine 0.60 -  1.10 mg/dL 0.98 0.94 0.89  Sodium 136 - 145 mmol/L 141 140 140  Potassium 3.5 - 5.1 mmol/L 3.8 3.8 3.8  Chloride 98 - 109 mmol/L 108 106 107  CO2 22 - 29 mmol/L 25 27 24  Calcium 8.4 - 10.4 mg/dL 9.3 9.7 9.4  Total Protein 6.4 - 8.3 [name]/dL 6.8 7.3 7.2  Total Bilirubin 0.2 - 1.2 mg/dL <0.2(L) <0.2(L) 0.3  Alkaline Phos 40 - 150 U/L 62 59 58  AST 5 - 34 U/L 12 13 15  ALT 0 - 55 U/L 13 11 21    Lab Results  Component Value Date   WBC 3.7 (L) 04/05/2018   HGB 12.2 04/05/2018   HCT 37.2 04/05/2018   MCV 97.5 04/05/2018   PLT 197 04/05/2018   NEUTROABS 1.6 04/05/2018    ASSESSMENT & PLAN:  Malignant neoplasm of upper-outer quadrant of right breast in female, estrogen receptor positive (HCC) Right lumpectomy: IDC with DCIS, 1.4 cm, grade 3, DCIS focally involving posterior margin, 0/3 lymph nodes negative, ER 90%, PR 0%, HER-2 positive ratio 2.6, Ki-67 40% T1 CN 0 stage IA Tumor board did not recommend surgery for the positive posterior margin issue  Recommendation: 1. Adjuvant chemotherapy with TCH followed by Herceptin maintenance for a year started 09/07/2018-12/21/2017  3. Adjuvant radiation   started 01/19/2018 4. Followed by adjuvant antiestrogen therapy with tamoxifen that was started prior to surgery -------------------------------------------------------------------- Treatment plan: Continue maintenance Herceptin Herceptin toxicities: None Patient went on a cruise trip and had a great time in the [place].  Neutropenia: Probably due to radiation therapy ANC 1000.  Return to clinic every 3 weeks for Herceptin and every 6 weeks for follow-up with me with labs.  Herceptin will likely be to be completed end of September or October 2019    No orders of the defined types were placed in this encounter.  The patient has a good understanding of the overall plan. she agrees with it. she will call with any problems that may develop before the next visit here.   Viinay K ,  MD 04/05/18    

## 2018-04-15 ENCOUNTER — Other Ambulatory Visit: Payer: Self-pay

## 2018-04-15 ENCOUNTER — Encounter (HOSPITAL_BASED_OUTPATIENT_CLINIC_OR_DEPARTMENT_OTHER): Payer: Self-pay | Admitting: *Deleted

## 2018-04-15 ENCOUNTER — Ambulatory Visit: Payer: Self-pay | Admitting: Surgery

## 2018-04-15 DIAGNOSIS — C50911 Malignant neoplasm of unspecified site of right female breast: Secondary | ICD-10-CM | POA: Diagnosis not present

## 2018-04-15 NOTE — Pre-Procedure Instructions (Signed)
Pt could not come today for urine pregnancy, explained to pt will be done before surgery.

## 2018-04-15 NOTE — H&P (View-Only) (Signed)
  History of Present Illness Brianna [name]. Tnia Anglada MD; 04/15/2018 11:49 AM) The patient is a 37 year old female who presents for wound check. This is a 37 yo female who is status post right seed localized lumpectomy and right axillary sentinel lymph node biopsy as well as left subclavian vein port placement on 08/11/17. We performed a lumpectomy and sentinel lymph node biopsy in the same axillary incision. Her preoperative diagnosis was a 1.1 cm invasive ductal carcinoma ER 90% PR 0 Ki-67 40% HER-2 positive. Pathology showed that all 3 sentinel lymph nodes were negative. Pathology on the lumpectomy showed 1.4 cm invasive ductal carcinoma with surrounding DCIS. The DCIS focally involved posterior margin. Other margins are negative.  The posterior margin is pectoralis muscle. There is no remaining breast tissue posteriorly.  The patient is doing quite well. The soreness at the edge of her pectoralis has resolved. She feels like she barely even had surgery. Her port site does not seem bothering her. She has completed chemotherapy and radiation therapy. She will continue Herceptin for a year.  During chemotherapy, the edges of her axillary incision were separated with some minimal drainage. This became worse during radiation therapy. There remains a firm area underneath the incision that is occasionally tender and occasionally drains through a small pinhole.   Allergies Sabino Gasser; 04/15/2018 9:18 AM) No Known Drug Allergies [08/20/2017]: Allergies Reconciled  Medication History Sabino Gasser; 04/15/2018 9:18 AM) Dexamethasone ('4MG'$  Tablet, Oral) Active. Prochlorperazine Maleate ('10MG'$  Tablet, Oral) Active. LORazepam (0.'5MG'$  Tablet, Oral) Active. Spironolactone ('100MG'$  Tablet, Oral) Active. Medications Reconciled    Vitals Sabino Gasser; 04/15/2018 9:18 AM) 04/15/2018 9:18 AM Weight: 167.38 lb Height: 63in Body Surface Area: 1.79 m Body Mass Index: 29.65 kg/m  Temp.:  98.76F(Oral)  Pulse: 78 (Regular)  BP: 124/82 (Sitting, Left Arm, Standard)      Physical Exam Rodman Key K. Jaki Hammerschmidt MD; 04/15/2018 11:50 AM)  The physical exam findings are as follows: Note:WDWN in NAD Eyes: Pupils equal, round; sclera anicteric HENT: Oral mucosa moist; good dentition Right axilla - incision is intact, but the center 1.5 cm is thickened. No current drainage. No obvious erythema Neck: No masses palpated, no thyromegaly Lungs: CTA bilaterally; normal respiratory effort CV: Regular rate and rhythm; no murmurs; extremities well-perfused with no edema Abd: +bowel sounds, soft, non-tender, no palpable organomegaly; no palpable hernias Skin: Warm, dry; no sign of jaundice Psychiatric - alert and oriented x 4; calm mood and affect    Assessment & Plan Rodman Key K. Zavion Sleight MD; 04/15/2018 11:51 AM)  INVASIVE DUCTAL CARCINOMA OF RIGHT BREAST IN FEMALE (C50.911)  Current Plans Schedule for Surgery - Right axillary wound exploration. The surgical procedure has been discussed with the patient. Potential risks, benefits, alternative treatments, and expected outcomes have been explained. All of the patient's questions at this time have been answered. The likelihood of reaching the patient's treatment goal is good. The patient understand the proposed surgical procedure and wishes to proceed. Note:It appears that the patient has a small loculated collection of either lymphatic fluid or localized infection that intermittently drains through a small pinhole. I believe that she would benefit from excision of the scar and the underlying granulation tissue with primary closure of healthy appearing adipose tissue and skin.  Brianna [name]. Georgette Dover, MD, [facility]  General/ Trauma Surgery  04/15/2018 11:51 AM

## 2018-04-15 NOTE — H&P (Signed)
  History of Present Illness Brianna [name]. Goldie Dimmer MD; 04/15/2018 11:49 AM) The patient is a 37 year old female who presents for wound check. This is a 37 yo female who is status post right seed localized lumpectomy and right axillary sentinel lymph node biopsy as well as left subclavian vein port placement on 08/11/17. We performed a lumpectomy and sentinel lymph node biopsy in the same axillary incision. Her preoperative diagnosis was a 1.1 cm invasive ductal carcinoma ER 90% PR 0 Ki-67 40% HER-2 positive. Pathology showed that all 3 sentinel lymph nodes were negative. Pathology on the lumpectomy showed 1.4 cm invasive ductal carcinoma with surrounding DCIS. The DCIS focally involved posterior margin. Other margins are negative.  The posterior margin is pectoralis muscle. There is no remaining breast tissue posteriorly.  The patient is doing quite well. The soreness at the edge of her pectoralis has resolved. She feels like she barely even had surgery. Her port site does not seem bothering her. She has completed chemotherapy and radiation therapy. She will continue Herceptin for a year.  During chemotherapy, the edges of her axillary incision were separated with some minimal drainage. This became worse during radiation therapy. There remains a firm area underneath the incision that is occasionally tender and occasionally drains through a small pinhole.   Allergies Sabino Gasser; 04/15/2018 9:18 AM) No Known Drug Allergies [08/20/2017]: Allergies Reconciled  Medication History Sabino Gasser; 04/15/2018 9:18 AM) Dexamethasone ('4MG'$  Tablet, Oral) Active. Prochlorperazine Maleate ('10MG'$  Tablet, Oral) Active. LORazepam (0.'5MG'$  Tablet, Oral) Active. Spironolactone ('100MG'$  Tablet, Oral) Active. Medications Reconciled    Vitals Sabino Gasser; 04/15/2018 9:18 AM) 04/15/2018 9:18 AM Weight: 167.38 lb Height: 63in Body Surface Area: 1.79 m Body Mass Index: 29.65 kg/m  Temp.:  98.65F(Oral)  Pulse: 78 (Regular)  BP: 124/82 (Sitting, Left Arm, Standard)      Physical Exam Rodman Key K. Ediberto Sens MD; 04/15/2018 11:50 AM)  The physical exam findings are as follows: Note:WDWN in NAD Eyes: Pupils equal, round; sclera anicteric HENT: Oral mucosa moist; good dentition Right axilla - incision is intact, but the center 1.5 cm is thickened. No current drainage. No obvious erythema Neck: No masses palpated, no thyromegaly Lungs: CTA bilaterally; normal respiratory effort CV: Regular rate and rhythm; no murmurs; extremities well-perfused with no edema Abd: +bowel sounds, soft, non-tender, no palpable organomegaly; no palpable hernias Skin: Warm, dry; no sign of jaundice Psychiatric - alert and oriented x 4; calm mood and affect    Assessment & Plan Rodman Key K. Sidhant Helderman MD; 04/15/2018 11:51 AM)  INVASIVE DUCTAL CARCINOMA OF RIGHT BREAST IN FEMALE (C50.911)  Current Plans Schedule for Surgery - Right axillary wound exploration. The surgical procedure has been discussed with the patient. Potential risks, benefits, alternative treatments, and expected outcomes have been explained. All of the patient's questions at this time have been answered. The likelihood of reaching the patient's treatment goal is good. The patient understand the proposed surgical procedure and wishes to proceed. Note:It appears that the patient has a small loculated collection of either lymphatic fluid or localized infection that intermittently drains through a small pinhole. I believe that she would benefit from excision of the scar and the underlying granulation tissue with primary closure of healthy appearing adipose tissue and skin.  Brianna [name]. Georgette Dover, MD, [facility]  General/ Trauma Surgery  04/15/2018 11:51 AM

## 2018-04-19 ENCOUNTER — Encounter (HOSPITAL_BASED_OUTPATIENT_CLINIC_OR_DEPARTMENT_OTHER): Payer: Self-pay | Admitting: Emergency Medicine

## 2018-04-19 ENCOUNTER — Encounter (HOSPITAL_BASED_OUTPATIENT_CLINIC_OR_DEPARTMENT_OTHER)
Admission: RE | Disposition: A | Discharge status: Home / Self Care | Payer: Self-pay | Source: Ambulatory Visit | Attending: Surgery

## 2018-04-19 ENCOUNTER — Ambulatory Visit (HOSPITAL_BASED_OUTPATIENT_CLINIC_OR_DEPARTMENT_OTHER)
Admission: RE | Admit: 2018-04-19 | Discharge: 2018-04-19 | Disposition: A | Discharge status: Home / Self Care | Payer: 59 | Source: Ambulatory Visit | Attending: Surgery | Admitting: Surgery

## 2018-04-19 ENCOUNTER — Other Ambulatory Visit: Payer: Self-pay

## 2018-04-19 ENCOUNTER — Ambulatory Visit (HOSPITAL_BASED_OUTPATIENT_CLINIC_OR_DEPARTMENT_OTHER): Payer: 59 | Admitting: Anesthesiology

## 2018-04-19 DIAGNOSIS — L905 Scar conditions and fibrosis of skin: Secondary | ICD-10-CM | POA: Diagnosis not present

## 2018-04-19 DIAGNOSIS — Z9221 Personal history of antineoplastic chemotherapy: Secondary | ICD-10-CM | POA: Diagnosis not present

## 2018-04-19 DIAGNOSIS — Z853 Personal history of malignant neoplasm of breast: Secondary | ICD-10-CM | POA: Diagnosis not present

## 2018-04-19 DIAGNOSIS — Z7952 Long term (current) use of systemic steroids: Secondary | ICD-10-CM | POA: Insufficient documentation

## 2018-04-19 DIAGNOSIS — Z923 Personal history of irradiation: Secondary | ICD-10-CM | POA: Diagnosis not present

## 2018-04-19 DIAGNOSIS — Y838 Other surgical procedures as the cause of abnormal reaction of the patient, or of later complication, without mention of misadventure at the time of the procedure: Secondary | ICD-10-CM | POA: Diagnosis not present

## 2018-04-19 DIAGNOSIS — S41101A Unspecified open wound of right upper arm, initial encounter: Secondary | ICD-10-CM | POA: Diagnosis not present

## 2018-04-19 DIAGNOSIS — T8189XA Other complications of procedures, not elsewhere classified, initial encounter: Secondary | ICD-10-CM | POA: Diagnosis not present

## 2018-04-19 DIAGNOSIS — Z79899 Other long term (current) drug therapy: Secondary | ICD-10-CM | POA: Insufficient documentation

## 2018-04-19 DIAGNOSIS — F329 Major depressive disorder, single episode, unspecified: Secondary | ICD-10-CM | POA: Insufficient documentation

## 2018-04-19 DIAGNOSIS — Z79811 Long term (current) use of aromatase inhibitors: Secondary | ICD-10-CM | POA: Insufficient documentation

## 2018-04-19 DIAGNOSIS — K580 Irritable bowel syndrome with diarrhea: Secondary | ICD-10-CM | POA: Diagnosis not present

## 2018-04-19 DIAGNOSIS — S21101A Unspecified open wound of right front wall of thorax without penetration into thoracic cavity, initial encounter: Secondary | ICD-10-CM | POA: Diagnosis not present

## 2018-04-19 DIAGNOSIS — T8141XA Infection following a procedure, superficial incisional surgical site, initial encounter: Secondary | ICD-10-CM | POA: Diagnosis present

## 2018-04-19 DIAGNOSIS — L923 Foreign body granuloma of the skin and subcutaneous tissue: Secondary | ICD-10-CM | POA: Diagnosis not present

## 2018-04-19 HISTORY — DX: Depression, unspecified: F32.A

## 2018-04-19 HISTORY — PX: WOUND EXPLORATION: SHX6188

## 2018-04-19 HISTORY — DX: Major depressive disorder, single episode, unspecified: F32.9

## 2018-04-19 LAB — POCT PREGNANCY, URINE: PREG TEST UR: NEGATIVE

## 2018-04-19 SURGERY — WOUND EXPLORATION
Anesthesia: General | Site: Axilla | Laterality: Right

## 2018-04-19 MED ORDER — CEFAZOLIN SODIUM-DEXTROSE 2-4 GM/100ML-% IV SOLN
2.0000 g | INTRAVENOUS | Status: AC
  Administered 2018-04-19: 2 g via INTRAVENOUS

## 2018-04-19 MED ORDER — CHLORHEXIDINE GLUCONATE CLOTH 2 % EX PADS: 6.0000 | MEDICATED_PAD | Freq: Once | CUTANEOUS | Status: DC

## 2018-04-19 MED ORDER — BUPIVACAINE-EPINEPHRINE 0.25% -1:200000 IJ SOLN
INTRAMUSCULAR | Status: DC | PRN
  Administered 2018-04-19: 10 mL

## 2018-04-19 MED ORDER — MIDAZOLAM HCL 2 MG/2ML IJ SOLN
INTRAMUSCULAR | Status: DC | PRN
  Administered 2018-04-19: 2 mg via INTRAVENOUS

## 2018-04-19 MED ORDER — FENTANYL CITRATE (PF) 100 MCG/2ML IJ SOLN
INTRAMUSCULAR | Status: AC
  Filled 2018-04-19: qty 2

## 2018-04-19 MED ORDER — LACTATED RINGERS IV SOLN
INTRAVENOUS | Status: DC
  Administered 2018-04-19: 10:00:00 via INTRAVENOUS

## 2018-04-19 MED ORDER — 0.9 % SODIUM CHLORIDE (POUR BTL) OPTIME
TOPICAL | Status: DC | PRN
  Administered 2018-04-19: 120 mL

## 2018-04-19 MED ORDER — FENTANYL CITRATE (PF) 100 MCG/2ML IJ SOLN: 50.0000 ug | INTRAMUSCULAR | Status: DC | PRN

## 2018-04-19 MED ORDER — MIDAZOLAM HCL 2 MG/2ML IJ SOLN: 1.0000 mg | INTRAMUSCULAR | Status: DC | PRN

## 2018-04-19 MED ORDER — ONDANSETRON HCL 4 MG/2ML IJ SOLN
INTRAMUSCULAR | Status: DC | PRN
  Administered 2018-04-19: 4 mg via INTRAVENOUS

## 2018-04-19 MED ORDER — MIDAZOLAM HCL 2 MG/2ML IJ SOLN
INTRAMUSCULAR | Status: AC
  Filled 2018-04-19: qty 2

## 2018-04-19 MED ORDER — PROPOFOL 10 MG/ML IV BOLUS
INTRAVENOUS | Status: AC
  Filled 2018-04-19: qty 20

## 2018-04-19 MED ORDER — CEFAZOLIN SODIUM-DEXTROSE 2-3 GM-%(50ML) IV SOLR
INTRAVENOUS | Status: DC | PRN
  Administered 2018-04-19: 2 g via INTRAVENOUS

## 2018-04-19 MED ORDER — HYDROMORPHONE HCL 1 MG/ML IJ SOLN: 0.2500 mg | INTRAMUSCULAR | Status: DC | PRN

## 2018-04-19 MED ORDER — LIDOCAINE HCL (CARDIAC) PF 100 MG/5ML IV SOSY
PREFILLED_SYRINGE | INTRAVENOUS | Status: DC | PRN
  Administered 2018-04-19: 60 mg via INTRAVENOUS

## 2018-04-19 MED ORDER — LIDOCAINE HCL (CARDIAC) PF 100 MG/5ML IV SOSY
PREFILLED_SYRINGE | INTRAVENOUS | Status: AC
  Filled 2018-04-19: qty 5

## 2018-04-19 MED ORDER — CEFAZOLIN SODIUM-DEXTROSE 2-4 GM/100ML-% IV SOLN
INTRAVENOUS | Status: AC
Start: 2018-04-19 — End: ?
  Filled 2018-04-19: qty 100

## 2018-04-19 MED ORDER — SCOPOLAMINE 1 MG/3DAYS TD PT72: 1.0000 | MEDICATED_PATCH | Freq: Once | TRANSDERMAL | Status: DC | PRN

## 2018-04-19 MED ORDER — FENTANYL CITRATE (PF) 100 MCG/2ML IJ SOLN
INTRAMUSCULAR | Status: DC | PRN
  Administered 2018-04-19 (×3): 25 ug via INTRAVENOUS

## 2018-04-19 MED ORDER — PROPOFOL 10 MG/ML IV BOLUS
INTRAVENOUS | Status: DC | PRN
  Administered 2018-04-19: 150 mg via INTRAVENOUS

## 2018-04-19 MED ORDER — ONDANSETRON HCL 4 MG/2ML IJ SOLN
INTRAMUSCULAR | Status: AC
Start: 2018-04-19 — End: ?
  Filled 2018-04-19: qty 2

## 2018-04-19 MED ORDER — ONDANSETRON HCL 4 MG/2ML IJ SOLN: 4.0000 mg | Freq: Once | INTRAMUSCULAR | Status: DC | PRN

## 2018-04-19 MED ORDER — MEPERIDINE HCL 25 MG/ML IJ SOLN: 6.2500 mg | INTRAMUSCULAR | Status: DC | PRN

## 2018-04-19 SURGICAL SUPPLY — 40 items
BENZOIN TINCTURE PRP APPL 2/3 (GAUZE/BANDAGES/DRESSINGS) IMPLANT
BLADE HEX COATED 2.75 (ELECTRODE) ×3 IMPLANT
BLADE SURG 15 STRL LF DISP TIS (BLADE) ×1 IMPLANT
BLADE SURG 15 STRL SS (BLADE) ×2
CANISTER SUCT 1200ML W/VALVE (MISCELLANEOUS) ×3 IMPLANT
CHLORAPREP W/TINT 26ML (MISCELLANEOUS) ×3 IMPLANT
CLOSURE WOUND 1/2 X4 (GAUZE/BANDAGES/DRESSINGS)
COVER BACK TABLE 60X90IN (DRAPES) ×3 IMPLANT
COVER MAYO STAND STRL (DRAPES) ×3 IMPLANT
DECANTER SPIKE VIAL GLASS SM (MISCELLANEOUS) ×3 IMPLANT
DRAPE LAPAROTOMY 100X72 PEDS (DRAPES) ×3 IMPLANT
DRAPE UTILITY XL STRL (DRAPES) ×3 IMPLANT
DRSG TEGADERM 4X4.75 (GAUZE/BANDAGES/DRESSINGS) IMPLANT
ELECT REM PT RETURN 9FT ADLT (ELECTROSURGICAL) ×3
ELECTRODE REM PT RTRN 9FT ADLT (ELECTROSURGICAL) ×1 IMPLANT
GAUZE SPONGE 4X4 12PLY STRL LF (GAUZE/BANDAGES/DRESSINGS) IMPLANT
GAUZE XEROFORM 1X8 LF (GAUZE/BANDAGES/DRESSINGS) IMPLANT
GLOVE BIO SURGEON STRL SZ7 (GLOVE) ×3 IMPLANT
GLOVE BIOGEL PI IND STRL 7.5 (GLOVE) ×1 IMPLANT
GLOVE BIOGEL PI INDICATOR 7.5 (GLOVE) ×2
GOWN STRL REUS W/ TWL LRG LVL3 (GOWN DISPOSABLE) ×2 IMPLANT
GOWN STRL REUS W/TWL LRG LVL3 (GOWN DISPOSABLE) ×4
NEEDLE HYPO 25X1 1.5 SAFETY (NEEDLE) ×3 IMPLANT
NS IRRIG 1000ML POUR BTL (IV SOLUTION) IMPLANT
PACK BASIN DAY SURGERY FS (CUSTOM PROCEDURE TRAY) ×3 IMPLANT
PENCIL BUTTON HOLSTER BLD 10FT (ELECTRODE) ×3 IMPLANT
SLEEVE SCD COMPRESS KNEE MED (MISCELLANEOUS) ×3 IMPLANT
SPONGE LAP 4X18 RFD (DISPOSABLE) ×3 IMPLANT
STRIP CLOSURE SKIN 1/2X4 (GAUZE/BANDAGES/DRESSINGS) IMPLANT
SUT ETHILON 3 0 PS 1 (SUTURE) IMPLANT
SUT MON AB 4-0 PC3 18 (SUTURE) IMPLANT
SUT VIC AB 3-0 SH 27 (SUTURE) ×2
SUT VIC AB 3-0 SH 27X BRD (SUTURE) ×1 IMPLANT
SYR BULB 3OZ (MISCELLANEOUS) ×3 IMPLANT
SYR CONTROL 10ML LL (SYRINGE) ×3 IMPLANT
TOWEL OR 17X24 6PK STRL BLUE (TOWEL DISPOSABLE) ×3 IMPLANT
TOWEL OR NON WOVEN STRL DISP B (DISPOSABLE) ×3 IMPLANT
TUBE CONNECTING 20'X1/4 (TUBING) ×1
TUBE CONNECTING 20X1/4 (TUBING) ×2 IMPLANT
YANKAUER SUCT BULB TIP NO VENT (SUCTIONS) ×3 IMPLANT

## 2018-04-19 NOTE — Anesthesia Preprocedure Evaluation (Signed)
Anesthesia Evaluation  Patient identified by MRN, date of birth, ID band Patient awake    Reviewed: Allergy & Precautions, NPO status , Patient's Chart, lab work & pertinent test results  Airway Mallampati: I  TM Distance: >3 FB Neck ROM: Full    Dental   Pulmonary    Pulmonary exam normal        Cardiovascular Normal cardiovascular exam     Neuro/Psych Depression    GI/Hepatic GERD  Medicated and Controlled,  Endo/Other    Renal/GU      Musculoskeletal   Abdominal   Peds  Hematology   Anesthesia Other Findings   Reproductive/Obstetrics                             Anesthesia Physical Anesthesia Plan  ASA: II  Anesthesia Plan: General   Post-op Pain Management:    Induction: Intravenous  PONV Risk Score and Plan: 3 and Ondansetron, Midazolam and Treatment may vary due to age or medical condition  Airway Management Planned: LMA  Additional Equipment:   Intra-op Plan:   Post-operative Plan: Extubation in OR  Informed Consent: I have reviewed the patients History and Physical, chart, labs and discussed the procedure including the risks, benefits and alternatives for the proposed anesthesia with the patient or authorized representative who has indicated his/her understanding and acceptance.     Plan Discussed with: CRNA and Surgeon  Anesthesia Plan Comments:         Anesthesia Quick Evaluation

## 2018-04-19 NOTE — Discharge Instructions (Signed)
Post Anesthesia Home Care Instructions  Activity: Get plenty of rest for the remainder of the day. A responsible individual must stay with you for 24 hours following the procedure.  For the next 24 hours, DO NOT: -Drive a car -Paediatric nurse -Drink alcoholic beverages -Take any medication unless instructed by your physician -Make any legal decisions or sign important papers.  Meals: Start with liquid foods such as gelatin or soup. Progress to regular foods as tolerated. Avoid greasy, spicy, heavy foods. If nausea and/or vomiting occur, drink only clear liquids until the nausea and/or vomiting subsides. Call your physician if vomiting continues.  Special Instructions/Symptoms: Your throat may feel dry or sore from the anesthesia or the breathing tube placed in your throat during surgery. If this causes discomfort, gargle with warm salt water. The discomfort should disappear within 24 hours.  If you had a scopolamine patch placed behind your ear for the management of post- operative nausea and/or vomiting:  1. The medication in the patch is effective for 72 hours, after which it should be removed.  Wrap patch in a tissue and discard in the trash. Wash hands thoroughly with soap and water. 2. You may remove the patch earlier than 72 hours if you experience unpleasant side effects which may include dry mouth, dizziness or visual disturbances. 3. Avoid touching the patch. Wash your hands with soap and water after contact with the patch.     [place] Office Phone Number [phone] POST OP INSTRUCTIONS  Always review your discharge instruction sheet given to you by the facility where your surgery was performed.  IF YOU HAVE DISABILITY OR FAMILY LEAVE FORMS, YOU MUST BRING THEM TO THE OFFICE FOR PROCESSING.  DO NOT GIVE THEM TO YOUR DOCTOR.  1. Take your usually prescribed medications unless otherwise directed 2. You should eat very light the first 24 hours after  surgery, such as soup, crackers, pudding, etc.  Resume your normal diet the day after surgery. 3. Most patients will experience some swelling and bruising around the surgical site.  Ice packs will help.  Swelling and bruising can take several days to resolve.  4. It is common to experience some constipation if taking pain medication after surgery.  Increasing fluid intake and taking a stool softener will usually help or prevent this problem from occurring.  A mild laxative (Milk of Magnesia or Miralax) should be taken according to package directions if there are no bowel movements after 48 hours. 5. You may remove your bandages 48 hours after surgery, and you may shower at that time.  You will have steri-strips (small skin tapes) in place directly over the incision.  These strips should be left on the skin for 7-10 days.   6. ACTIVITIES:  You may resume regular daily activities (gradually increasing) beginning the next day.   You may have sexual intercourse when it is comfortable. a. You may drive when you no longer are taking prescription pain medication, you can comfortably wear a seatbelt, and you can safely maneuver your car and apply brakes. b. RETURN TO WORK:  1-2 weeks 7. You should see your doctor in the office for a follow-up appointment approximately two to three weeks after your surgery.    WHEN TO CALL YOUR DOCTOR: 1. Fever over 101.0 2. Nausea and/or vomiting. 3. Extreme swelling or bruising. 4. Continued bleeding from incision. 5. Increased pain, redness, or drainage from the incision.  The clinic staff is available to answer your questions during regular business  hours.  Please dont hesitate to call and ask to speak to one of the nurses for clinical concerns.  If you have a medical emergency, go to the nearest emergency room or call 911.  A surgeon from [facility] is always on call at the hospital.  For further questions, please visit [email or web address]

## 2018-04-19 NOTE — Op Note (Signed)
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  She received chemotherapy and radiation.  During her adjuvant treatment, she developed intermittent swelling and drainage from her right axillary incision.  This would heal and then after a few weeks would flareup and begin draining through a small pinhole.  Currently there is no drainage but there still is some thickening of the midportion of the incision.  I recommended complete excision and revision of the wound.  Description of procedure: The patient is brought to the operating room and placed in the supine position on the operating room table.  After adequate level of general anesthesia was obtained, her right axilla was prepped with ChloraPrep and draped in sterile fashion.  A timeout was taken to ensure the proper patient and proper procedure.  We infiltrated the area around the previous incision with 0.25% Marcaine with epinephrine.  I made an elliptical incision around the previous scar.  We dissected down into the subcutaneous tissues with cautery.  We excised the old scar tissue from this area.  There is no sign of infection.  The old scar was sent for pathologic examination.  We did not find any abscess cavity or deep tunnel.  We irrigated the wound thoroughly and inspected for hemostasis.  We closed with a deep layer of 3-0 Vicryl and a subcuticular layer 4-0 Monocryl.  Benzoin Steri-Strips were applied.  Brianna [name]. Brianna Dover, MD, [facility]  General/ Trauma Surgery  04/19/2018 1:17 PM

## 2018-04-19 NOTE — Anesthesia Postprocedure Evaluation (Signed)
Anesthesia Post Note  Patient: Brianna [name]  Procedure(s) Performed: RIGHT AXILLARY WOUND EXPLORATION (Right Axilla)     Patient location during evaluation: PACU Anesthesia Type: General Level of consciousness: awake and alert Pain management: pain level controlled Vital Signs Assessment: post-procedure vital signs reviewed and stable Respiratory status: spontaneous breathing, nonlabored ventilation, respiratory function stable and patient connected to nasal cannula oxygen Cardiovascular status: blood pressure returned to baseline and stable Postop Assessment: no apparent nausea or vomiting Anesthetic complications: no    Last Vitals:  Vitals:   04/19/18 1345 04/19/18 1420  BP: 96/66 108/75  Pulse: (!) 57 (!) 58  Resp: 17 16  Temp:  36.6 C  SpO2: 100% 100%    Last Pain:  Vitals:   04/19/18 1420  TempSrc: Oral  PainSc: 0-No pain                 Yaroslav Gombos DAVID

## 2018-04-19 NOTE — Anesthesia Procedure Notes (Signed)
Procedure Name: LMA Insertion Date/Time: 04/19/2018 12:41 PM Performed by: Raenette Rover, CRNA Pre-anesthesia Checklist: Patient identified, Emergency Drugs available, Suction available and Patient being monitored Patient Re-evaluated:Patient Re-evaluated prior to induction Oxygen Delivery Method: Circle system utilized Preoxygenation: Pre-oxygenation with 100% oxygen Induction Type: IV induction LMA: LMA inserted LMA Size: 4.0 Number of attempts: 1 Placement Confirmation: positive ETCO2,  CO2 detector and breath sounds checked- equal and bilateral Tube secured with: Tape Dental Injury: Teeth and Oropharynx as per pre-operative assessment

## 2018-04-19 NOTE — Transfer of Care (Signed)
Immediate Anesthesia Transfer of Care Note  Patient: Brianna [name]  Procedure(s) Performed: RIGHT AXILLARY WOUND EXPLORATION (Right Axilla)  Patient Location: PACU  Anesthesia Type:General  Level of Consciousness: awake, alert , oriented and patient cooperative  Airway & Oxygen Therapy: Patient Spontanous Breathing and Patient connected to face mask oxygen  Post-op Assessment: Report given to RN and Post -op Vital signs reviewed and stable  Post vital signs: Reviewed and stable  Last Vitals:  Vitals Value Taken Time  BP 107/75 04/19/2018  1:15 PM  Temp    Pulse 80 04/19/2018  1:16 PM  Resp 20 04/19/2018  1:16 PM  SpO2 100 % 04/19/2018  1:16 PM  Vitals shown include unvalidated device data.  Last Pain:  Vitals:   04/19/18 0933  TempSrc: Oral  PainSc: 0-No pain         Complications: No apparent anesthesia complications

## 2018-04-19 NOTE — Interval H&P Note (Signed)
History and Physical Interval Note:  04/19/2018 9:36 AM  Brianna [name]  has presented today for surgery, with the diagnosis of Chronic right axillary wound  The various methods of treatment have been discussed with the patient and family. After consideration of risks, benefits and other options for treatment, the patient has consented to  Procedure(s): RIGHT AXILLARY WOUND EXPLORATION (Right) as a surgical intervention .  The patient's history has been reviewed, patient examined, no change in status, stable for surgery.  I have reviewed the patient's chart and labs.  Questions were answered to the patient's satisfaction.     Maia Petties

## 2018-04-20 ENCOUNTER — Encounter (HOSPITAL_BASED_OUTPATIENT_CLINIC_OR_DEPARTMENT_OTHER): Payer: Self-pay | Admitting: Surgery

## 2018-04-26 ENCOUNTER — Inpatient Hospital Stay: Payer: 59 | Attending: Hematology and Oncology

## 2018-04-26 ENCOUNTER — Inpatient Hospital Stay: Payer: 59

## 2018-04-26 VITALS — BP 109/71 | HR 81 | Temp 98.0°F | Resp 16 | Wt 166.5 lb

## 2018-04-26 DIAGNOSIS — Z5112 Encounter for antineoplastic immunotherapy: Secondary | ICD-10-CM | POA: Insufficient documentation

## 2018-04-26 DIAGNOSIS — Z170 Estrogen receptor positive status [ER+]: Principal | ICD-10-CM

## 2018-04-26 DIAGNOSIS — C50411 Malignant neoplasm of upper-outer quadrant of right female breast: Secondary | ICD-10-CM | POA: Diagnosis not present

## 2018-04-26 DIAGNOSIS — Z95828 Presence of other vascular implants and grafts: Secondary | ICD-10-CM

## 2018-04-26 LAB — CBC WITH DIFFERENTIAL/PLATELET
BASOS PCT: 0 %
Basophils Absolute: 0 10*3/uL (ref 0.0–0.1)
Eosinophils Absolute: 0.1 10*3/uL (ref 0.0–0.5)
Eosinophils Relative: 1 %
HCT: 37.9 % (ref 34.8–46.6)
Hemoglobin: 12.7 g/dL (ref 11.6–15.9)
LYMPHS ABS: 2.2 10*3/uL (ref 0.9–3.3)
Lymphocytes Relative: 52 %
MCH: 31.6 pg (ref 25.1–34.0)
MCHC: 33.5 g/dL (ref 31.5–36.0)
MCV: 94.3 fL (ref 79.5–101.0)
MONOS PCT: 5 %
Monocytes Absolute: 0.2 10*3/uL (ref 0.1–0.9)
NEUTROS ABS: 1.7 10*3/uL (ref 1.5–6.5)
NEUTROS PCT: 42 %
Platelets: 228 10*3/uL (ref 145–400)
RBC: 4.02 MIL/uL (ref 3.70–5.45)
RDW: 13.7 % (ref 11.2–14.5)
WBC: 4.2 10*3/uL (ref 3.9–10.3)

## 2018-04-26 LAB — COMPREHENSIVE METABOLIC PANEL
ALT: 10 U/L (ref 0–55)
AST: 16 U/L (ref 5–34)
Albumin: 4 g/dL (ref 3.5–5.0)
Alkaline Phosphatase: 74 U/L (ref 40–150)
Anion gap: 8 (ref 3–11)
BUN: 11 mg/dL (ref 7–26)
CHLORIDE: 104 mmol/L (ref 98–109)
CO2: 26 mmol/L (ref 22–29)
CREATININE: 0.88 mg/dL (ref 0.60–1.10)
Calcium: 9.4 mg/dL (ref 8.4–10.4)
Glucose, Bld: 120 mg/dL (ref 70–140)
POTASSIUM: 3.4 mmol/L — AB (ref 3.5–5.1)
SODIUM: 138 mmol/L (ref 136–145)
Total Bilirubin: 0.3 mg/dL (ref 0.2–1.2)
Total Protein: 7.6 g/dL (ref 6.4–8.3)

## 2018-04-26 MED ORDER — SODIUM CHLORIDE 0.9% FLUSH
10.0000 mL | INTRAVENOUS | Status: DC | PRN
  Administered 2018-04-26: 10 mL via INTRAVENOUS
  Filled 2018-04-26: qty 10

## 2018-04-26 MED ORDER — DIPHENHYDRAMINE HCL 25 MG PO CAPS
50.0000 mg | ORAL_CAPSULE | Freq: Once | ORAL | Status: AC
  Administered 2018-04-26: 50 mg via ORAL

## 2018-04-26 MED ORDER — DIPHENHYDRAMINE HCL 25 MG PO CAPS
ORAL_CAPSULE | ORAL | Status: AC
  Filled 2018-04-26: qty 2

## 2018-04-26 MED ORDER — TRASTUZUMAB CHEMO 150 MG IV SOLR
450.0000 mg | Freq: Once | INTRAVENOUS | Status: AC
  Administered 2018-04-26: 450 mg via INTRAVENOUS
  Filled 2018-04-26: qty 21.43

## 2018-04-26 MED ORDER — ACETAMINOPHEN 325 MG PO TABS
ORAL_TABLET | ORAL | Status: AC
  Filled 2018-04-26: qty 2

## 2018-04-26 MED ORDER — SODIUM CHLORIDE 0.9% FLUSH
10.0000 mL | INTRAVENOUS | Status: DC | PRN
  Administered 2018-04-26: 10 mL
  Filled 2018-04-26: qty 10

## 2018-04-26 MED ORDER — SODIUM CHLORIDE 0.9 % IV SOLN
Freq: Once | INTRAVENOUS | Status: AC
  Administered 2018-04-26: 14:00:00 via INTRAVENOUS

## 2018-04-26 MED ORDER — HEPARIN SOD (PORK) LOCK FLUSH 100 UNIT/ML IV SOLN
500.0000 [IU] | Freq: Once | INTRAVENOUS | Status: AC | PRN
  Administered 2018-04-26: 500 [IU]
  Filled 2018-04-26: qty 5

## 2018-04-26 MED ORDER — ACETAMINOPHEN 325 MG PO TABS
650.0000 mg | ORAL_TABLET | Freq: Once | ORAL | Status: AC
  Administered 2018-04-26: 650 mg via ORAL

## 2018-04-26 NOTE — Patient Instructions (Signed)
[facility] Discharge Instructions for Patients Receiving Chemotherapy  Today you received the following chemotherapy agents Herceptin  To help prevent nausea and vomiting after your treatment, we encourage you to take your nausea medication as directed   If you develop nausea and vomiting that is not controlled by your nausea medication, call the clinic.   BELOW ARE SYMPTOMS THAT SHOULD BE REPORTED IMMEDIATELY:  *FEVER GREATER THAN 100.5 F  *CHILLS WITH OR WITHOUT FEVER  NAUSEA AND VOMITING THAT IS NOT CONTROLLED WITH YOUR NAUSEA MEDICATION  *UNUSUAL SHORTNESS OF BREATH  *UNUSUAL BRUISING OR BLEEDING  TENDERNESS IN MOUTH AND THROAT WITH OR WITHOUT PRESENCE OF ULCERS  *URINARY PROBLEMS  *BOWEL PROBLEMS  UNUSUAL RASH Items with * indicate a potential emergency and should be followed up as soon as possible.  Feel free to call the clinic should you have any questions or concerns. The clinic phone number is [phone].  Please show the CHEMO ALERT CARD at check-in to the Emergency Department and triage nurse.   

## 2018-05-17 ENCOUNTER — Other Ambulatory Visit: Payer: Self-pay

## 2018-05-17 ENCOUNTER — Telehealth: Payer: Self-pay

## 2018-05-17 ENCOUNTER — Inpatient Hospital Stay (HOSPITAL_BASED_OUTPATIENT_CLINIC_OR_DEPARTMENT_OTHER): Payer: 59 | Admitting: Hematology and Oncology

## 2018-05-17 ENCOUNTER — Inpatient Hospital Stay: Payer: 59

## 2018-05-17 ENCOUNTER — Telehealth: Payer: Self-pay | Admitting: Hematology and Oncology

## 2018-05-17 DIAGNOSIS — Z170 Estrogen receptor positive status [ER+]: Secondary | ICD-10-CM

## 2018-05-17 DIAGNOSIS — C50411 Malignant neoplasm of upper-outer quadrant of right female breast: Secondary | ICD-10-CM

## 2018-05-17 DIAGNOSIS — R5383 Other fatigue: Secondary | ICD-10-CM | POA: Diagnosis not present

## 2018-05-17 DIAGNOSIS — Z95828 Presence of other vascular implants and grafts: Secondary | ICD-10-CM

## 2018-05-17 DIAGNOSIS — Z79899 Other long term (current) drug therapy: Principal | ICD-10-CM

## 2018-05-17 DIAGNOSIS — Z5181 Encounter for therapeutic drug level monitoring: Secondary | ICD-10-CM

## 2018-05-17 DIAGNOSIS — Z5112 Encounter for antineoplastic immunotherapy: Secondary | ICD-10-CM | POA: Diagnosis not present

## 2018-05-17 LAB — COMPREHENSIVE METABOLIC PANEL
ALBUMIN: 3.9 g/dL (ref 3.5–5.0)
ALK PHOS: 79 U/L (ref 38–126)
ALT: 16 U/L (ref 0–44)
ANION GAP: 8 (ref 5–15)
AST: 15 U/L (ref 15–41)
BUN: 14 mg/dL (ref 6–20)
CALCIUM: 9.1 mg/dL (ref 8.9–10.3)
CO2: 25 mmol/L (ref 22–32)
CREATININE: 0.91 mg/dL (ref 0.44–1.00)
Chloride: 103 mmol/L (ref 98–111)
GFR calc Af Amer: >60 mL/min (ref >60)
GFR calc non Af Amer: >60 mL/min (ref >60)
GLUCOSE: 155 mg/dL — AB (ref 70–99)
Potassium: 3.7 mmol/L (ref 3.5–5.1)
SODIUM: 136 mmol/L (ref 135–145)
Total Bilirubin: 0.2 mg/dL — ABNORMAL LOW (ref 0.3–1.2)
Total Protein: 7.2 g/dL (ref 6.5–8.1)

## 2018-05-17 LAB — CBC WITH DIFFERENTIAL/PLATELET
BASOS PCT: 0 %
Basophils Absolute: 0 10*3/uL (ref 0.0–0.1)
EOS ABS: 0 10*3/uL (ref 0.0–0.5)
Eosinophils Relative: 1 %
HCT: 37.9 % (ref 34.8–46.6)
HEMOGLOBIN: 12.4 g/dL (ref 11.6–15.9)
LYMPHS ABS: 1.9 10*3/uL (ref 0.9–3.3)
Lymphocytes Relative: 45 %
MCH: 30.8 pg (ref 25.1–34.0)
MCHC: 32.7 g/dL (ref 31.5–36.0)
MCV: 94.2 fL (ref 79.5–101.0)
MONOS PCT: 5 %
Monocytes Absolute: 0.2 10*3/uL (ref 0.1–0.9)
Neutro Abs: 2 10*3/uL (ref 1.5–6.5)
Neutrophils Relative %: 49 %
Platelets: 218 10*3/uL (ref 145–400)
RBC: 4.03 MIL/uL (ref 3.70–5.45)
RDW: 14.9 % — ABNORMAL HIGH (ref 11.2–14.5)
WBC: 4.2 10*3/uL (ref 3.9–10.3)

## 2018-05-17 MED ORDER — SODIUM CHLORIDE 0.9% FLUSH
10.0000 mL | INTRAVENOUS | Status: DC | PRN
  Administered 2018-05-17: 10 mL
  Filled 2018-05-17: qty 10

## 2018-05-17 MED ORDER — SODIUM CHLORIDE 0.9% FLUSH
10.0000 mL | INTRAVENOUS | Status: DC | PRN
  Administered 2018-05-17: 10 mL via INTRAVENOUS
  Filled 2018-05-17: qty 10

## 2018-05-17 MED ORDER — TRASTUZUMAB CHEMO 150 MG IV SOLR
450.0000 mg | Freq: Once | INTRAVENOUS | Status: AC
  Administered 2018-05-17: 450 mg via INTRAVENOUS
  Filled 2018-05-17: qty 21.43

## 2018-05-17 MED ORDER — DIPHENHYDRAMINE HCL 25 MG PO CAPS
ORAL_CAPSULE | ORAL | Status: AC
  Filled 2018-05-17: qty 2

## 2018-05-17 MED ORDER — ACETAMINOPHEN 325 MG PO TABS
650.0000 mg | ORAL_TABLET | Freq: Once | ORAL | Status: AC
  Administered 2018-05-17: 650 mg via ORAL

## 2018-05-17 MED ORDER — SODIUM CHLORIDE 0.9 % IV SOLN
Freq: Once | INTRAVENOUS | Status: AC
  Administered 2018-05-17: 15:00:00 via INTRAVENOUS

## 2018-05-17 MED ORDER — ACETAMINOPHEN 325 MG PO TABS
ORAL_TABLET | ORAL | Status: AC
  Filled 2018-05-17: qty 2

## 2018-05-17 MED ORDER — DIPHENHYDRAMINE HCL 25 MG PO CAPS
50.0000 mg | ORAL_CAPSULE | Freq: Once | ORAL | Status: AC
  Administered 2018-05-17: 50 mg via ORAL

## 2018-05-17 MED ORDER — HEPARIN SOD (PORK) LOCK FLUSH 100 UNIT/ML IV SOLN
500.0000 [IU] | Freq: Once | INTRAVENOUS | Status: AC | PRN
  Administered 2018-05-17: 500 [IU]
  Filled 2018-05-17: qty 5

## 2018-05-17 NOTE — Assessment & Plan Note (Addendum)
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 Adjuvant chemotherapy with [place] followed by Herceptin maintenance for a year started 09/07/2018-12/21/2017  3. Adjuvant radiation started 01/19/2018 4. Followed by adjuvant antiestrogen therapy with tamoxifen that was started prior to surgery -------------------------------------------------------------------- Treatment plan: Continue maintenance Herceptin Herceptin toxicities: None  Recent wound exploration by Dr. Georgette Dover Fatigue: Patient feels tired of coming to the infusions and expressed that she is very fatigued but that is probably because of her recent issues with surgery and the fact that the wound has not healed completely.  Return to clinic every 3 weeks for Herceptin and every 6 weeks for follow-up with me with labs.  Herceptin will likely be to be completed end of September 2019

## 2018-05-17 NOTE — Patient Instructions (Signed)
[facility] Discharge Instructions for Patients Receiving Chemotherapy  Today you received the following chemotherapy agents herceptin   To help prevent nausea and vomiting after your treatment, we encourage you to take your nausea medication as directed   If you develop nausea and vomiting that is not controlled by your nausea medication, call the clinic.   BELOW ARE SYMPTOMS THAT SHOULD BE REPORTED IMMEDIATELY:  *FEVER GREATER THAN 100.5 F  *CHILLS WITH OR WITHOUT FEVER  NAUSEA AND VOMITING THAT IS NOT CONTROLLED WITH YOUR NAUSEA MEDICATION  *UNUSUAL SHORTNESS OF BREATH  *UNUSUAL BRUISING OR BLEEDING  TENDERNESS IN MOUTH AND THROAT WITH OR WITHOUT PRESENCE OF ULCERS  *URINARY PROBLEMS  *BOWEL PROBLEMS  UNUSUAL RASH Items with * indicate a potential emergency and should be followed up as soon as possible.  Feel free to call the clinic you have any questions or concerns. The clinic phone number is [phone].  

## 2018-05-17 NOTE — Telephone Encounter (Signed)
No 6/25 los/orders/referrals

## 2018-05-17 NOTE — Progress Notes (Signed)
Received orders for Echocardiogram per Dr Lindi Adie.  Will schedule patient within a week per him and let pt know - she is in infusion room.

## 2018-05-17 NOTE — Progress Notes (Signed)
Patient Care Team: [name], [name], MD as PCP - General (Family Medicine)  DIAGNOSIS:  Encounter Diagnosis  Name Primary?  . Malignant neoplasm of upper-outer quadrant of right breast in female, estrogen receptor positive ([place])     SUMMARY OF ONCOLOGIC HISTORY:   Malignant neoplasm of upper-outer quadrant of right breast in female, estrogen receptor positive ([place])   07/15/2017 Initial Diagnosis    Patient palpated a week mass in the right upper outer quadrant breast near the axilla in March 2018 mammogram revealed irregular spiculated mass 1.4 cm, by ultrasound measured 1.1 cm with a suspicious right axillary lymph node; biopsy revealed IDC grade 3, lymph node benign, ER 90%, PR 0%, Ki-67 40%, HER-2 positive ratio 2.6, T1c N0 stage IA, AJCC 8 clinical stage      08/06/2017 Genetic Testing    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  The Hereditary Gene Panel offered by [facility] includes sequencing and/or deletion duplication testing of the following 46 genes: APC, ATM, AXIN2, BARD1, BMPR1A, BRCA1, BRCA2, BRIP1, CDH1, CDKN2A (p14ARF), CDKN2A (p16INK4a), CHEK2, CTNNA1, DICER1, EPCAM (Deletion/duplication testing only), GREM1 (promoter region deletion/duplication testing only), KIT, MEN1, MLH1, MSH2, MSH3, MSH6, MUTYH, NBN, NF1, NHTL1, PALB2, PDGFRA, PMS2, POLD1, POLE, PTEN, RAD50, RAD51C, RAD51D, SDHB, SDHC, SDHD, SMAD4, SMARCA4. STK11, TP53, TSC1, TSC2, and VHL.  The following genes were evaluated for sequence changes only: SDHA and HOXB13 c.251G>A variant only.  The report date is August 06, 2017.      08/11/2017 Surgery    Right lumpectomy: IDC with DCIS, 1.4 cm, grade 3, DCIS focally involving posterior margin, 0/3 lymph nodes negative, ER 90%, PR 0%, HER-2 positive ratio 2.6, Ki-67 40% T1 CN 0 stage IA      09/07/2017 - 12/21/2017 Adjuvant Chemotherapy    TCH x 6, then maintenance Herceptin x 1 year (Taxotere not given 4 cycles 5 and 6)      01/19/2018 - 03/04/2018 Radiation Therapy    Adjuvant radiation therapy       CHIEF COMPLIANT: Herceptin maintenance  INTERVAL HISTORY: Brianna [name] is a 37 year old with above-mentioned history of right breast cancer treated with lumpectomy followed by adjuvant chemotherapy and radiation.  Recently she had surgical procedure in her right axilla for nonhealing wound.  She feels extremely spent because of all of these appointments and treatments.  She feels fatigued as a result of that.  REVIEW OF SYSTEMS:   Constitutional: Denies fevers, chills or abnormal weight loss Eyes: Denies blurriness of vision Ears, nose, mouth, throat, and face: Denies mucositis or sore throat Respiratory: Denies cough, dyspnea or wheezes Cardiovascular: Denies palpitation, chest discomfort Gastrointestinal:  Denies nausea, heartburn or change in bowel habits Skin: Denies abnormal skin rashes Lymphatics: Denies new lymphadenopathy or easy bruising Neurological:Denies numbness, tingling or new weaknesses Behavioral/Psych: Mood is stable, no new changes  Extremities: No lower extremity edema Breast: Nonhealing wound in the right axilla All other systems were reviewed with the patient and are negative.  I have reviewed the past medical history, past surgical history, social history and family history with the patient and they are unchanged from previous note.  ALLERGIES:  has No Known Allergies.  MEDICATIONS:  Current Outpatient Medications  Medication Sig Dispense Refill  . Clindamycin-Benzoyl Per, Refr, gel Apply to face once daily in the morning.    . lidocaine-prilocaine (EMLA) cream Apply to affected area once 30 g 3  . LORazepam (ATIVAN) 0.5 MG tablet Take 0.5 mg by mouth at bedtime.    Marland Kitchen  naproxen sodium (ANAPROX) 220 MG tablet Take 220 mg by mouth as needed.    . Sulfacetamide Sodium-Sulfur (CLENIA FOAMING [place]) 10-5 % EMUL Apply topically.    . tretinoin (RETIN-A) 0.025 % gel Apply to face nightly      No current facility-administered medications for this visit.     PHYSICAL EXAMINATION: ECOG PERFORMANCE STATUS: 1 - Symptomatic but completely ambulatory  Vitals:   05/17/18 1351  BP: 114/70  Pulse: 83  Resp: 18  Temp: 99.2 F (37.3 C)  SpO2: 100%   Filed Weights   05/17/18 1351  Weight: 165 lb 1.6 oz (74.9 kg)    GENERAL:alert, no distress and comfortable SKIN: skin color, texture, turgor are normal, no rashes or significant lesions EYES: normal, Conjunctiva are pink and non-injected, sclera clear OROPHARYNX:no exudate, no erythema and lips, buccal mucosa, and tongue normal  NECK: supple, thyroid normal size, non-tender, without nodularity LYMPH:  no palpable lymphadenopathy in the cervical, axillary or inguinal LUNGS: clear to auscultation and percussion with normal breathing effort HEART: regular rate & rhythm and no murmurs and no lower extremity edema ABDOMEN:abdomen soft, non-tender and normal bowel sounds MUSCULOSKELETAL:no cyanosis of digits and no clubbing  NEURO: alert & oriented x 3 with fluent speech, no focal motor/sensory deficits EXTREMITIES: No lower extremity edema  LABORATORY DATA:  I have reviewed the data as listed CMP Latest Ref Rng & Units 05/17/2018 04/26/2018 04/05/2018  Glucose 70 - 99 mg/dL 155(H) 120 101  BUN 6 - 20 mg/dL _0 Creatinine 0.44 - 1.00 mg/dL 0.91 0.88 0.84  Sodium 135 - 145 mmol/L 136 138 137  Potassium 3.5 - 5.1 mmol/L 3.7 3.4(L) 3.6  Chloride 98 - 111 mmol/L 103 104 105  CO2 22 - 32 mmol/L _1 Calcium 8.9 - 10.3 mg/dL 9.1 9.4 9.2  Total Protein 6.5 - 8.1 g/dL 7.2 7.6 7.2  Total Bilirubin 0.3 - 1.2 mg/dL 0.2(L) 0.3 <0.2(L)  Alkaline Phos 38 - 126 U/L 79 74 69  AST 15 - 41 U/L _2 ALT 0 - 44 U/L _3 Lab Results  Component Value Date   WBC 4.2 05/17/2018   HGB 12.4 05/17/2018   HCT 37.9 05/17/2018   MCV 94.2 05/17/2018   PLT 218 05/17/2018   NEUTROABS 2.0 05/17/2018    ASSESSMENT & PLAN:    Malignant neoplasm of upper-outer quadrant of right breast in female, estrogen receptor positive ([place]) Right lumpectomy: IDC with DCIS, 1.4 cm, grade 3, DCIS focally involving posterior margin, 0/3 lymph nodes negative, ER 90%, PR 0%, HER-2 positive ratio 2.6, Ki-67 40% T1 CN 0 stage IA Tumor board did not recommend surgery for the positive posterior margin issue  Recommendation: 1. Adjuvant chemotherapy with [place] followed by Herceptin maintenance for a year started 09/07/2018-12/21/2017  3. Adjuvant radiation started 01/19/2018 4. Followed by adjuvant antiestrogen therapy with tamoxifen that was started prior to surgery -------------------------------------------------------------------- Treatment plan: Continue maintenance Herceptin Herceptin toxicities: None  Recent wound exploration by Dr. Georgette Dover Fatigue: Patient feels tired of coming to the infusions and expressed that she is very fatigued but that is probably because of her recent issues with surgery and the fact that the wound has not healed completely.  Return to clinic every 3 weeks for Herceptin and every 6 weeks for follow-up with me with labs.  Herceptin will likely be to be completed end of September 2019  No orders of the defined types were placed in  this encounter.  The patient has a good understanding of the overall plan. she agrees with it. she will call with any problems that may develop before the next visit here.   Harriette Ohara, MD 05/17/18

## 2018-05-17 NOTE — Progress Notes (Signed)
ECHO ordered today per Dr. Lindi Adie.  Pt aware.

## 2018-05-17 NOTE — Telephone Encounter (Signed)
Scheduled pt for July 5th for echo- pt not able to go due to she will be out of town. Rescheduled for July 9th at 9:45 am- pt agreeable. No other needs per pt at this time.

## 2018-05-20 ENCOUNTER — Encounter: Payer: Self-pay | Admitting: Family Medicine

## 2018-05-20 ENCOUNTER — Ambulatory Visit: Payer: 59 | Admitting: Family Medicine

## 2018-05-20 VITALS — BP 120/76 | HR 87 | Temp 99.2°F | Resp 12 | Ht 63.0 in | Wt 163.1 lb

## 2018-05-20 DIAGNOSIS — K581 Irritable bowel syndrome with constipation: Secondary | ICD-10-CM

## 2018-05-20 DIAGNOSIS — J069 Acute upper respiratory infection, unspecified: Secondary | ICD-10-CM

## 2018-05-20 DIAGNOSIS — K635 Polyp of colon: Secondary | ICD-10-CM

## 2018-05-20 MED ORDER — FLUTICASONE PROPIONATE 50 MCG/ACT NA SUSP
1.0000 | Freq: Two times a day (BID) | NASAL | 3 refills | Status: DC
Start: 2018-05-20 — End: 2018-08-12

## 2018-05-20 NOTE — Patient Instructions (Addendum)
A few things to remember from today's visit:   URI, acute - Plan: fluticasone (FLONASE) 50 MCG/ACT nasal spray  Polyp of colon, unspecified part of colon, unspecified type - Plan: Ambulatory referral to Gastroenterology  Irritable bowel syndrome with constipation  viral infections are self-limited and we treat each symptom depending of severity.  Over the counter medications as decongestants and cold medications usually help, they need to be taken with caution if there is a history of high blood pressure or palpitations.  So I prefer plain Mucinex if cough gets more productive.  Tylenol and/or Ibuprofen also helps with most symptoms (headache, muscle aching, fever,etc) Plenty of fluids. Honey helps with cough. Steam inhalations helps with runny nose, nasal congestion, and may prevent sinus infections. Cough and nasal congestion could last a few days and sometimes weeks. Please follow in not any better in 1-2 weeks or if symptoms get worse.   If constipation gets worse we can try prescription medication like Linzess or Amitiza.    Please be sure medication list is accurate. If a new problem present, please set up appointment sooner than planned today.

## 2018-05-20 NOTE — Progress Notes (Signed)
ACUTE VISIT   HPI:  Chief Complaint  Patient presents with  . Referral to GI  . URI    started Tuesday    Ms.Kameelah Rayle is a 37 y.o. female, who is here today complaining of 3 days of upper respiratory symptoms, started with sore throat, which has improved.  Rhinorrhea, nasal congestion, and postnasal drainage. Mild nonproductive cough. No dyspnea or wheezing. She has not tried OTC medications. No sick contacts or recent travel.   She is also requesting a referral to gastroenterologist, she would like to see Dr. Rana Snare.  She is reporting seeing GI about 7 years ago because she was having some "GI issues." She underwent colonoscopy and EGD, she was treated for H. pylori. According to patient, she underwent polypectomy and she is not sure about the pathology report.  A few months ago she was diagnosed with breast cancer,s wide/p lumpectomy.  She also begin radiotherapy and currently she is on chemotherapy. She is concerned about polyps being a risk factor for colon cancer.  Family history negative for colon cancer.  She has history of IBS constipation, which was exacerbated by chemotherapy but has improved with increasing fiber and fluid intake. She is having bowel movements every 2 days. No blood in stool or melena.    Review of Systems  Constitutional: Positive for chills and fatigue. Negative for activity change, appetite change and fever.  HENT: Positive for congestion, postnasal drip, sneezing and sore throat. Negative for ear pain, mouth sores, sinus pressure, trouble swallowing and voice change.   Eyes: Negative for discharge, redness and itching.  Respiratory: Positive for cough. Negative for shortness of breath and wheezing.   Gastrointestinal: Positive for constipation. Negative for abdominal pain, blood in stool, nausea and vomiting.  Musculoskeletal: Negative for myalgias and neck pain.  Skin: Negative for rash.  Allergic/Immunologic: Negative  for environmental allergies.  Neurological: Negative for weakness and headaches.      Current Outpatient Medications on File Prior to Visit  Medication Sig Dispense Refill  . Clindamycin-Benzoyl Per, Refr, gel Apply to face once daily in the morning.    . lidocaine-prilocaine (EMLA) cream Apply to affected area once 30 g 3  . LORazepam (ATIVAN) 0.5 MG tablet Take 0.5 mg by mouth at bedtime.    . naproxen sodium (ANAPROX) 220 MG tablet Take 220 mg by mouth as needed.    . Sulfacetamide Sodium-Sulfur (CLENIA FOAMING [place]) 10-5 % EMUL Apply topically.    . tretinoin (RETIN-A) 0.025 % gel Apply to face nightly     No current facility-administered medications on file prior to visit.      Past Medical History:  Diagnosis Date  . Acne   . Depression   . GERD (gastroesophageal reflux disease)   . History of concussion 08/2016  . IBS (irritable bowel syndrome)    no current med.  . Invasive ductal carcinoma of breast, right ([place]) 07/2017  . Pre-diabetes    No Known Allergies  Social History   Socioeconomic History  . Marital status: Married    Spouse name: Not on file  . Number of children: Not on file  . Years of education: Not on file  . Highest education level: Not on file  Occupational History  . Not on file  Social Needs  . Financial resource strain: Not on file  . Food insecurity:    Worry: Not on file    Inability: Not on file  . Transportation needs:  Medical: Not on file    Non-medical: Not on file  Tobacco Use  . Smoking status: Never Smoker  . Smokeless tobacco: Never Used  Substance and Sexual Activity  . Alcohol use: Yes    Comment: occasionally  . Drug use: No  . Sexual activity: Yes    Birth control/protection: None  Lifestyle  . Physical activity:    Days per week: Not on file    Minutes per session: Not on file  . Stress: Not on file  Relationships  . Social connections:    Talks on phone: Not on file    Gets together: Not on file     Attends religious service: Not on file    Active member of club or organization: Not on file    Attends meetings of clubs or organizations: Not on file    Relationship status: Not on file  Other Topics Concern  . Not on file  Social History Narrative  . Not on file    Vitals:   05/20/18 0725  BP: 120/76  Pulse: 87  Resp: 12  Temp: 99.2 F (37.3 C)  SpO2: 97%   Body mass index is 28.9 kg/m.   Physical Exam  Nursing note and vitals reviewed. Constitutional: She is oriented to person, place, and time. She appears well-developed. She does not appear ill. No distress.  HENT:  Head: Normocephalic and atraumatic.  Nose: Right sinus exhibits no maxillary sinus tenderness and no frontal sinus tenderness. Left sinus exhibits no maxillary sinus tenderness and no frontal sinus tenderness.  Mouth/Throat: Oropharynx is clear and moist and mucous membranes are normal.  Hypertrophic turbinates. Postnasal drainage.  Eyes: Conjunctivae are normal. No scleral icterus.  Cardiovascular: Normal rate and regular rhythm.  No murmur heard. Respiratory: Effort normal and breath sounds normal. No respiratory distress.  GI: Soft. Bowel sounds are normal. She exhibits no distension and no mass. There is no hepatomegaly. There is no tenderness.  Musculoskeletal: She exhibits no edema.  Lymphadenopathy:    She has no cervical adenopathy.       Right: No supraclavicular adenopathy present.       Left: No supraclavicular adenopathy present.  Neurological: She is alert and oriented to person, place, and time. She has normal strength.  Skin: Skin is warm. No rash noted. No erythema.  Psychiatric: She has a normal mood and affect.  Well groomed, good eye contact.    ASSESSMENT AND PLAN:   Ms. Desirea was seen today for referral to gi and uri.  Diagnoses and all orders for this visit:  URI, acute  Symptoms suggests a viral etiology, so symptomatic treatment recommended for now. Instructed to  monitor for signs of complications, including new onset of fever among some, clearly instructed about warning signs. I also explained that cough and nasal congestion can last a few days and sometimes weeks. F/U as needed.   -     fluticasone (FLONASE) 50 MCG/ACT nasal spray; Place 1 spray into both nostrils 2 (two) times daily.  Polyp of colon, unspecified part of colon, unspecified type  Explained that depending on the type of polyps more frequent colonoscopies are recommended. I think it is appropriate to have colonoscopy repeated. Referral to gastroenterology was placed.  -     Ambulatory referral to Gastroenterology  Irritable bowel syndrome with constipation  Currently better controlled with dietary changes. If needed in the future we can consider Amitiza or Linzess. Continue adequate fluid and fiber intake.  Return if symptoms worsen or fail to improve.     Candy Leverett G. [name], MD  [facility]. [place] office.

## 2018-05-27 ENCOUNTER — Other Ambulatory Visit (HOSPITAL_COMMUNITY): Payer: 59

## 2018-05-31 ENCOUNTER — Ambulatory Visit (HOSPITAL_COMMUNITY)
Admission: RE | Admit: 2018-05-31 | Discharge: 2018-05-31 | Disposition: A | Discharge status: Home / Self Care | Payer: 59 | Source: Ambulatory Visit | Attending: Hematology and Oncology | Admitting: Hematology and Oncology

## 2018-05-31 DIAGNOSIS — Z5181 Encounter for therapeutic drug level monitoring: Secondary | ICD-10-CM | POA: Diagnosis not present

## 2018-05-31 DIAGNOSIS — Z853 Personal history of malignant neoplasm of breast: Secondary | ICD-10-CM | POA: Diagnosis not present

## 2018-05-31 DIAGNOSIS — Z79899 Other long term (current) drug therapy: Secondary | ICD-10-CM | POA: Diagnosis not present

## 2018-05-31 DIAGNOSIS — R7303 Prediabetes: Secondary | ICD-10-CM | POA: Insufficient documentation

## 2018-05-31 NOTE — Progress Notes (Signed)
  Echocardiogram 2D Echocardiogram has been performed.  Merrie Roof F 05/31/2018, 12:20 PM

## 2018-06-07 ENCOUNTER — Inpatient Hospital Stay: Payer: 59

## 2018-06-07 ENCOUNTER — Inpatient Hospital Stay: Payer: 59 | Attending: Hematology and Oncology

## 2018-06-07 VITALS — BP 112/69 | HR 79 | Temp 98.3°F | Resp 16

## 2018-06-07 DIAGNOSIS — Z95828 Presence of other vascular implants and grafts: Secondary | ICD-10-CM

## 2018-06-07 DIAGNOSIS — Z170 Estrogen receptor positive status [ER+]: Principal | ICD-10-CM

## 2018-06-07 DIAGNOSIS — Z5112 Encounter for antineoplastic immunotherapy: Secondary | ICD-10-CM | POA: Diagnosis not present

## 2018-06-07 DIAGNOSIS — C50411 Malignant neoplasm of upper-outer quadrant of right female breast: Secondary | ICD-10-CM | POA: Diagnosis not present

## 2018-06-07 LAB — COMPREHENSIVE METABOLIC PANEL
ALK PHOS: 77 U/L (ref 38–126)
ALT: 15 U/L (ref 0–44)
ANION GAP: 6 (ref 5–15)
AST: 15 U/L (ref 15–41)
Albumin: 3.8 g/dL (ref 3.5–5.0)
BILIRUBIN TOTAL: 0.2 mg/dL — AB (ref 0.3–1.2)
BUN: 13 mg/dL (ref 6–20)
CHLORIDE: 105 mmol/L (ref 98–111)
CO2: 26 mmol/L (ref 22–32)
Calcium: 9.4 mg/dL (ref 8.9–10.3)
Creatinine, Ser: 0.83 mg/dL (ref 0.44–1.00)
GFR calc Af Amer: >60 mL/min (ref >60)
GFR calc non Af Amer: >60 mL/min (ref >60)
Glucose, Bld: 93 mg/dL (ref 70–99)
POTASSIUM: 3.8 mmol/L (ref 3.5–5.1)
Sodium: 137 mmol/L (ref 135–145)
TOTAL PROTEIN: 7.1 g/dL (ref 6.5–8.1)

## 2018-06-07 LAB — CBC WITH DIFFERENTIAL/PLATELET
Basophils Absolute: 0 10*3/uL (ref 0.0–0.1)
Basophils Relative: 0 %
Eosinophils Absolute: 0.1 10*3/uL (ref 0.0–0.5)
Eosinophils Relative: 2 %
HEMATOCRIT: 36.7 % (ref 34.8–46.6)
HEMOGLOBIN: 12.1 g/dL (ref 11.6–15.9)
Lymphocytes Relative: 59 %
Lymphs Abs: 2.4 10*3/uL (ref 0.9–3.3)
MCH: 30.3 pg (ref 25.1–34.0)
MCHC: 33 g/dL (ref 31.5–36.0)
MCV: 92 fL (ref 79.5–101.0)
MONO ABS: 0.2 10*3/uL (ref 0.1–0.9)
MONOS PCT: 6 %
NEUTROS ABS: 1.3 10*3/uL — AB (ref 1.5–6.5)
NEUTROS PCT: 33 %
Platelets: 224 10*3/uL (ref 145–400)
RBC: 3.99 MIL/uL (ref 3.70–5.45)
RDW: 14.8 % — ABNORMAL HIGH (ref 11.2–14.5)
WBC: 4 10*3/uL (ref 3.9–10.3)

## 2018-06-07 MED ORDER — SODIUM CHLORIDE 0.9% FLUSH
10.0000 mL | INTRAVENOUS | Status: DC | PRN
  Administered 2018-06-07: 10 mL via INTRAVENOUS
  Filled 2018-06-07: qty 10

## 2018-06-07 MED ORDER — DIPHENHYDRAMINE HCL 25 MG PO CAPS
50.0000 mg | ORAL_CAPSULE | Freq: Once | ORAL | Status: AC
  Administered 2018-06-07: 50 mg via ORAL

## 2018-06-07 MED ORDER — ACETAMINOPHEN 325 MG PO TABS
650.0000 mg | ORAL_TABLET | Freq: Once | ORAL | Status: AC
  Administered 2018-06-07: 650 mg via ORAL

## 2018-06-07 MED ORDER — ACETAMINOPHEN 325 MG PO TABS
ORAL_TABLET | ORAL | Status: AC
  Filled 2018-06-07: qty 2

## 2018-06-07 MED ORDER — SODIUM CHLORIDE 0.9 % IV SOLN
Freq: Once | INTRAVENOUS | Status: AC
  Administered 2018-06-07: 14:00:00 via INTRAVENOUS

## 2018-06-07 MED ORDER — TRASTUZUMAB CHEMO 150 MG IV SOLR
450.0000 mg | Freq: Once | INTRAVENOUS | Status: AC
  Administered 2018-06-07: 450 mg via INTRAVENOUS
  Filled 2018-06-07: qty 21.43

## 2018-06-07 MED ORDER — HEPARIN SOD (PORK) LOCK FLUSH 100 UNIT/ML IV SOLN
500.0000 [IU] | Freq: Once | INTRAVENOUS | Status: AC | PRN
Start: 2018-06-07 — End: 2018-06-07
  Administered 2018-06-07: 500 [IU]
  Filled 2018-06-07: qty 5

## 2018-06-07 MED ORDER — DIPHENHYDRAMINE HCL 25 MG PO CAPS
ORAL_CAPSULE | ORAL | Status: AC
  Filled 2018-06-07: qty 2

## 2018-06-07 MED ORDER — SODIUM CHLORIDE 0.9% FLUSH
10.0000 mL | INTRAVENOUS | Status: DC | PRN
  Administered 2018-06-07: 10 mL
  Filled 2018-06-07: qty 10

## 2018-06-07 NOTE — Patient Instructions (Signed)
[facility] Discharge Instructions for Patients Receiving Chemotherapy Today you received the following chemotherapy agents:  Herceptin To help prevent nausea and vomiting after your treatment, we encourage you to take your nausea medication as prescribed.   If you develop nausea and vomiting that is not controlled by your nausea medication, call the clinic.   BELOW ARE SYMPTOMS THAT SHOULD BE REPORTED IMMEDIATELY:  *FEVER GREATER THAN 100.5 F  *CHILLS WITH OR WITHOUT FEVER  NAUSEA AND VOMITING THAT IS NOT CONTROLLED WITH YOUR NAUSEA MEDICATION  *UNUSUAL SHORTNESS OF BREATH  *UNUSUAL BRUISING OR BLEEDING  TENDERNESS IN MOUTH AND THROAT WITH OR WITHOUT PRESENCE OF ULCERS  *URINARY PROBLEMS  *BOWEL PROBLEMS  UNUSUAL RASH Items with * indicate a potential emergency and should be followed up as soon as possible.  Feel free to call the clinic should you have any questions or concerns. The clinic phone number is [phone].  Please show the CHEMO ALERT CARD at check-in to the Emergency Department and triage nurse.   

## 2018-06-29 ENCOUNTER — Inpatient Hospital Stay: Payer: 59

## 2018-06-29 ENCOUNTER — Inpatient Hospital Stay (HOSPITAL_BASED_OUTPATIENT_CLINIC_OR_DEPARTMENT_OTHER): Payer: 59 | Admitting: Hematology and Oncology

## 2018-06-29 ENCOUNTER — Inpatient Hospital Stay: Payer: 59 | Attending: Hematology and Oncology

## 2018-06-29 ENCOUNTER — Telehealth: Payer: Self-pay | Admitting: Hematology and Oncology

## 2018-06-29 DIAGNOSIS — Z5112 Encounter for antineoplastic immunotherapy: Secondary | ICD-10-CM | POA: Diagnosis present

## 2018-06-29 DIAGNOSIS — Z170 Estrogen receptor positive status [ER+]: Secondary | ICD-10-CM

## 2018-06-29 DIAGNOSIS — C50411 Malignant neoplasm of upper-outer quadrant of right female breast: Secondary | ICD-10-CM

## 2018-06-29 DIAGNOSIS — R530 Neoplastic (malignant) related fatigue: Secondary | ICD-10-CM | POA: Diagnosis not present

## 2018-06-29 DIAGNOSIS — Z452 Encounter for adjustment and management of vascular access device: Secondary | ICD-10-CM | POA: Diagnosis not present

## 2018-06-29 DIAGNOSIS — Z95828 Presence of other vascular implants and grafts: Secondary | ICD-10-CM

## 2018-06-29 LAB — COMPREHENSIVE METABOLIC PANEL
ALK PHOS: 70 U/L (ref 38–126)
ALT: 12 U/L (ref 0–44)
AST: 16 U/L (ref 15–41)
Albumin: 3.9 g/dL (ref 3.5–5.0)
Anion gap: 10 (ref 5–15)
BUN: 14 mg/dL (ref 6–20)
CALCIUM: 9.1 mg/dL (ref 8.9–10.3)
CO2: 25 mmol/L (ref 22–32)
CREATININE: 0.96 mg/dL (ref 0.44–1.00)
Chloride: 103 mmol/L (ref 98–111)
Glucose, Bld: 91 mg/dL (ref 70–99)
Potassium: 4 mmol/L (ref 3.5–5.1)
Sodium: 138 mmol/L (ref 135–145)
Total Bilirubin: 0.3 mg/dL (ref 0.3–1.2)
Total Protein: 7.5 g/dL (ref 6.5–8.1)

## 2018-06-29 LAB — CBC WITH DIFFERENTIAL/PLATELET
BASOS PCT: 0 %
Basophils Absolute: 0 10*3/uL (ref 0.0–0.1)
EOS ABS: 0 10*3/uL (ref 0.0–0.5)
EOS PCT: 1 %
HCT: 37.3 % (ref 34.8–46.6)
HEMOGLOBIN: 12.3 g/dL (ref 11.6–15.9)
Lymphocytes Relative: 58 %
Lymphs Abs: 2.2 10*3/uL (ref 0.9–3.3)
MCH: 30.1 pg (ref 25.1–34.0)
MCHC: 33 g/dL (ref 31.5–36.0)
MCV: 91.4 fL (ref 79.5–101.0)
MONOS PCT: 6 %
Monocytes Absolute: 0.2 10*3/uL (ref 0.1–0.9)
NEUTROS PCT: 35 %
Neutro Abs: 1.4 10*3/uL — ABNORMAL LOW (ref 1.5–6.5)
Platelets: 201 10*3/uL (ref 145–400)
RBC: 4.08 MIL/uL (ref 3.70–5.45)
RDW: 14.4 % (ref 11.2–14.5)
WBC: 3.9 10*3/uL (ref 3.9–10.3)

## 2018-06-29 MED ORDER — SODIUM CHLORIDE 0.9 % IV SOLN
Freq: Once | INTRAVENOUS | Status: AC
  Administered 2018-06-29: 15:00:00 via INTRAVENOUS
  Filled 2018-06-29: qty 250

## 2018-06-29 MED ORDER — SODIUM CHLORIDE 0.9% FLUSH
10.0000 mL | INTRAVENOUS | Status: DC | PRN
  Administered 2018-06-29: 10 mL
  Filled 2018-06-29: qty 10

## 2018-06-29 MED ORDER — DIPHENHYDRAMINE HCL 25 MG PO CAPS
50.0000 mg | ORAL_CAPSULE | Freq: Once | ORAL | Status: AC
  Administered 2018-06-29: 50 mg via ORAL

## 2018-06-29 MED ORDER — SODIUM CHLORIDE 0.9% FLUSH
10.0000 mL | INTRAVENOUS | Status: DC | PRN
  Administered 2018-06-29: 10 mL via INTRAVENOUS
  Filled 2018-06-29: qty 10

## 2018-06-29 MED ORDER — ALTEPLASE 2 MG IJ SOLR
2.0000 mg | Freq: Once | INTRAMUSCULAR | Status: AC | PRN
  Administered 2018-06-29: 2 mg
  Filled 2018-06-29: qty 2

## 2018-06-29 MED ORDER — ACETAMINOPHEN 325 MG PO TABS
ORAL_TABLET | ORAL | Status: AC
  Filled 2018-06-29: qty 2

## 2018-06-29 MED ORDER — SODIUM CHLORIDE 0.9 % IV SOLN
450.0000 mg | Freq: Once | INTRAVENOUS | Status: AC
  Administered 2018-06-29: 450 mg via INTRAVENOUS
  Filled 2018-06-29: qty 21.43

## 2018-06-29 MED ORDER — SPIRONOLACTONE 100 MG PO TABS: 100.0000 mg | ORAL_TABLET | Freq: Every day | ORAL | Status: DC

## 2018-06-29 MED ORDER — HEPARIN SOD (PORK) LOCK FLUSH 100 UNIT/ML IV SOLN
500.0000 [IU] | Freq: Once | INTRAVENOUS | Status: AC | PRN
  Administered 2018-06-29: 500 [IU]
  Filled 2018-06-29: qty 5

## 2018-06-29 MED ORDER — ACETAMINOPHEN 325 MG PO TABS
650.0000 mg | ORAL_TABLET | Freq: Once | ORAL | Status: AC
  Administered 2018-06-29: 650 mg via ORAL

## 2018-06-29 MED ORDER — DIPHENHYDRAMINE HCL 25 MG PO CAPS
ORAL_CAPSULE | ORAL | Status: AC
Start: 2018-06-29 — End: ?
  Filled 2018-06-29: qty 2

## 2018-06-29 NOTE — Patient Instructions (Signed)
[facility] Discharge Instructions for Patients Receiving Chemotherapy  Today you received the following chemotherapy agents Herceptin  To help prevent nausea and vomiting after your treatment, we encourage you to take your nausea medication as directed   If you develop nausea and vomiting that is not controlled by your nausea medication, call the clinic.   BELOW ARE SYMPTOMS THAT SHOULD BE REPORTED IMMEDIATELY:  *FEVER GREATER THAN 100.5 F  *CHILLS WITH OR WITHOUT FEVER  NAUSEA AND VOMITING THAT IS NOT CONTROLLED WITH YOUR NAUSEA MEDICATION  *UNUSUAL SHORTNESS OF BREATH  *UNUSUAL BRUISING OR BLEEDING  TENDERNESS IN MOUTH AND THROAT WITH OR WITHOUT PRESENCE OF ULCERS  *URINARY PROBLEMS  *BOWEL PROBLEMS  UNUSUAL RASH Items with * indicate a potential emergency and should be followed up as soon as possible.  Feel free to call the clinic should you have any questions or concerns. The clinic phone number is [phone].  Please show the CHEMO ALERT CARD at check-in to the Emergency Department and triage nurse.   

## 2018-06-29 NOTE — Assessment & Plan Note (Signed)
Right lumpectomy: IDC with DCIS, 1.4 cm, grade 3, DCIS focally involving posterior margin, 0/3 lymph nodes negative, ER 90%, PR 0%, HER-2 positive ratio 2.6, Ki-67 40% T1 CN 0 stage IA Tumor board did not recommend surgery for the positive posterior margin issue  Recommendation: 1.Adjuvant chemotherapy with [place] followed by Herceptin maintenance for a yearstarted 09/07/2018-12/21/2017 3.Adjuvant radiationstarted 01/19/2018 4. Followed by adjuvant antiestrogen therapy with tamoxifen that was started prior to surgery -------------------------------------------------------------------- Treatment plan: Continue maintenance Herceptin and tamoxifen Herceptin toxicities: None  Recent wound exploration by Dr. Georgette Dover Fatigue:  Patient will finish her Herceptin infusions on 08/10/2018.   Tamoxifen toxicities:  Return to clinic on the last Herceptin treatment.

## 2018-06-29 NOTE — Telephone Encounter (Signed)
No 8/7 los/orders/referrals  °

## 2018-06-29 NOTE — Progress Notes (Signed)
Patient Care Team: [name], [name], MD as PCP - General (Family Medicine)  DIAGNOSIS:  Encounter Diagnosis  Name Primary?  . Malignant neoplasm of upper-outer quadrant of right breast in female, estrogen receptor positive ([place])     SUMMARY OF ONCOLOGIC HISTORY:   Malignant neoplasm of upper-outer quadrant of right breast in female, estrogen receptor positive ([place])   07/15/2017 Initial Diagnosis    Patient palpated a week mass in the right upper outer quadrant breast near the axilla in March 2018 mammogram revealed irregular spiculated mass 1.4 cm, by ultrasound measured 1.1 cm with a suspicious right axillary lymph node; biopsy revealed IDC grade 3, lymph node benign, ER 90%, PR 0%, Ki-67 40%, HER-2 positive ratio 2.6, T1c N0 stage IA, AJCC 8 clinical stage      08/06/2017 Genetic Testing    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  The Hereditary Gene Panel offered by [facility] includes sequencing and/or deletion duplication testing of the following 46 genes: APC, ATM, AXIN2, BARD1, BMPR1A, BRCA1, BRCA2, BRIP1, CDH1, CDKN2A (p14ARF), CDKN2A (p16INK4a), CHEK2, CTNNA1, DICER1, EPCAM (Deletion/duplication testing only), GREM1 (promoter region deletion/duplication testing only), KIT, MEN1, MLH1, MSH2, MSH3, MSH6, MUTYH, NBN, NF1, NHTL1, PALB2, PDGFRA, PMS2, POLD1, POLE, PTEN, RAD50, RAD51C, RAD51D, SDHB, SDHC, SDHD, SMAD4, SMARCA4. STK11, TP53, TSC1, TSC2, and VHL.  The following genes were evaluated for sequence changes only: SDHA and HOXB13 c.251G>A variant only.  The report date is August 06, 2017.      08/11/2017 Surgery    Right lumpectomy: IDC with DCIS, 1.4 cm, grade 3, DCIS focally involving posterior margin, 0/3 lymph nodes negative, ER 90%, PR 0%, HER-2 positive ratio 2.6, Ki-67 40% T1 CN 0 stage IA      09/07/2017 - 12/21/2017 Adjuvant Chemotherapy    TCH x 6, then maintenance Herceptin x 1 year (Taxotere not given 4 cycles 5 and 6)      01/19/2018 - 03/04/2018 Radiation Therapy    Adjuvant radiation therapy       CHIEF COMPLIANT: Follow-up on Herceptin maintenance  INTERVAL HISTORY: Brianna [name] is a 37-year with above-mentioned history of right breast cancer treated with surgery followed by adjuvant chemotherapy and radiation.  She is currently on Herceptin maintenance and she will finish Herceptin therapy on 08/10/2018.  She appears to be tolerating Herceptin reasonably well.  Her echocardiogram in July was normal.  She does feel fatigued after each treatment.  She is planning to go back to work on September 1.  She has not started tamoxifen therapy by her own preference.  She is very anxious about side effects of tamoxifen including the risk of uterine cancer.  REVIEW OF SYSTEMS:   Constitutional: Denies fevers, chills or abnormal weight loss Eyes: Denies blurriness of vision Ears, nose, mouth, throat, and face: Denies mucositis or sore throat Respiratory: Denies cough, dyspnea or wheezes Cardiovascular: Denies palpitation, chest discomfort Gastrointestinal:  Denies nausea, heartburn or change in bowel habits Skin: Denies abnormal skin rashes Lymphatics: Denies new lymphadenopathy or easy bruising Neurological:Denies numbness, tingling or new weaknesses Behavioral/Psych: Mood is stable, no new changes  Extremities: No lower extremity edema   All other systems were reviewed with the patient and are negative.  I have reviewed the past medical history, past surgical history, social history and family history with the patient and they are unchanged from previous note.  ALLERGIES:  has No Known Allergies.  MEDICATIONS:  Current Outpatient Medications  Medication Sig Dispense Refill  . Clindamycin-Benzoyl Per,  Refr, gel Apply to face once daily in the morning.    . fluticasone (FLONASE) 50 MCG/ACT nasal spray Place 1 spray into both nostrils 2 (two) times daily. 16 g 3  . lidocaine-prilocaine (EMLA) cream Apply to  affected area once 30 g 3  . LORazepam (ATIVAN) 0.5 MG tablet Take 0.5 mg by mouth at bedtime.    . naproxen sodium (ANAPROX) 220 MG tablet Take 220 mg by mouth as needed.    Marland Kitchen spironolactone (ALDACTONE) 100 MG tablet Take 1 tablet (100 mg total) by mouth daily.    . Sulfacetamide Sodium-Sulfur (CLENIA FOAMING [place]) 10-5 % EMUL Apply topically.    . tretinoin (RETIN-A) 0.025 % gel Apply to face nightly     No current facility-administered medications for this visit.     PHYSICAL EXAMINATION: ECOG PERFORMANCE STATUS: 1 - Symptomatic but completely ambulatory  Vitals:   06/29/18 1428  BP: 102/71  Pulse: 71  Resp: 18  Temp: 98.5 F (36.9 C)  SpO2: 100%   Filed Weights   06/29/18 1428  Weight: 162 lb 14.4 oz (73.9 kg)    GENERAL:alert, no distress and comfortable SKIN: skin color, texture, turgor are normal, no rashes or significant lesions EYES: normal, Conjunctiva are pink and non-injected, sclera clear OROPHARYNX:no exudate, no erythema and lips, buccal mucosa, and tongue normal  NECK: supple, thyroid normal size, non-tender, without nodularity LYMPH:  no palpable lymphadenopathy in the cervical, axillary or inguinal LUNGS: clear to auscultation and percussion with normal breathing effort HEART: regular rate & rhythm and no murmurs and no lower extremity edema ABDOMEN:abdomen soft, non-tender and normal bowel sounds MUSCULOSKELETAL:no cyanosis of digits and no clubbing  NEURO: alert & oriented x 3 with fluent speech, no focal motor/sensory deficits EXTREMITIES: No lower extremity edema    LABORATORY DATA:  I have reviewed the data as listed CMP Latest Ref Rng & Units 06/29/2018 06/07/2018 05/17/2018  Glucose 70 - 99 mg/dL 91 93 155(H)  BUN 6 - 20 mg/dL _0 Creatinine 0.44 - 1.00 mg/dL 0.96 0.83 0.91  Sodium 135 - 145 mmol/L 138 137 136  Potassium 3.5 - 5.1 mmol/L 4.0 3.8 3.7  Chloride 98 - 111 mmol/L 103 105 103  CO2 22 - 32 mmol/L _1 Calcium 8.9 - 10.3  mg/dL 9.1 9.4 9.1  Total Protein 6.5 - 8.1 g/dL 7.5 7.1 7.2  Total Bilirubin 0.3 - 1.2 mg/dL 0.3 0.2(L) 0.2(L)  Alkaline Phos 38 - 126 U/L 70 77 79  AST 15 - 41 U/L _2 ALT 0 - 44 U/L _3 Lab Results  Component Value Date   WBC 3.9 06/29/2018   HGB 12.3 06/29/2018   HCT 37.3 06/29/2018   MCV 91.4 06/29/2018   PLT 201 06/29/2018   NEUTROABS 1.4 (L) 06/29/2018    ASSESSMENT & PLAN:  Malignant neoplasm of upper-outer quadrant of right breast in female, estrogen receptor positive ([facility]) Right lumpectomy: IDC with DCIS, 1.4 cm, grade 3, DCIS focally involving posterior margin, 0/3 lymph nodes negative, ER 90%, PR 0%, HER-2 positive ratio 2.6, Ki-67 40% T1 CN 0 stage IA Tumor board did not recommend surgery for the positive posterior margin issue  Recommendation: 1.Adjuvant chemotherapy with [place] followed by Herceptin maintenance for a yearstarted 09/07/2018-12/21/2017 3.Adjuvant radiationstarted 01/19/2018 4. Followed by adjuvant antiestrogen therapy with tamoxifen that was started prior to surgery -------------------------------------------------------------------- Treatment plan: Continue maintenance Herceptin and tamoxifen Herceptin toxicities: None  Recent wound exploration  by Dr. Georgette Dover Fatigue: After each treatment she gets more tired.  Patient will finish her Herceptin infusions on 08/10/2018. Patient tells me that her menstrual cycles have resumed. She wants to hold off on tamoxifen until she finishes with Herceptin.  Return to clinic on the last Herceptin treatment.  No orders of the defined types were placed in this encounter.  The patient has a good understanding of the overall plan. she agrees with it. she will call with any problems that may develop before the next visit here.   Harriette Ohara, MD 06/29/18

## 2018-07-01 ENCOUNTER — Ambulatory Visit: Payer: 59 | Admitting: Family Medicine

## 2018-07-01 ENCOUNTER — Encounter: Payer: Self-pay | Admitting: Family Medicine

## 2018-07-01 ENCOUNTER — Encounter: Payer: Self-pay | Admitting: *Deleted

## 2018-07-01 VITALS — BP 104/70 | HR 79 | Temp 98.6°F | Resp 12 | Ht 63.0 in | Wt 161.5 lb

## 2018-07-01 DIAGNOSIS — L600 Ingrowing nail: Secondary | ICD-10-CM | POA: Diagnosis not present

## 2018-07-01 MED ORDER — CEPHALEXIN 500 MG PO CAPS: 500.0000 mg | ORAL_CAPSULE | Freq: Three times a day (TID) | ORAL | 0 refills | Status: AC

## 2018-07-01 NOTE — Progress Notes (Signed)
ACUTE VISIT   HPI:  Chief Complaint  Patient presents with  . Ingrown toe nail    left big toe    Brianna [name] is a 37 y.o. female, who is here today complaining of periungual tenderness left great toe. She has had pain intermittently for a while but usually improved after having pedicure done. Problem started again about 2 weeks ago,not better after pedicure. She has drained purulent material, which helped. + Erythema.  Negative for fever,chills,artrhalgias,or myalgias.  She has tried OTC topical bag balm,lanoline, and neosporin.     Review of Systems  Constitutional: Negative for appetite change, chills and fever.  Cardiovascular: Negative for leg swelling.  Gastrointestinal: Negative for abdominal pain, nausea and vomiting.  Musculoskeletal: Negative for arthralgias, gait problem and myalgias.  Skin: Negative for rash and wound.  Neurological: Negative for numbness.      Current Outpatient Medications on File Prior to Visit  Medication Sig Dispense Refill  . Adapalene-Benzoyl Peroxide 0.3-2.5 % GEL Apply 1 application topically daily.    . Clindamycin-Benzoyl Per, Refr, gel Apply to face once daily in the morning.    . fluticasone (FLONASE) 50 MCG/ACT nasal spray Place 1 spray into both nostrils 2 (two) times daily. 16 g 3  . lidocaine-prilocaine (EMLA) cream Apply to affected area once 30 g 3  . LORazepam (ATIVAN) 0.5 MG tablet Take 0.5 mg by mouth at bedtime.    . naproxen sodium (ANAPROX) 220 MG tablet Take 220 mg by mouth as needed.    Marland Kitchen spironolactone (ALDACTONE) 100 MG tablet Take 1 tablet (100 mg total) by mouth daily.    . Sulfacetamide Sodium-Sulfur (CLENIA FOAMING [place]) 10-5 % EMUL Apply topically.    . tretinoin (RETIN-A) 0.025 % gel Apply to face nightly     No current facility-administered medications on file prior to visit.      Past Medical History:  Diagnosis Date  . Acne   . Depression   . GERD (gastroesophageal reflux disease)    . History of concussion 08/2016  . IBS (irritable bowel syndrome)    no current med.  . Invasive ductal carcinoma of breast, right ([place]) 07/2017  . Pre-diabetes    No Known Allergies  Social History   Socioeconomic History  . Marital status: Married    Spouse name: Not on file  . Number of children: Not on file  . Years of education: Not on file  . Highest education level: Not on file  Occupational History  . Not on file  Social Needs  . Financial resource strain: Not on file  . Food insecurity:    Worry: Not on file    Inability: Not on file  . Transportation needs:    Medical: Not on file    Non-medical: Not on file  Tobacco Use  . Smoking status: Never Smoker  . Smokeless tobacco: Never Used  Substance and Sexual Activity  . Alcohol use: Yes    Comment: occasionally  . Drug use: No  . Sexual activity: Yes    Birth control/protection: None  Lifestyle  . Physical activity:    Days per week: Not on file    Minutes per session: Not on file  . Stress: Not on file  Relationships  . Social connections:    Talks on phone: Not on file    Gets together: Not on file    Attends religious service: Not on file    Active member of club or organization:  Not on file    Attends meetings of clubs or organizations: Not on file    Relationship status: Not on file  Other Topics Concern  . Not on file  Social History Narrative  . Not on file    Vitals:   07/01/18 0811  BP: 104/70  Pulse: 79  Resp: 12  Temp: 98.6 F (37 C)  SpO2: 98%   Body mass index is 28.61 kg/m.    Physical Exam  Nursing note and vitals reviewed. Constitutional: She is oriented to person, place, and time. She appears well-developed. No distress.  HENT:  Head: Normocephalic and atraumatic.  Eyes: Conjunctivae are normal.  Cardiovascular: Normal rate and regular rhythm.  Pulses:      Dorsalis pedis pulses are 2+ on the left side.       Posterior tibial pulses are 2+ on the left side.    Respiratory: Effort normal and breath sounds normal. No respiratory distress.  Musculoskeletal: She exhibits no edema.  Neurological: She is alert and oriented to person, place, and time. She has normal strength. Gait normal.  Skin: Skin is warm. No rash noted.  Inner periungual area of left great toe with mild erythema,edema,and tenderness. No drainage. Toe nail is normal on inspection.   Psychiatric: She has a normal mood and affect.  Well groomed, good eye contact.      ASSESSMENT AND PLAN:   Brianna [name] was seen today for ingrown toe nail.  Diagnoses and all orders for this visit:  Ingrown toenail of left foot with infection -     cephALEXin (KEFLEX) 500 MG capsule; Take 1 capsule (500 mg total) by mouth 3 (three) times daily for 7 days.    Improved after she drained it. Recommend soaking foot in warm water with epsom salt daily for a few days. Monitor for worsening symptoms. Arrange appt with podiatrist. Oral abx for 7 days,seems side effects discussed.  F/U as needed.   Return if symptoms worsen or fail to improve.      Jaxtin Raimondo G. [name], MD  [facility]. [place] office.

## 2018-07-01 NOTE — Patient Instructions (Addendum)
A few things to remember from today's visit:   Ingrown toenail of left foot with infection  Ingrown Toenail An ingrown toenail occurs when the corner or sides of your toenail grow into the surrounding skin. The big toe is most commonly affected, but it can happen to any of your toes. If your ingrown toenail is not treated, you will be at risk for infection. What are the causes? This condition may be caused by:  Wearing shoes that are too small or tight.  Injury or trauma, such as stubbing your toe or having your toe stepped on.  Improper cutting or care of your toenails.  Being born with (congenital) nail or foot abnormalities, such as having a nail that is too big for your toe.  What increases the risk? Risk factors for an ingrown toenail include:  Age. Your nails tend to thicken as you get older, so ingrown nails are more common in older people.  Diabetes.  Cutting your toenails incorrectly.  Blood circulation problems.  What are the signs or symptoms? Symptoms may include:  Pain, soreness, or tenderness.  Redness.  Swelling.  Hardening of the skin surrounding the toe.  Your ingrown toenail may be infected if there is fluid, pus, or drainage. How is this diagnosed? An ingrown toenail may be diagnosed by medical history and physical exam. If your toenail is infected, your health care provider may test a sample of the drainage. How is this treated? Treatment depends on the severity of your ingrown toenail. Some ingrown toenails may be treated at home. More severe or infected ingrown toenails may require surgery to remove all or part of the nail. Infected ingrown toenails may also be treated with antibiotic medicines. Follow these instructions at home:  If you were prescribed an antibiotic medicine, finish all of it even if you start to feel better.  Soak your foot in warm soapy water for 20 minutes, 3 times per day or as directed by your health care  provider.  Carefully lift the edge of the nail away from the sore skin by wedging a small piece of cotton under the corner of the nail. This may help with the pain. Be careful not to cause more injury to the area.  Wear shoes that fit well. If your ingrown toenail is causing you pain, try wearing sandals, if possible.  Trim your toenails regularly and carefully. Do not cut them in a curved shape. Cut your toenails straight across. This prevents injury to the skin at the corners of the toenail.  Keep your feet clean and dry.  If you are having trouble walking and are given crutches by your health care provider, use them as directed.  Do not pick at your toenail or try to remove it yourself.  Take medicines only as directed by your health care provider.  Keep all follow-up visits as directed by your health care provider. This is important. Contact a health care provider if:  Your symptoms do not improve with treatment. Get help right away if:  You have red streaks that start at your foot and go up your leg.  You have a fever.  You have increased redness, swelling, or pain.  You have fluid, blood, or pus coming from your toenail. This information is not intended to replace advice given to you by your health care provider. Make sure you discuss any questions you have with your health care provider. Document Released: 11/06/2000 Document Revised: 04/10/2016 Document Reviewed: 10/03/2014 Elsevier Interactive Patient  Education  2018 [employer].  Please arrange appt with podiatrist.

## 2018-07-03 ENCOUNTER — Encounter: Payer: Self-pay | Admitting: Family Medicine

## 2018-07-12 DIAGNOSIS — N939 Abnormal uterine and vaginal bleeding, unspecified: Secondary | ICD-10-CM | POA: Diagnosis not present

## 2018-07-18 DIAGNOSIS — K5900 Constipation, unspecified: Secondary | ICD-10-CM | POA: Diagnosis not present

## 2018-07-18 DIAGNOSIS — K625 Hemorrhage of anus and rectum: Secondary | ICD-10-CM | POA: Diagnosis not present

## 2018-07-19 DIAGNOSIS — N926 Irregular menstruation, unspecified: Secondary | ICD-10-CM | POA: Diagnosis not present

## 2018-07-19 DIAGNOSIS — N939 Abnormal uterine and vaginal bleeding, unspecified: Secondary | ICD-10-CM | POA: Diagnosis not present

## 2018-07-20 ENCOUNTER — Inpatient Hospital Stay: Payer: 59

## 2018-07-20 ENCOUNTER — Other Ambulatory Visit: Payer: Self-pay

## 2018-07-20 VITALS — BP 109/78 | HR 68 | Temp 98.9°F | Resp 16

## 2018-07-20 DIAGNOSIS — Z170 Estrogen receptor positive status [ER+]: Principal | ICD-10-CM

## 2018-07-20 DIAGNOSIS — Z5112 Encounter for antineoplastic immunotherapy: Secondary | ICD-10-CM | POA: Diagnosis not present

## 2018-07-20 DIAGNOSIS — C50411 Malignant neoplasm of upper-outer quadrant of right female breast: Secondary | ICD-10-CM

## 2018-07-20 DIAGNOSIS — Z95828 Presence of other vascular implants and grafts: Secondary | ICD-10-CM

## 2018-07-20 LAB — CBC WITH DIFFERENTIAL (CANCER CENTER ONLY)
Basophils Absolute: 0 10*3/uL (ref 0.0–0.1)
Basophils Relative: 1 %
EOS ABS: 0.1 10*3/uL (ref 0.0–0.5)
Eosinophils Relative: 1 %
HEMATOCRIT: 37.1 % (ref 34.8–46.6)
HEMOGLOBIN: 12.1 g/dL (ref 11.6–15.9)
LYMPHS ABS: 2.2 10*3/uL (ref 0.9–3.3)
LYMPHS PCT: 57 %
MCH: 30.2 pg (ref 25.1–34.0)
MCHC: 32.6 g/dL (ref 31.5–36.0)
MCV: 92.6 fL (ref 79.5–101.0)
MONOS PCT: 9 %
Monocytes Absolute: 0.3 10*3/uL (ref 0.1–0.9)
NEUTROS PCT: 32 %
Neutro Abs: 1.2 10*3/uL — ABNORMAL LOW (ref 1.5–6.5)
Platelet Count: 186 10*3/uL (ref 145–400)
RBC: 4.01 MIL/uL (ref 3.70–5.45)
RDW: 14.7 % — ABNORMAL HIGH (ref 11.2–14.5)
WBC: 3.9 10*3/uL (ref 3.9–10.3)

## 2018-07-20 LAB — CMP (CANCER CENTER ONLY)
ALT: 11 U/L (ref 0–44)
AST: 14 U/L — ABNORMAL LOW (ref 15–41)
Albumin: 3.6 g/dL (ref 3.5–5.0)
Alkaline Phosphatase: 69 U/L (ref 38–126)
Anion gap: 4 — ABNORMAL LOW (ref 5–15)
BUN: 15 mg/dL (ref 6–20)
CHLORIDE: 108 mmol/L (ref 98–111)
CO2: 26 mmol/L (ref 22–32)
CREATININE: 0.9 mg/dL (ref 0.44–1.00)
Calcium: 9.4 mg/dL (ref 8.9–10.3)
GFR, Estimated: >60 mL/min (ref >60)
Glucose, Bld: 82 mg/dL (ref 70–99)
POTASSIUM: 4.1 mmol/L (ref 3.5–5.1)
SODIUM: 138 mmol/L (ref 135–145)
Total Bilirubin: <0.2 mg/dL — ABNORMAL LOW (ref 0.3–1.2)
Total Protein: 7.2 g/dL (ref 6.5–8.1)

## 2018-07-20 MED ORDER — ACETAMINOPHEN 325 MG PO TABS
650.0000 mg | ORAL_TABLET | Freq: Once | ORAL | Status: AC
  Administered 2018-07-20: 650 mg via ORAL

## 2018-07-20 MED ORDER — SODIUM CHLORIDE 0.9% FLUSH
10.0000 mL | INTRAVENOUS | Status: AC | PRN
  Administered 2018-07-20: 10 mL
  Filled 2018-07-20: qty 10

## 2018-07-20 MED ORDER — HEPARIN SOD (PORK) LOCK FLUSH 100 UNIT/ML IV SOLN
500.0000 [IU] | Freq: Once | INTRAVENOUS | Status: AC | PRN
  Administered 2018-07-20: 500 [IU]
  Filled 2018-07-20: qty 5

## 2018-07-20 MED ORDER — DIPHENHYDRAMINE HCL 25 MG PO CAPS
ORAL_CAPSULE | ORAL | Status: AC
  Filled 2018-07-20: qty 2

## 2018-07-20 MED ORDER — SODIUM CHLORIDE 0.9% FLUSH
10.0000 mL | INTRAVENOUS | Status: DC | PRN
  Administered 2018-07-20: 10 mL via INTRAVENOUS
  Filled 2018-07-20: qty 10

## 2018-07-20 MED ORDER — TRASTUZUMAB CHEMO 150 MG IV SOLR
450.0000 mg | Freq: Once | INTRAVENOUS | Status: AC
  Administered 2018-07-20: 450 mg via INTRAVENOUS
  Filled 2018-07-20: qty 21.43

## 2018-07-20 MED ORDER — ACETAMINOPHEN 325 MG PO TABS
ORAL_TABLET | ORAL | Status: AC
  Filled 2018-07-20: qty 2

## 2018-07-20 MED ORDER — DIPHENHYDRAMINE HCL 25 MG PO CAPS
50.0000 mg | ORAL_CAPSULE | Freq: Once | ORAL | Status: AC
  Administered 2018-07-20: 50 mg via ORAL

## 2018-07-20 MED ORDER — SODIUM CHLORIDE 0.9 % IV SOLN
Freq: Once | INTRAVENOUS | Status: AC
  Administered 2018-07-20: 14:00:00 via INTRAVENOUS
  Filled 2018-07-20: qty 250

## 2018-07-20 NOTE — Patient Instructions (Signed)
[facility] Discharge Instructions for Patients Receiving Chemotherapy  Today you received the following chemotherapy agents Herceptin  To help prevent nausea and vomiting after your treatment, we encourage you to take your nausea medication as directed   If you develop nausea and vomiting that is not controlled by your nausea medication, call the clinic.   BELOW ARE SYMPTOMS THAT SHOULD BE REPORTED IMMEDIATELY:  *FEVER GREATER THAN 100.5 F  *CHILLS WITH OR WITHOUT FEVER  NAUSEA AND VOMITING THAT IS NOT CONTROLLED WITH YOUR NAUSEA MEDICATION  *UNUSUAL SHORTNESS OF BREATH  *UNUSUAL BRUISING OR BLEEDING  TENDERNESS IN MOUTH AND THROAT WITH OR WITHOUT PRESENCE OF ULCERS  *URINARY PROBLEMS  *BOWEL PROBLEMS  UNUSUAL RASH Items with * indicate a potential emergency and should be followed up as soon as possible.  Feel free to call the clinic should you have any questions or concerns. The clinic phone number is [phone].  Please show the CHEMO ALERT CARD at check-in to the Emergency Department and triage nurse.   

## 2018-07-29 ENCOUNTER — Telehealth: Payer: Self-pay | Admitting: Hematology and Oncology

## 2018-07-29 NOTE — Telephone Encounter (Signed)
Pt unable to come in on 9/19 - r/s to 9/20 patient is aware of appt date and time.

## 2018-08-02 ENCOUNTER — Telehealth: Payer: Self-pay | Admitting: Hematology and Oncology

## 2018-08-02 NOTE — Telephone Encounter (Signed)
[place] - moved 9/18 appointment to 9/19 - then moved from 9/19 to 9/20 . Left reminder message. Confirming change. See 9/6 note also.

## 2018-08-10 ENCOUNTER — Other Ambulatory Visit: Payer: 59

## 2018-08-10 ENCOUNTER — Ambulatory Visit: Payer: 59

## 2018-08-10 ENCOUNTER — Ambulatory Visit: Payer: 59 | Admitting: Hematology and Oncology

## 2018-08-11 ENCOUNTER — Other Ambulatory Visit: Payer: 59

## 2018-08-11 ENCOUNTER — Ambulatory Visit: Payer: 59 | Admitting: Hematology and Oncology

## 2018-08-11 ENCOUNTER — Ambulatory Visit: Payer: 59

## 2018-08-12 ENCOUNTER — Inpatient Hospital Stay: Payer: 59

## 2018-08-12 ENCOUNTER — Telehealth: Payer: Self-pay | Admitting: Hematology and Oncology

## 2018-08-12 ENCOUNTER — Inpatient Hospital Stay: Payer: 59 | Attending: Hematology and Oncology

## 2018-08-12 ENCOUNTER — Inpatient Hospital Stay (HOSPITAL_BASED_OUTPATIENT_CLINIC_OR_DEPARTMENT_OTHER): Payer: 59 | Admitting: Hematology and Oncology

## 2018-08-12 DIAGNOSIS — R530 Neoplastic (malignant) related fatigue: Secondary | ICD-10-CM

## 2018-08-12 DIAGNOSIS — C50411 Malignant neoplasm of upper-outer quadrant of right female breast: Secondary | ICD-10-CM

## 2018-08-12 DIAGNOSIS — Z5112 Encounter for antineoplastic immunotherapy: Secondary | ICD-10-CM | POA: Insufficient documentation

## 2018-08-12 DIAGNOSIS — Z170 Estrogen receptor positive status [ER+]: Secondary | ICD-10-CM

## 2018-08-12 DIAGNOSIS — Z95828 Presence of other vascular implants and grafts: Secondary | ICD-10-CM

## 2018-08-12 LAB — COMPREHENSIVE METABOLIC PANEL
ALK PHOS: 76 U/L (ref 38–126)
ALT: 11 U/L (ref 0–44)
ANION GAP: 7 (ref 5–15)
AST: 13 U/L — ABNORMAL LOW (ref 15–41)
Albumin: 3.4 g/dL — ABNORMAL LOW (ref 3.5–5.0)
BUN: 13 mg/dL (ref 6–20)
CALCIUM: 8.9 mg/dL (ref 8.9–10.3)
CO2: 24 mmol/L (ref 22–32)
Chloride: 109 mmol/L (ref 98–111)
Creatinine, Ser: 0.92 mg/dL (ref 0.44–1.00)
GFR calc Af Amer: >60 mL/min (ref >60)
GFR calc non Af Amer: >60 mL/min (ref >60)
Glucose, Bld: 98 mg/dL (ref 70–99)
Potassium: 4.1 mmol/L (ref 3.5–5.1)
SODIUM: 140 mmol/L (ref 135–145)
TOTAL PROTEIN: 6.7 g/dL (ref 6.5–8.1)
Total Bilirubin: <0.2 mg/dL — ABNORMAL LOW (ref 0.3–1.2)

## 2018-08-12 LAB — CBC WITH DIFFERENTIAL/PLATELET
BASOS ABS: 0 10*3/uL (ref 0.0–0.1)
BASOS PCT: 0 %
Eosinophils Absolute: 0.1 10*3/uL (ref 0.0–0.5)
Eosinophils Relative: 2 %
HEMATOCRIT: 36.6 % (ref 34.8–46.6)
HEMOGLOBIN: 11.9 g/dL (ref 11.6–15.9)
Lymphocytes Relative: 58 %
Lymphs Abs: 2.3 10*3/uL (ref 0.9–3.3)
MCH: 30.2 pg (ref 25.1–34.0)
MCHC: 32.5 g/dL (ref 31.5–36.0)
MCV: 92.9 fL (ref 79.5–101.0)
MONOS PCT: 8 %
Monocytes Absolute: 0.3 10*3/uL (ref 0.1–0.9)
NEUTROS ABS: 1.3 10*3/uL — AB (ref 1.5–6.5)
NEUTROS PCT: 32 %
Platelets: 214 10*3/uL (ref 145–400)
RBC: 3.94 MIL/uL (ref 3.70–5.45)
RDW: 14.8 % — ABNORMAL HIGH (ref 11.2–14.5)
WBC: 4 10*3/uL (ref 3.9–10.3)

## 2018-08-12 MED ORDER — HEPARIN SOD (PORK) LOCK FLUSH 100 UNIT/ML IV SOLN
500.0000 [IU] | Freq: Once | INTRAVENOUS | Status: DC | PRN
  Filled 2018-08-12: qty 5

## 2018-08-12 MED ORDER — SODIUM CHLORIDE 0.9 % IV SOLN
Freq: Once | INTRAVENOUS | Status: AC
  Administered 2018-08-12: 10:00:00 via INTRAVENOUS
  Filled 2018-08-12: qty 250

## 2018-08-12 MED ORDER — ACETAMINOPHEN 325 MG PO TABS
ORAL_TABLET | ORAL | Status: AC
  Filled 2018-08-12: qty 2

## 2018-08-12 MED ORDER — SODIUM CHLORIDE 0.9% FLUSH
10.0000 mL | INTRAVENOUS | Status: DC | PRN
  Administered 2018-08-12: 10 mL via INTRAVENOUS
  Filled 2018-08-12: qty 10

## 2018-08-12 MED ORDER — DIPHENHYDRAMINE HCL 25 MG PO CAPS
50.0000 mg | ORAL_CAPSULE | Freq: Once | ORAL | Status: AC
  Administered 2018-08-12: 50 mg via ORAL

## 2018-08-12 MED ORDER — SODIUM CHLORIDE 0.9% FLUSH
10.0000 mL | INTRAVENOUS | Status: DC | PRN
  Filled 2018-08-12: qty 10

## 2018-08-12 MED ORDER — TAMOXIFEN CITRATE 20 MG PO TABS: 20.0000 mg | ORAL_TABLET | Freq: Every day | ORAL | 3 refills | Status: DC

## 2018-08-12 MED ORDER — TRASTUZUMAB CHEMO 150 MG IV SOLR
450.0000 mg | Freq: Once | INTRAVENOUS | Status: AC
  Administered 2018-08-12: 450 mg via INTRAVENOUS
  Filled 2018-08-12: qty 21.43

## 2018-08-12 MED ORDER — ACETAMINOPHEN 325 MG PO TABS
650.0000 mg | ORAL_TABLET | Freq: Once | ORAL | Status: AC
  Administered 2018-08-12: 650 mg via ORAL

## 2018-08-12 MED ORDER — DIPHENHYDRAMINE HCL 25 MG PO CAPS
ORAL_CAPSULE | ORAL | Status: AC
  Filled 2018-08-12: qty 2

## 2018-08-12 NOTE — Progress Notes (Signed)
Per Dr. Lindi Adie, ok to treat today with last Echo.

## 2018-08-12 NOTE — Patient Instructions (Signed)
[facility] Discharge Instructions for Patients Receiving Chemotherapy  Today you received the following chemotherapy agents Herceptin  To help prevent nausea and vomiting after your treatment, we encourage you to take your nausea medication as directed   If you develop nausea and vomiting that is not controlled by your nausea medication, call the clinic.   BELOW ARE SYMPTOMS THAT SHOULD BE REPORTED IMMEDIATELY:  *FEVER GREATER THAN 100.5 F  *CHILLS WITH OR WITHOUT FEVER  NAUSEA AND VOMITING THAT IS NOT CONTROLLED WITH YOUR NAUSEA MEDICATION  *UNUSUAL SHORTNESS OF BREATH  *UNUSUAL BRUISING OR BLEEDING  TENDERNESS IN MOUTH AND THROAT WITH OR WITHOUT PRESENCE OF ULCERS  *URINARY PROBLEMS  *BOWEL PROBLEMS  UNUSUAL RASH Items with * indicate a potential emergency and should be followed up as soon as possible.  Feel free to call the clinic should you have any questions or concerns. The clinic phone number is [phone].  Please show the CHEMO ALERT CARD at check-in to the Emergency Department and triage nurse.   

## 2018-08-12 NOTE — Assessment & Plan Note (Signed)
Right lumpectomy: IDC with DCIS, 1.4 cm, grade 3, DCIS focally involving posterior margin, 0/3 lymph nodes negative, ER 90%, PR 0%, HER-2 positive ratio 2.6, Ki-67 40% T1 CN 0 stage IA Tumor board did not recommend surgery for the positive posterior margin issue  Recommendation: 1.Adjuvant chemotherapy with [place] followed by Herceptin maintenance for a yearstarted 09/07/2018-12/21/2017 3.Adjuvant radiationstarted 01/19/2018 4. Followed by adjuvant antiestrogen therapy with tamoxifen that was started prior to surgery -------------------------------------------------------------------- Treatment plan: Herceptin maintenance: Will finish today Herceptin toxicities: Fatigue  Recent wound exploration by Dr. Georgette Dover  Tamoxifen counseling:We discussed the risks and benefits of tamoxifen. These include but not limited to insomnia, hot flashes, mood changes, vaginal dryness, and weight gain. Although rare, serious side effects including endometrial cancer, risk of blood clots were also discussed. We strongly believe that the benefits far outweigh the risks. Patient understands these risks and consented to starting treatment. Planned treatment duration is 5 years.  Patient will stop tamoxifen now that Herceptin is complete.  She will return in 3 months for survivorship care plan visit

## 2018-08-12 NOTE — Telephone Encounter (Signed)
Gave pt avs and calendar  °

## 2018-08-12 NOTE — Progress Notes (Signed)
Patient Care Team: [name], [name], MD as PCP - General (Family Medicine)  DIAGNOSIS:  Encounter Diagnosis  Name Primary?  . Malignant neoplasm of upper-outer quadrant of right breast in female, estrogen receptor positive ([place])     SUMMARY OF ONCOLOGIC HISTORY:   Malignant neoplasm of upper-outer quadrant of right breast in female, estrogen receptor positive ([place])   07/15/2017 Initial Diagnosis    Patient palpated a week mass in the right upper outer quadrant breast near the axilla in March 2018 mammogram revealed irregular spiculated mass 1.4 cm, by ultrasound measured 1.1 cm with a suspicious right axillary lymph node; biopsy revealed IDC grade 3, lymph node benign, ER 90%, PR 0%, Ki-67 40%, HER-2 positive ratio 2.6, T1c N0 stage IA, AJCC 8 clinical stage    08/06/2017 Genetic Testing    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  The Hereditary Gene Panel offered by [facility] includes sequencing and/or deletion duplication testing of the following 46 genes: APC, ATM, AXIN2, BARD1, BMPR1A, BRCA1, BRCA2, BRIP1, CDH1, CDKN2A (p14ARF), CDKN2A (p16INK4a), CHEK2, CTNNA1, DICER1, EPCAM (Deletion/duplication testing only), GREM1 (promoter region deletion/duplication testing only), KIT, MEN1, MLH1, MSH2, MSH3, MSH6, MUTYH, NBN, NF1, NHTL1, PALB2, PDGFRA, PMS2, POLD1, POLE, PTEN, RAD50, RAD51C, RAD51D, SDHB, SDHC, SDHD, SMAD4, SMARCA4. STK11, TP53, TSC1, TSC2, and VHL.  The following genes were evaluated for sequence changes only: SDHA and HOXB13 c.251G>A variant only.  The report date is August 06, 2017.    08/11/2017 Surgery    Right lumpectomy: IDC with DCIS, 1.4 cm, grade 3, DCIS focally involving posterior margin, 0/3 lymph nodes negative, ER 90%, PR 0%, HER-2 positive ratio 2.6, Ki-67 40% T1 CN 0 stage IA    09/07/2017 - 12/21/2017 Adjuvant Chemotherapy    TCH x 6, then maintenance Herceptin x 1 year (Taxotere not given 4 cycles 5 and 6)    01/19/2018 -  03/04/2018 Radiation Therapy    Adjuvant radiation therapy     CHIEF COMPLIANT: Last Herceptin treatment  INTERVAL HISTORY: Brianna [name] is a 37 year old with above-mentioned his right breast cancer treated with lumpectomy followed by adjuvant chemotherapy and radiation and today's the last of her Herceptin treatments.  After each Herceptin she felt fatigued for several days.  Her energy levels not fully back to normal.  She is also here to think about starting her on tamoxifen therapy.  REVIEW OF SYSTEMS:   Constitutional: Denies fevers, chills or abnormal weight loss Eyes: Denies blurriness of vision Ears, nose, mouth, throat, and face: Denies mucositis or sore throat Respiratory: Denies cough, dyspnea or wheezes Cardiovascular: Denies palpitation, chest discomfort Gastrointestinal:  Denies nausea, heartburn or change in bowel habits Skin: Denies abnormal skin rashes Lymphatics: Denies new lymphadenopathy or easy bruising Neurological:Denies numbness, tingling or new weaknesses Behavioral/Psych: Mood is stable, no new changes  Extremities: No lower extremity edema Breast:  denies any pain or lumps or nodules in either breasts All other systems were reviewed with the patient and are negative.  I have reviewed the past medical history, past surgical history, social history and family history with the patient and they are unchanged from previous note.  ALLERGIES:  has No Known Allergies.  MEDICATIONS:  Current Outpatient Medications  Medication Sig Dispense Refill  . Clindamycin-Benzoyl Per, Refr, gel Apply to face once daily in the morning.    . lidocaine-prilocaine (EMLA) cream Apply to affected area once 30 g 3  . LORazepam (ATIVAN) 0.5 MG tablet Take 0.5 mg by mouth at bedtime.    Marland Kitchen  naproxen sodium (ANAPROX) 220 MG tablet Take 220 mg by mouth as needed.    . Sulfacetamide Sodium-Sulfur (CLENIA FOAMING [place]) 10-5 % EMUL Apply topically.    . tamoxifen (NOLVADEX) 20 MG tablet  Take 1 tablet (20 mg total) by mouth daily. 90 tablet 3  . tretinoin (RETIN-A) 0.025 % gel Apply to face nightly     No current facility-administered medications for this visit.    Facility-Administered Medications Ordered in Other Visits  Medication Dose Route Frequency Provider Last Rate Last Dose  . sodium chloride flush (NS) 0.9 % injection 10 mL  10 mL Intracatheter PRN Nicholas Lose, MD   10 mL at 07/20/18 1536    PHYSICAL EXAMINATION: ECOG PERFORMANCE STATUS: 1 - Symptomatic but completely ambulatory  Vitals:   08/12/18 0857  BP: 110/80  Pulse: 67  Resp: 18  Temp: 98 F (36.7 C)  SpO2: 100%   Filed Weights   08/12/18 0857  Weight: 170 lb (77.1 kg)    GENERAL:alert, no distress and comfortable SKIN: skin color, texture, turgor are normal, no rashes or significant lesions EYES: normal, Conjunctiva are pink and non-injected, sclera clear OROPHARYNX:no exudate, no erythema and lips, buccal mucosa, and tongue normal  NECK: supple, thyroid normal size, non-tender, without nodularity LYMPH:  no palpable lymphadenopathy in the cervical, axillary or inguinal LUNGS: clear to auscultation and percussion with normal breathing effort HEART: regular rate & rhythm and no murmurs and no lower extremity edema ABDOMEN:abdomen soft, non-tender and normal bowel sounds MUSCULOSKELETAL:no cyanosis of digits and no clubbing  NEURO: alert & oriented x 3 with fluent speech, no focal motor/sensory deficits EXTREMITIES: No lower extremity edema   LABORATORY DATA:  I have reviewed the data as listed CMP Latest Ref Rng & Units 07/20/2018 06/29/2018 06/07/2018  Glucose 70 - 99 mg/dL 82 91 93  BUN 6 - 20 mg/dL _0 Creatinine 0.44 - 1.00 mg/dL 0.90 0.96 0.83  Sodium 135 - 145 mmol/L 138 138 137  Potassium 3.5 - 5.1 mmol/L 4.1 4.0 3.8  Chloride 98 - 111 mmol/L 108 103 105  CO2 22 - 32 mmol/L _1 Calcium 8.9 - 10.3 mg/dL 9.4 9.1 9.4  Total Protein 6.5 - 8.1 g/dL 7.2 7.5 7.1  Total  Bilirubin 0.3 - 1.2 mg/dL <0.2(L) 0.3 0.2(L)  Alkaline Phos 38 - 126 U/L 69 70 77  AST 15 - 41 U/L 14(L) 16 15  ALT 0 - 44 U/L _2 Lab Results  Component Value Date   WBC 4.0 08/12/2018   HGB 11.9 08/12/2018   HCT 36.6 08/12/2018   MCV 92.9 08/12/2018   PLT 214 08/12/2018   NEUTROABS 1.3 (L) 08/12/2018    ASSESSMENT & PLAN:  Malignant neoplasm of upper-outer quadrant of right breast in female, estrogen receptor positive ([facility]) Right lumpectomy: IDC with DCIS, 1.4 cm, grade 3, DCIS focally involving posterior margin, 0/3 lymph nodes negative, ER 90%, PR 0%, HER-2 positive ratio 2.6, Ki-67 40% T1 CN 0 stage IA Tumor board did not recommend surgery for the positive posterior margin issue  Recommendation: 1.Adjuvant chemotherapy with [place] followed by Herceptin maintenance for a yearstarted 09/07/2018-12/21/2017 3.Adjuvant radiationstarted 01/19/2018 4. Followed by adjuvant antiestrogen therapy with tamoxifen that was started prior to surgery -------------------------------------------------------------------- Treatment plan: Herceptin maintenance: Will finish today Herceptin toxicities: Fatigue   Tamoxifen counseling:We discussed the risks and benefits of tamoxifen. These include but not limited to insomnia, hot flashes, mood changes, vaginal dryness, and weight  gain. Although rare, serious side effects including endometrial cancer, risk of blood clots were also discussed. We strongly believe that the benefits far outweigh the risks. Patient understands these risks and consented to starting treatment. Planned treatment duration is 5 years. I instructed her to start tamoxifen at 10 mg daily and then increase to 20 mg  She will return in 3 months for survivorship care plan visit    Orders Placed This Encounter  Procedures  . MM DIAG BREAST TOMO BILATERAL    Standing Status:   Future    Standing Expiration Date:   08/13/2019    Order Specific Question:   Reason for Exam  (SYMPTOM  OR DIAGNOSIS REQUIRED)    Answer:   Breast cancer annual mammogram    Order Specific Question:   Is the patient pregnant?    Answer:   No    Order Specific Question:   Preferred imaging location?    Answer:   [facility]   The patient has a good understanding of the overall plan. she agrees with it. she will call with any problems that may develop before the next visit here.   Harriette Ohara, MD 08/12/18

## 2018-08-16 DIAGNOSIS — K640 First degree hemorrhoids: Secondary | ICD-10-CM | POA: Diagnosis not present

## 2018-08-16 DIAGNOSIS — K921 Melena: Secondary | ICD-10-CM | POA: Diagnosis not present

## 2018-09-07 ENCOUNTER — Ambulatory Visit
Admission: RE | Admit: 2018-09-07 | Discharge: 2018-09-07 | Disposition: A | Discharge status: Home / Self Care | Payer: 59 | Source: Ambulatory Visit | Attending: Hematology and Oncology | Admitting: Hematology and Oncology

## 2018-09-07 DIAGNOSIS — Z853 Personal history of malignant neoplasm of breast: Secondary | ICD-10-CM | POA: Diagnosis not present

## 2018-09-07 DIAGNOSIS — R922 Inconclusive mammogram: Secondary | ICD-10-CM | POA: Diagnosis not present

## 2018-09-07 DIAGNOSIS — Z170 Estrogen receptor positive status [ER+]: Principal | ICD-10-CM

## 2018-09-07 DIAGNOSIS — C50411 Malignant neoplasm of upper-outer quadrant of right female breast: Secondary | ICD-10-CM

## 2018-09-07 HISTORY — DX: Personal history of irradiation: Z92.3

## 2018-09-07 HISTORY — DX: Personal history of antineoplastic chemotherapy: Z92.21

## 2018-09-09 ENCOUNTER — Ambulatory Visit: Payer: Self-pay | Admitting: Surgery

## 2018-09-14 ENCOUNTER — Encounter (HOSPITAL_COMMUNITY): Payer: Self-pay | Admitting: *Deleted

## 2018-09-14 ENCOUNTER — Other Ambulatory Visit: Payer: Self-pay

## 2018-09-14 NOTE — Progress Notes (Signed)
Pt denies SOB, chest pain, and being under the care of a cardiologist. Pt denies having a stress test and cardiac cath. Pt made aware to stop taking Aspirin, vitamins, fish oil and herbal medications. Do not take any NSAIDs ie: Ibuprofen, Advil, Naproxen (Aleve), Motrin, BC and Goody  Powder. Pt verbalized understanding of all pre-op instructions.

## 2018-09-15 ENCOUNTER — Ambulatory Visit (HOSPITAL_COMMUNITY): Payer: 59 | Admitting: Anesthesiology

## 2018-09-15 ENCOUNTER — Encounter (HOSPITAL_COMMUNITY): Payer: Self-pay

## 2018-09-15 ENCOUNTER — Ambulatory Visit (HOSPITAL_COMMUNITY)
Admission: RE | Admit: 2018-09-15 | Discharge: 2018-09-15 | Disposition: A | Discharge status: Home / Self Care | Payer: 59 | Source: Ambulatory Visit | Attending: Surgery | Admitting: Surgery

## 2018-09-15 ENCOUNTER — Encounter (HOSPITAL_COMMUNITY)
Admission: RE | Disposition: A | Discharge status: Home / Self Care | Payer: Self-pay | Source: Ambulatory Visit | Attending: Surgery

## 2018-09-15 ENCOUNTER — Other Ambulatory Visit: Payer: Self-pay

## 2018-09-15 DIAGNOSIS — Z853 Personal history of malignant neoplasm of breast: Secondary | ICD-10-CM | POA: Diagnosis not present

## 2018-09-15 DIAGNOSIS — Z79899 Other long term (current) drug therapy: Secondary | ICD-10-CM | POA: Insufficient documentation

## 2018-09-15 DIAGNOSIS — Z8249 Family history of ischemic heart disease and other diseases of the circulatory system: Secondary | ICD-10-CM | POA: Diagnosis not present

## 2018-09-15 DIAGNOSIS — Z9221 Personal history of antineoplastic chemotherapy: Secondary | ICD-10-CM | POA: Insufficient documentation

## 2018-09-15 DIAGNOSIS — Z452 Encounter for adjustment and management of vascular access device: Secondary | ICD-10-CM | POA: Insufficient documentation

## 2018-09-15 DIAGNOSIS — F329 Major depressive disorder, single episode, unspecified: Secondary | ICD-10-CM | POA: Insufficient documentation

## 2018-09-15 DIAGNOSIS — K219 Gastro-esophageal reflux disease without esophagitis: Secondary | ICD-10-CM | POA: Insufficient documentation

## 2018-09-15 DIAGNOSIS — Z923 Personal history of irradiation: Secondary | ICD-10-CM | POA: Diagnosis not present

## 2018-09-15 DIAGNOSIS — C50411 Malignant neoplasm of upper-outer quadrant of right female breast: Secondary | ICD-10-CM | POA: Diagnosis not present

## 2018-09-15 HISTORY — PX: PORT-A-CATH REMOVAL: SHX5289

## 2018-09-15 LAB — BASIC METABOLIC PANEL
Anion gap: 7 (ref 5–15)
BUN: 11 mg/dL (ref 6–20)
CHLORIDE: 108 mmol/L (ref 98–111)
CO2: 22 mmol/L (ref 22–32)
Calcium: 9 mg/dL (ref 8.9–10.3)
Creatinine, Ser: 0.96 mg/dL (ref 0.44–1.00)
GFR calc non Af Amer: >60 mL/min (ref >60)
Glucose, Bld: 89 mg/dL (ref 70–99)
POTASSIUM: 5.4 mmol/L — AB (ref 3.5–5.1)
Sodium: 137 mmol/L (ref 135–145)

## 2018-09-15 LAB — CBC
HEMATOCRIT: 39.5 % (ref 36.0–46.0)
Hemoglobin: 12.6 g/dL (ref 12.0–15.0)
MCH: 30.2 pg (ref 26.0–34.0)
MCHC: 31.9 g/dL (ref 30.0–36.0)
MCV: 94.7 fL (ref 80.0–100.0)
NRBC: 0 % (ref 0.0–0.2)
PLATELETS: 208 10*3/uL (ref 150–400)
RBC: 4.17 MIL/uL (ref 3.87–5.11)
RDW: 14 % (ref 11.5–15.5)
WBC: 4.2 10*3/uL (ref 4.0–10.5)

## 2018-09-15 LAB — POCT PREGNANCY, URINE: Preg Test, Ur: NEGATIVE

## 2018-09-15 LAB — GLUCOSE, CAPILLARY: Glucose-Capillary: 79 mg/dL (ref 70–99)

## 2018-09-15 SURGERY — REMOVAL PORT-A-CATH
Anesthesia: Monitor Anesthesia Care

## 2018-09-15 MED ORDER — FENTANYL CITRATE (PF) 100 MCG/2ML IJ SOLN
INTRAMUSCULAR | Status: DC | PRN
  Administered 2018-09-15 (×2): 25 ug via INTRAVENOUS
  Administered 2018-09-15: 50 ug via INTRAVENOUS

## 2018-09-15 MED ORDER — CHLORHEXIDINE GLUCONATE CLOTH 2 % EX PADS: 6.0000 | MEDICATED_PAD | Freq: Once | CUTANEOUS | Status: DC

## 2018-09-15 MED ORDER — PROPOFOL 10 MG/ML IV BOLUS
INTRAVENOUS | Status: AC
  Filled 2018-09-15: qty 20

## 2018-09-15 MED ORDER — ONDANSETRON HCL 4 MG/2ML IJ SOLN
INTRAMUSCULAR | Status: AC
  Filled 2018-09-15: qty 2

## 2018-09-15 MED ORDER — PROPOFOL 10 MG/ML IV BOLUS
INTRAVENOUS | Status: DC | PRN
  Administered 2018-09-15 (×3): 30 mg via INTRAVENOUS

## 2018-09-15 MED ORDER — BUPIVACAINE-EPINEPHRINE (PF) 0.25% -1:200000 IJ SOLN
INTRAMUSCULAR | Status: AC
  Filled 2018-09-15: qty 30

## 2018-09-15 MED ORDER — MIDAZOLAM HCL 2 MG/2ML IJ SOLN
INTRAMUSCULAR | Status: AC
  Filled 2018-09-15: qty 2

## 2018-09-15 MED ORDER — PROPOFOL 500 MG/50ML IV EMUL
INTRAVENOUS | Status: DC | PRN
  Administered 2018-09-15: 100 ug/kg/min via INTRAVENOUS

## 2018-09-15 MED ORDER — LIDOCAINE 2% (20 MG/ML) 5 ML SYRINGE
INTRAMUSCULAR | Status: DC | PRN
  Administered 2018-09-15: 100 mg via INTRAVENOUS

## 2018-09-15 MED ORDER — LIDOCAINE 2% (20 MG/ML) 5 ML SYRINGE
INTRAMUSCULAR | Status: AC
  Filled 2018-09-15: qty 5

## 2018-09-15 MED ORDER — MIDAZOLAM HCL 5 MG/5ML IJ SOLN
INTRAMUSCULAR | Status: DC | PRN
  Administered 2018-09-15: 2 mg via INTRAVENOUS

## 2018-09-15 MED ORDER — PROPOFOL 1000 MG/100ML IV EMUL
INTRAVENOUS | Status: AC
  Filled 2018-09-15: qty 100

## 2018-09-15 MED ORDER — ONDANSETRON HCL 4 MG/2ML IJ SOLN
INTRAMUSCULAR | Status: DC | PRN
  Administered 2018-09-15: 4 mg via INTRAVENOUS

## 2018-09-15 MED ORDER — FENTANYL CITRATE (PF) 250 MCG/5ML IJ SOLN
INTRAMUSCULAR | Status: AC
  Filled 2018-09-15: qty 5

## 2018-09-15 MED ORDER — 0.9 % SODIUM CHLORIDE (POUR BTL) OPTIME
TOPICAL | Status: DC | PRN
  Administered 2018-09-15: 1000 mL

## 2018-09-15 MED ORDER — BUPIVACAINE-EPINEPHRINE 0.25% -1:200000 IJ SOLN
INTRAMUSCULAR | Status: DC | PRN
  Administered 2018-09-15: 18 mL

## 2018-09-15 MED ORDER — LACTATED RINGERS IV SOLN
INTRAVENOUS | Status: DC
  Administered 2018-09-15: 13:00:00 via INTRAVENOUS

## 2018-09-15 SURGICAL SUPPLY — 31 items
BENZOIN TINCTURE PRP APPL 2/3 (GAUZE/BANDAGES/DRESSINGS) ×3 IMPLANT
CHLORAPREP W/TINT 10.5 ML (MISCELLANEOUS) ×3 IMPLANT
CLOSURE WOUND 1/2 X4 (GAUZE/BANDAGES/DRESSINGS) ×1
COVER SURGICAL LIGHT HANDLE (MISCELLANEOUS) ×3 IMPLANT
COVER WAND RF STERILE (DRAPES) ×3 IMPLANT
DECANTER SPIKE VIAL GLASS SM (MISCELLANEOUS) ×3 IMPLANT
DRAPE LAPAROTOMY T 102X78X121 (DRAPES) ×3 IMPLANT
DRAPE UTILITY XL STRL (DRAPES) ×6 IMPLANT
DRSG TEGADERM 2-3/8X2-3/4 SM (GAUZE/BANDAGES/DRESSINGS) ×3 IMPLANT
ELECT REM PT RETURN 9FT ADLT (ELECTROSURGICAL) ×3
ELECTRODE REM PT RTRN 9FT ADLT (ELECTROSURGICAL) ×1 IMPLANT
GAUZE SPONGE 2X2 8PLY STRL LF (GAUZE/BANDAGES/DRESSINGS) ×1 IMPLANT
GLOVE BIO SURGEON STRL SZ7 (GLOVE) ×3 IMPLANT
GLOVE BIOGEL PI IND STRL 7.5 (GLOVE) ×1 IMPLANT
GLOVE BIOGEL PI INDICATOR 7.5 (GLOVE) ×2
GOWN STRL REUS W/ TWL LRG LVL3 (GOWN DISPOSABLE) ×2 IMPLANT
GOWN STRL REUS W/TWL LRG LVL3 (GOWN DISPOSABLE) ×4
KIT BASIN OR (CUSTOM PROCEDURE TRAY) ×3 IMPLANT
KIT TURNOVER KIT B (KITS) ×3 IMPLANT
NEEDLE HYPO 25GX1X1/2 BEV (NEEDLE) ×3 IMPLANT
NS IRRIG 1000ML POUR BTL (IV SOLUTION) ×3 IMPLANT
PACK GENERAL/GYN (CUSTOM PROCEDURE TRAY) ×3 IMPLANT
PAD ARMBOARD 7.5X6 YLW CONV (MISCELLANEOUS) ×6 IMPLANT
SPONGE GAUZE 2X2 STER 10/PKG (GAUZE/BANDAGES/DRESSINGS) ×2
STRIP CLOSURE SKIN 1/2X4 (GAUZE/BANDAGES/DRESSINGS) ×2 IMPLANT
SUT MON AB 4-0 PC3 18 (SUTURE) ×3 IMPLANT
SUT VIC AB 3-0 SH 27 (SUTURE) ×2
SUT VIC AB 3-0 SH 27X BRD (SUTURE) ×1 IMPLANT
SYR CONTROL 10ML LL (SYRINGE) ×3 IMPLANT
TOWEL OR 17X24 6PK STRL BLUE (TOWEL DISPOSABLE) ×3 IMPLANT
TOWEL OR 17X26 10 PK STRL BLUE (TOWEL DISPOSABLE) ×3 IMPLANT

## 2018-09-15 NOTE — H&P (Signed)
Brianna [name] is an 37 y.o. female.   Chief Complaint: Breast cancer HPI: 37 yo female with right breast cancer presents now for port removal after completing chemotherapy.  Past Medical History:  Diagnosis Date  . Acne   . Depression   . GERD (gastroesophageal reflux disease)   . History of concussion 08/2016  . IBS (irritable bowel syndrome)    no current med.  . Invasive ductal carcinoma of breast, right ([place]) 07/2017  . Personal history of chemotherapy   . Personal history of radiation therapy   . Pre-diabetes     Past Surgical History:  Procedure Laterality Date  . BREAST LUMPECTOMY Right 2018  . BREAST LUMPECTOMY WITH RADIOACTIVE SEED AND SENTINEL LYMPH NODE BIOPSY Right 08/11/2017   Procedure: RIGHT BREAST LUMPECTOMY WITH RADIOACTIVE SEED AND RIGHT SENTINEL LYMPH NODE BIOPSY;  Surgeon: Donnie Mesa, MD;  Location: [facility];  Service: General;  Laterality: Right;  ERAS PATHWAY  . PORTACATH PLACEMENT Left 08/11/2017   Procedure: INSERTION PORT-A-CATH WITH ULTRA SOUND;  Surgeon: Donnie Mesa, MD;  Location: [facility];  Service: General;  Laterality: Left;  . WISDOM TOOTH EXTRACTION    . WOUND EXPLORATION Right 04/19/2018   Procedure: RIGHT AXILLARY WOUND EXPLORATION;  Surgeon: Donnie Mesa, MD;  Location: [facility];  Service: General;  Laterality: Right;    Family History  Problem Relation Age of Onset  . Heart disease Maternal Grandmother        d.53  . Stroke Maternal Grandfather   . Diabetes Maternal Grandfather   . Hyperlipidemia Mother   . Diabetes Maternal Uncle   . Heart attack Other   . Obesity Other   . COPD Other   . Asthma Other   . Breast cancer Other 7       Maternal grandfather's sister   Social History:  reports that she has never smoked. She has never used smokeless tobacco. She reports that she drinks alcohol. She reports that she does not use drugs.  Allergies: No Known Allergies  Medications  Prior to Admission  Medication Sig Dispense Refill  . Sulfacetamide Sodium-Sulfur (CLENIA FOAMING [place]) 10-5 % EMUL Apply 1 application topically 2 (two) times daily.     . tamoxifen (NOLVADEX) 20 MG tablet Take 1 tablet (20 mg total) by mouth daily. 90 tablet 3  . tretinoin (RETIN-A) 0.025 % gel Apply 1 application topically at bedtime.     . Clindamycin-Benzoyl Per, Refr, gel Apply 1 application topically daily.     . naproxen sodium (ANAPROX) 220 MG tablet Take 440 mg by mouth 2 (two) times daily as needed (for pain.).       Results for orders placed or performed during the hospital encounter of 09/15/18 (from the past 48 hour(s))  Basic metabolic panel     Status: Abnormal   Collection Time: 09/15/18  1:16 PM  Result Value Ref Range   Sodium 137 135 - 145 mmol/L   Potassium 5.4 (H) 3.5 - 5.1 mmol/L   Chloride 108 98 - 111 mmol/L   CO2 22 22 - 32 mmol/L   Glucose, Bld 89 70 - 99 mg/dL   BUN 11 6 - 20 mg/dL   Creatinine, Ser 0.96 0.44 - 1.00 mg/dL   Calcium 9.0 8.9 - 10.3 mg/dL   GFR calc non Af Amer >60 >60 mL/min   GFR calc Af Amer >60 >60 mL/min    Comment: (NOTE) The eGFR has been calculated using the CKD EPI equation.  This calculation has not been validated in all clinical situations. eGFR's persistently <60 mL/min signify possible Chronic Kidney Disease.    Anion gap 7 5 - 15    Comment: Performed at [facility] [address] [place] [address]  CBC     Status: None   Collection Time: 09/15/18  1:16 PM  Result Value Ref Range   WBC 4.2 4.0 - 10.5 K/uL   RBC 4.17 3.87 - 5.11 MIL/uL   Hemoglobin 12.6 12.0 - 15.0 g/dL   HCT 39.5 36.0 - 46.0 %   MCV 94.7 80.0 - 100.0 fL   MCH 30.2 26.0 - 34.0 pg   MCHC 31.9 30.0 - 36.0 g/dL   RDW 14.0 11.5 - 15.5 %   Platelets 208 150 - 400 K/uL   nRBC 0.0 0.0 - 0.2 %    Comment: Performed at [facility], [address], [place], [place] [address]  Pregnancy, urine POC     Status: None   Collection Time:  09/15/18  1:20 PM  Result Value Ref Range   Preg Test, Ur NEGATIVE NEGATIVE    Comment:        THE SENSITIVITY OF THIS METHODOLOGY IS >24 mIU/mL   Glucose, capillary     Status: None   Collection Time: 09/15/18  1:21 PM  Result Value Ref Range   Glucose-Capillary 79 70 - 99 mg/dL   Comment 1 Notify RN    Comment 2 Document in Chart    No results found.  [name]  Blood pressure 101/66, pulse 63, temperature 98.4 F (36.9 C), temperature source Oral, resp. rate 20, height _0  (1.6 m), weight 75.8 kg, last menstrual period 08/07/2018, SpO2 100 %. Physical Exam  Left chest port site c/d/i  Assessment/Plan Breast cancer after chemo Remove port  The surgical procedure has been discussed with the patient.  Potential risks, benefits, alternative treatments, and expected outcomes have been explained.  All of the patient's questions at this time have been answered.  The likelihood of reaching the patient's treatment goal is good.  The patient understand the proposed surgical procedure and wishes to proceed.   Maia Petties, MD 09/15/2018, 3:06 PM

## 2018-09-15 NOTE — Transfer of Care (Signed)
Immediate Anesthesia Transfer of Care Note  Patient: Brianna [name]  Procedure(s) Performed: REMOVAL PORT-A-CATH (N/A )  Patient Location: PACU  Anesthesia Type:MAC  Level of Consciousness: awake, oriented and patient cooperative  Airway & Oxygen Therapy: Patient Spontanous Breathing and Patient connected to face mask oxygen  Post-op Assessment: Report given to RN and Post -op Vital signs reviewed and stable  Post vital signs: Reviewed  Last Vitals:  Vitals Value Taken Time  BP    Temp    Pulse 83 09/15/2018  3:59 PM  Resp 15 09/15/2018  3:59 PM  SpO2 100 % 09/15/2018  3:59 PM  Vitals shown include unvalidated device data.  Last Pain:  Vitals:   09/15/18 1318  TempSrc: Oral  PainSc:          Complications: No apparent anesthesia complications

## 2018-09-15 NOTE — Op Note (Signed)
Pre-op Diagnosis:  Breast cancer s/p chemotherapy Post-op Diagnosis:  Same Procedure:  Port removal Surgeon:  Maia Petties Anesthesia:  MAC Indications:  This is a 37 yo female s/p right lumpectomy with SLN in 9/18.  S/p chemotherapy.  She is now ready for port removal.  Description of procedure:  The patient was brought to the OR and placed in a supine position on the OR table. She was given some intravenous sedation and her left chest was prepped with chloraprep and draped in sterile fashion.  We anesthetized the skin with 0.25% Marcaine with epinephrine.  We opened the incision and dissected down to the port with cautery.  The sutures were removed and the port catheter was removed.  Direct pressure was held for several minutes.  No bleeding was noted.  The wound was inspected for hemostasis.  We closed with 3-0 Vicryl and 4-0 Monocryl.  Benzoin and steri-strips were applied.  The patient was awakened and brought to the PACU in stable condition.  Counts were correct.  Imogene Burn. Georgette Dover, MD, [facility] Beeper [phone]  09/15/2018 3:54 PM

## 2018-09-15 NOTE — Anesthesia Procedure Notes (Signed)
Procedure Name: MAC Date/Time: 09/15/2018 3:22 PM Performed by: Jenne Campus, CRNA Pre-anesthesia Checklist: Patient identified, Emergency Drugs available, Suction available and Patient being monitored Oxygen Delivery Method: Simple face mask

## 2018-09-15 NOTE — Anesthesia Postprocedure Evaluation (Signed)
Anesthesia Post Note  Patient: Brianna [name]  Procedure(s) Performed: REMOVAL PORT-A-CATH (N/A )     Patient location during evaluation: PACU Anesthesia Type: MAC Level of consciousness: awake and alert Pain management: pain level controlled Vital Signs Assessment: post-procedure vital signs reviewed and stable Respiratory status: spontaneous breathing, nonlabored ventilation, respiratory function stable and patient connected to nasal cannula oxygen Cardiovascular status: stable and blood pressure returned to baseline Postop Assessment: no apparent nausea or vomiting Anesthetic complications: no    Last Vitals:  Vitals:   09/15/18 1619 09/15/18 1620  BP: 103/70   Pulse: 64   Resp: 13   Temp:  (!) 36.3 C  SpO2: 100%     Last Pain:  Vitals:   09/15/18 1620  TempSrc:   PainSc: 0-No pain                 Effie Berkshire

## 2018-09-15 NOTE — Anesthesia Preprocedure Evaluation (Addendum)
Anesthesia Evaluation  Patient identified by MRN, date of birth, ID band Patient awake    Reviewed: Allergy & Precautions, NPO status , Patient's Chart, lab work & pertinent test results  History of Anesthesia Complications Negative for: history of anesthetic complications  Airway Mallampati: I  TM Distance: >3 FB Neck ROM: Full    Dental no notable dental hx. (+) Teeth Intact, Dental Advisory Given   Pulmonary neg pulmonary [name],    Pulmonary exam normal breath sounds clear to auscultation       Cardiovascular negative cardio [name] Normal cardiovascular exam Rhythm:Regular Rate:Normal     Neuro/Psych negative neurological [name]  negative psych [name]   GI/Hepatic Neg liver [name], GERD  ,  Endo/Other  negative endocrine [name]  Renal/GU negative Renal [name]  negative genitourinary   Musculoskeletal negative musculoskeletal [name] (+)   Abdominal   Peds  Hematology negative hematology [name] (+)   Anesthesia Other Findings Right sided breast cancer. S/P chemo  Reproductive/Obstetrics                           Anesthesia Physical Anesthesia Plan  ASA: II  Anesthesia Plan: MAC   Post-op Pain Management:    Induction:   PONV Risk Score and Plan: Propofol infusion, Treatment may vary due to age or medical condition and Ondansetron  Airway Management Planned: Natural Airway, Nasal Cannula and Simple Face Mask  Additional Equipment: None  Intra-op Plan:   Post-operative Plan:   Informed Consent: I have reviewed the patients History and Physical, chart, labs and discussed the procedure including the risks, benefits and alternatives for the proposed anesthesia with the patient or authorized representative who has indicated his/her understanding and acceptance.     Plan Discussed with:   Anesthesia Plan Comments:        Anesthesia Quick Evaluation

## 2018-09-15 NOTE — Discharge Instructions (Signed)
[place] Office Phone Number [phone]   POST OP INSTRUCTIONS  Always review your discharge instruction sheet given to you by the facility where your surgery was performed.  IF YOU HAVE DISABILITY OR FAMILY LEAVE FORMS, YOU MUST BRING THEM TO THE OFFICE FOR PROCESSING.  DO NOT GIVE THEM TO YOUR DOCTOR.  1. You may take acetaminophen (Tylenol) or ibuprofen (Advil) as needed. 2. Take your usually prescribed medications unless otherwise directed 3. You should eat very light the first 24 hours after surgery, such as soup, crackers, pudding, etc.  Resume your normal diet the day after surgery. 4. Most patients will experience some swelling and bruising around the surgical site.  Ice packs will help.  Swelling and bruising can take several days to resolve.  5. It is common to experience some constipation if taking pain medication after surgery.  Increasing fluid intake and taking a stool softener will usually help or prevent this problem from occurring.  A mild laxative (Milk of Magnesia or Miralax) should be taken according to package directions if there are no bowel movements after 48 hours. 6. You may remove your bandages 48 hours after surgery, and you may shower at that time.  You will have steri-strips (small skin tapes) in place directly over the incision.  These strips should be left on the skin for 7-10 days.   7. ACTIVITIES:  You may resume regular daily activities (gradually increasing) beginning the next day.   You may have sexual intercourse when it is comfortable. a. You may drive when you no longer are taking prescription pain medication, you can comfortably wear a seatbelt, and you can safely maneuver your car and apply brakes. b. RETURN TO WORK:  1-2 weeks 8. You should see your doctor in the office for a follow-up appointment approximately two to three weeks after your surgery.    WHEN TO CALL YOUR DOCTOR: 1. Fever over 101.0 2. Nausea and/or  vomiting. 3. Extreme swelling or bruising. 4. Continued bleeding from incision. 5. Increased pain, redness, or drainage from the incision.  The clinic staff is available to answer your questions during regular business hours.  Please dont hesitate to call and ask to speak to one of the nurses for clinical concerns.  If you have a medical emergency, go to the nearest emergency room or call 911.  A surgeon from [facility] is always on call at the hospital.  For further questions, please visit [email or web address]

## 2018-09-16 ENCOUNTER — Encounter (HOSPITAL_COMMUNITY): Payer: Self-pay | Admitting: Surgery

## 2018-11-10 DIAGNOSIS — L819 Disorder of pigmentation, unspecified: Secondary | ICD-10-CM | POA: Diagnosis not present

## 2018-11-10 DIAGNOSIS — L700 Acne vulgaris: Secondary | ICD-10-CM | POA: Diagnosis not present

## 2018-11-10 DIAGNOSIS — L730 Acne keloid: Secondary | ICD-10-CM | POA: Diagnosis not present

## 2018-11-22 ENCOUNTER — Telehealth: Payer: Self-pay | Admitting: Hematology and Oncology

## 2018-11-22 ENCOUNTER — Encounter: Payer: Self-pay | Admitting: Adult Health

## 2018-11-22 ENCOUNTER — Inpatient Hospital Stay: Payer: 59 | Attending: Hematology and Oncology | Admitting: Adult Health

## 2018-11-22 DIAGNOSIS — Z79810 Long term (current) use of selective estrogen receptor modulators (SERMs): Secondary | ICD-10-CM | POA: Diagnosis not present

## 2018-11-22 DIAGNOSIS — Z9221 Personal history of antineoplastic chemotherapy: Secondary | ICD-10-CM

## 2018-11-22 DIAGNOSIS — Z170 Estrogen receptor positive status [ER+]: Secondary | ICD-10-CM

## 2018-11-22 DIAGNOSIS — C50411 Malignant neoplasm of upper-outer quadrant of right female breast: Secondary | ICD-10-CM | POA: Diagnosis not present

## 2018-11-22 DIAGNOSIS — Z923 Personal history of irradiation: Secondary | ICD-10-CM

## 2018-11-22 DIAGNOSIS — R6882 Decreased libido: Secondary | ICD-10-CM | POA: Diagnosis not present

## 2018-11-22 DIAGNOSIS — Z79899 Other long term (current) drug therapy: Secondary | ICD-10-CM

## 2018-11-22 NOTE — Progress Notes (Signed)
CLINIC:  Survivorship   REASON FOR VISIT:  Routine follow-up post-treatment for a recent history of breast cancer.  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  The Hereditary Gene Panel offered by [facility] includes sequencing and/or deletion duplication testing of the following 46 genes: APC, ATM, AXIN2, BARD1, BMPR1A, BRCA1, BRCA2, BRIP1, CDH1, CDKN2A (p14ARF), CDKN2A (p16INK4a), CHEK2, CTNNA1, DICER1, EPCAM (Deletion/duplication testing only), GREM1 (promoter region deletion/duplication testing only), KIT, MEN1, MLH1, MSH2, MSH3, MSH6, MUTYH, NBN, NF1, NHTL1, PALB2, PDGFRA, PMS2, POLD1, POLE, PTEN, RAD50, RAD51C, RAD51D, SDHB, SDHC, SDHD, SMAD4, SMARCA4. STK11, TP53, TSC1, TSC2, and VHL.  The following genes were evaluated for sequence changes only: SDHA and HOXB13 c.251G>A variant only.  The report date is August 06, 2017.    08/11/2017 Surgery    Right lumpectomy: IDC with DCIS, 1.4 cm, grade 3, DCIS focally involving posterior margin, 0/3 lymph nodes negative, ER 90%, PR 0%, HER-2 positive ratio 2.6, Ki-67 40% T1 CN 0 stage IA    09/07/2017 - 12/21/2017 Adjuvant Chemotherapy    TCH x 6, then maintenance Herceptin x 1 year (Taxotere not given 4 cycles 5 and 6)    01/19/2018 - 03/04/2018 Radiation Therapy    Adjuvant radiation therapy    08/12/2018 -  Anti-estrogen oral therapy    Tamoxifen      INTERVAL HISTORY:  Brianna [name] presents to the [facility] today for our initial meeting to review her survivorship care plan detailing her treatment course for breast cancer, as well as monitoring long-term side effects of that treatment, education regarding health maintenance, screening, and overall wellness and health promotion.     Overall, Brianna [name] reports feeling quite well.  She started Tamoxifen in 08/2018 and noted some mood changes.  She has also has noted a decreased libido.     REVIEW OF SYSTEMS:  Review of Systems  Constitutional: Negative for appetite change, chills, fatigue, fever and unexpected weight change.  HENT:   Negative for hearing loss, lump/mass and trouble swallowing.   Eyes: Negative for eye problems and icterus.  Respiratory: Negative for chest tightness and cough.   Cardiovascular: Negative for chest pain, leg swelling and palpitations.  Gastrointestinal: Negative for abdominal distention, abdominal pain, constipation, diarrhea, nausea and vomiting.  Endocrine: Negative for hot flashes.  Genitourinary: Positive for vaginal bleeding (resumed her cycle).   Musculoskeletal: Negative for arthralgias.  Skin: Negative for itching and rash.  Neurological: Negative for dizziness, extremity weakness, headaches and numbness.  Psychiatric/Behavioral: Negative for depression. The patient is not nervous/anxious.        Has some irritability    Breast: Denies any new nodularity, masses, tenderness, nipple changes, or nipple discharge.      ONCOLOGY TREATMENT TEAM:  1. Surgeon:  Dr. Georgette Dover at [facility] 2. Medical Oncologist: Dr. Lindi Adie  3. Radiation Oncologist: Dr. Lisbeth Renshaw    PAST MEDICAL/SURGICAL HISTORY:  Past Medical  History:  Diagnosis Date  . Acne   . Depression   . GERD (gastroesophageal reflux disease)   . History of concussion 08/2016  . IBS (irritable bowel syndrome)    no current med.  . Invasive ductal carcinoma of  breast, right ([place]) 07/2017  . Personal history of chemotherapy   . Personal history of radiation therapy   . Pre-diabetes    Past Surgical History:  Procedure Laterality Date  . BREAST LUMPECTOMY Right 2018  . BREAST LUMPECTOMY WITH RADIOACTIVE SEED AND SENTINEL LYMPH NODE BIOPSY Right 08/11/2017   Procedure: RIGHT BREAST LUMPECTOMY WITH RADIOACTIVE SEED AND RIGHT SENTINEL LYMPH NODE BIOPSY;  Surgeon: Donnie Mesa, MD;  Location: [facility];  Service: General;  Laterality: Right;  ERAS PATHWAY  . PORT-A-CATH REMOVAL N/A 09/15/2018   Procedure: REMOVAL PORT-A-CATH;  Surgeon: Donnie Mesa, MD;  Location: [facility];  Service: General;  Laterality: N/A;  . PORTACATH PLACEMENT Left 08/11/2017   Procedure: INSERTION PORT-A-CATH WITH ULTRA SOUND;  Surgeon: Donnie Mesa, MD;  Location: [facility];  Service: General;  Laterality: Left;  . WISDOM TOOTH EXTRACTION    . WOUND EXPLORATION Right 04/19/2018   Procedure: RIGHT AXILLARY WOUND EXPLORATION;  Surgeon: Donnie Mesa, MD;  Location: [facility];  Service: General;  Laterality: Right;     ALLERGIES:  No Known Allergies   CURRENT MEDICATIONS:  Outpatient Encounter Medications as of 11/22/2018  Medication Sig Note  . Clindamycin-Benzoyl Per, Refr, gel Apply 1 application topically daily.  09/12/2018: On hold due to currently out of this medication.  . naproxen sodium (ANAPROX) 220 MG tablet Take 440 mg by mouth 2 (two) times daily as needed (for pain.).    Marland Kitchen Sulfacetamide Sodium-Sulfur (CLENIA FOAMING WASH) 10-5 % EMUL Apply 1 application topically 2 (two) times daily.    . tamoxifen (NOLVADEX) 20 MG tablet Take 1 tablet (20 mg total) by mouth daily.   Marland Kitchen tretinoin (RETIN-A) 0.025 % gel Apply 1 application topically at bedtime.     Facility-Administered Encounter Medications as of 11/22/2018  Medication  . sodium chloride flush (NS) 0.9 % injection 10 mL     ONCOLOGIC FAMILY HISTORY:  Family  History  Problem Relation Age of Onset  . Heart disease Maternal Grandmother        d.53  . Stroke Maternal Grandfather   . Diabetes Maternal Grandfather   . Hyperlipidemia Mother   . Diabetes Maternal Uncle   . Heart attack Other   . Obesity Other   . COPD Other   . Asthma Other   . Breast cancer Other 89       Maternal grandfather's sister     GENETIC COUNSELING/TESTING: See above  SOCIAL HISTORY:  Social History   Socioeconomic History  . Marital status: Married    Spouse name: Not on file  . Number of children: Not on file  . Years of education: Not on file  . Highest education level: Not on file  Occupational History  . Not on file  Social Needs  . Financial resource strain: Not on file  . Food insecurity:    Worry: Not on file    Inability: Not on file  . Transportation needs:    Medical: Not on file    Non-medical: Not on file  Tobacco Use  . Smoking status: Never Smoker  . Smokeless tobacco: Never Used  Substance and Sexual Activity  . Alcohol use: Yes    Comment: occasionally  . Drug  use: No  . Sexual activity: Yes    Birth control/protection: None  Lifestyle  . Physical activity:    Days per week: Not on file    Minutes per session: Not on file  . Stress: Not on file  Relationships  . Social connections:    Talks on phone: Not on file    Gets together: Not on file    Attends religious service: Not on file    Active member of club or organization: Not on file    Attends meetings of clubs or organizations: Not on file    Relationship status: Not on file  . Intimate partner violence:    Fear of current or ex partner: Not on file    Emotionally abused: Not on file    Physically abused: Not on file    Forced sexual activity: Not on file  Other Topics Concern  . Not on file  Social History Narrative  . Not on file     PHYSICAL EXAMINATION:  Vital Signs: Normal per patient, not documented by nursing tech  There were no vitals filed for  this visit. There were no vitals filed for this visit. General: Well-nourished, well-appearing female in no acute distress.  She is unaccompanied today.   HEENT: Head is normocephalic.  Pupils equal and reactive to light. Conjunctivae clear without exudate.  Sclerae anicteric. Oral mucosa is pink, moist.  Oropharynx is pink without lesions or erythema.  Lymph: No cervical, supraclavicular, or infraclavicular lymphadenopathy noted on palpation.  Cardiovascular: Regular rate and rhythm.Marland Kitchen Respiratory: Clear to auscultation bilaterally. Chest expansion symmetric; breathing non-labored.  Breast: right breast s/p lumpectomy and radiation, no sign of local recurrence, left breast benign GI: Abdomen soft and round; non-tender, non-distended. Bowel sounds normoactive.  GU: Deferred.  Neuro: No focal deficits. Steady gait.  Psych: Mood and affect normal and appropriate for situation.  Extremities: No edema. MSK: No focal spinal tenderness to palpation.  Full range of motion in bilateral upper extremities Skin: Warm and dry.  LABORATORY DATA:  None for this visit.  DIAGNOSTIC IMAGING:  None for this visit.      ASSESSMENT AND PLAN:  Brianna [name] is a pleasant 37 y.o. female with Stage IA right breast invasive ductal carcinoma, ER+/PR-/HER2+, diagnosed in 06/2017, treated with lumpectomy, adjuvant chemotherapy, maintenance Trastuzumab, adjuvant radiation therapy, and anti-estrogen therapy with Tamoxifen beginning in 07/2018.  She presents to the Survivorship Clinic for our initial meeting and routine follow-up post-completion of treatment for breast cancer.    1. Stage IA right breast cancer:  Brianna [name] is continuing to recover from definitive treatment for breast cancer. She will follow-up with her medical oncologist, Dr. Lindi Adie in three months with history and physical exam per surveillance protocol.  She will continue her anti-estrogen therapy with Tamoxifen for now.  She is having some issues see  #2.  Today, a comprehensive survivorship care plan and treatment summary was reviewed with the patient today detailing her breast cancer diagnosis, treatment course, potential late/long-term effects of treatment, appropriate follow-up care with recommendations for the future, and patient education resources.  A copy of this summary, along with a letter will be sent to the patient's primary care provider via mail/fax/In Basket message after today's visit.    2. Mood changes and vaginal bleeding: We reviewed her issues of mood changes and vaginal bleeding.  Right now, it sounds like her menstrual cycles returned.  She has a gynecologist.  She is also moodier than usual.  She will  give it until February.  If things are not improved we discussed potentially starting Effexor, or changing her to Zoladex and Anastrozole.  She is in agreement and will let us know.  3. Bone health:  Given Brianna [name] history of breast cancer, she is at slight risk for bone demineralization.  I counseled her that Tamoxifen will help to increase her bone density.  She understands that if she does change to Zoladex and Anastrozole we will need to order bone density testing.  She was given education on specific activities to promote bone health.  4. Cancer screening:  Due to Brianna [name] history and her age, she should receive screening for skin cancers and gynecologic cancers.  The information and recommendations are listed on the patient's comprehensive care plan/treatment summary and were reviewed in detail with the patient.    5. Health maintenance and wellness promotion: Brianna [name] was encouraged to consume 5-7 servings of fruits and vegetables per day. We reviewed the "Nutrition Rainbow" handout, as well as the handout "Take Control of Your Health and Reduce Your Cancer Risk" from the [employer].  She was also encouraged to engage in moderate to vigorous exercise for 30 minutes per day most days of the week. We  discussed the [employer] fitness program, which is designed for cancer survivors to help them become more physically fit after cancer treatments.  She was instructed to limit her alcohol consumption and continue to abstain from tobacco use.     6. Support services/counseling: It is not uncommon for this period of the patient's cancer care trajectory to be one of many emotions and stressors.  We discussed an opportunity for her to participate in the next session of [facility] ("Finding Your New Normal") support group series designed for patients after they have completed treatment.   Brianna [name] was encouraged to take advantage of our many other support services programs, support groups, and/or counseling in coping with her new life as a cancer survivor after completing anti-cancer treatment.  She was offered support today through active listening and expressive supportive counseling.  She was given information regarding our available services and encouraged to contact me with any questions or for help enrolling in any of our support group/programs.    Dispo:   -Return to cancer center in 3 months for f/u with Dr. Lindi Adie  -Mammogram due in 08/2019 -Follow up with surgery per Dr. Georgette Dover -She is welcome to return back to the Survivorship Clinic at any time; no additional follow-up needed at this time.  -Consider referral back to survivorship as a long-term survivor for continued surveillance  A total of (30) minutes of face-to-face time was spent with this patient with greater than 50% of that time in counseling and care-coordination.   Gardenia Phlegm, NP Survivorship Program [facility] [phone]   Note: PRIMARY CARE PROVIDER [name], [name], [place] [phone]

## 2018-11-22 NOTE — Telephone Encounter (Signed)
Gave avs and calendar ° °

## 2018-12-15 DIAGNOSIS — C50911 Malignant neoplasm of unspecified site of right female breast: Secondary | ICD-10-CM | POA: Diagnosis not present

## 2019-01-04 IMAGING — MG MM CLIP PLACEMENT
3 series · 3 of 3 positions shown · non-contrast
Comparison: Previous exam(s).

CLINICAL DATA: Clip films status post ultrasound-guided biopsy of a
right breast mass and right axillary lymph node.

EXAM:
DIAGNOSTIC RIGHT MAMMOGRAM POST ULTRASOUND BIOPSY

[R ML]
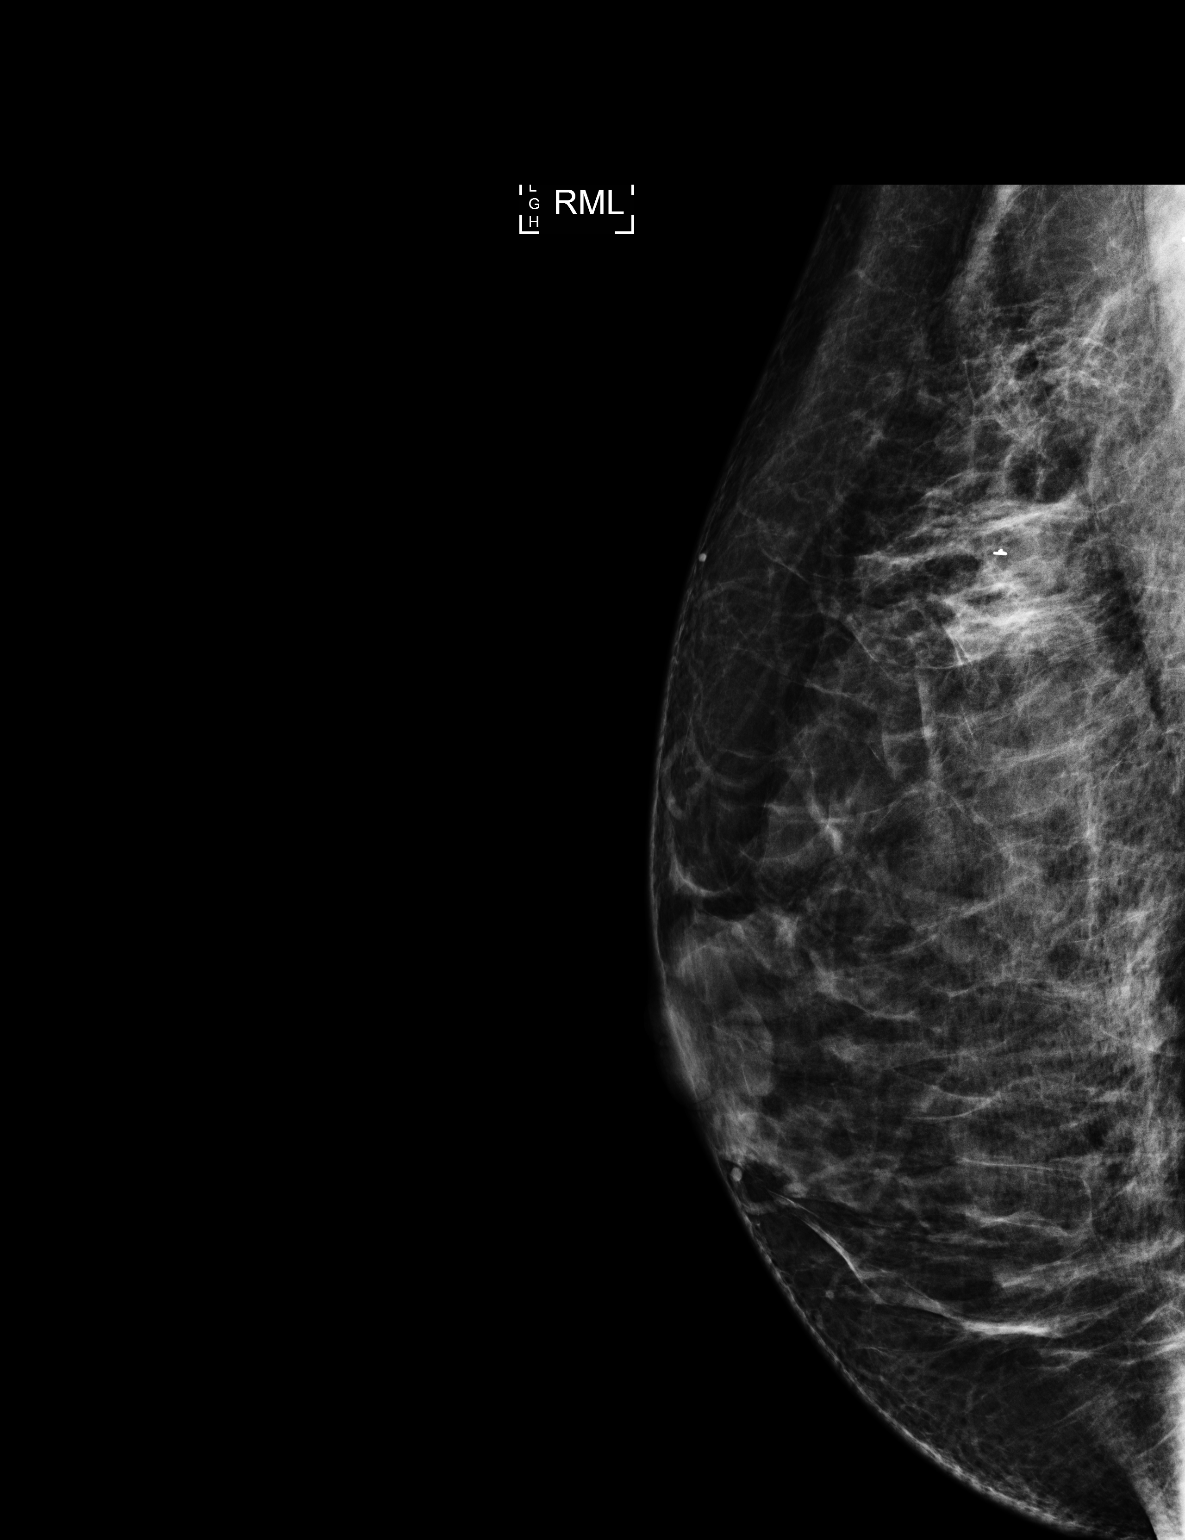

[R XCCL]
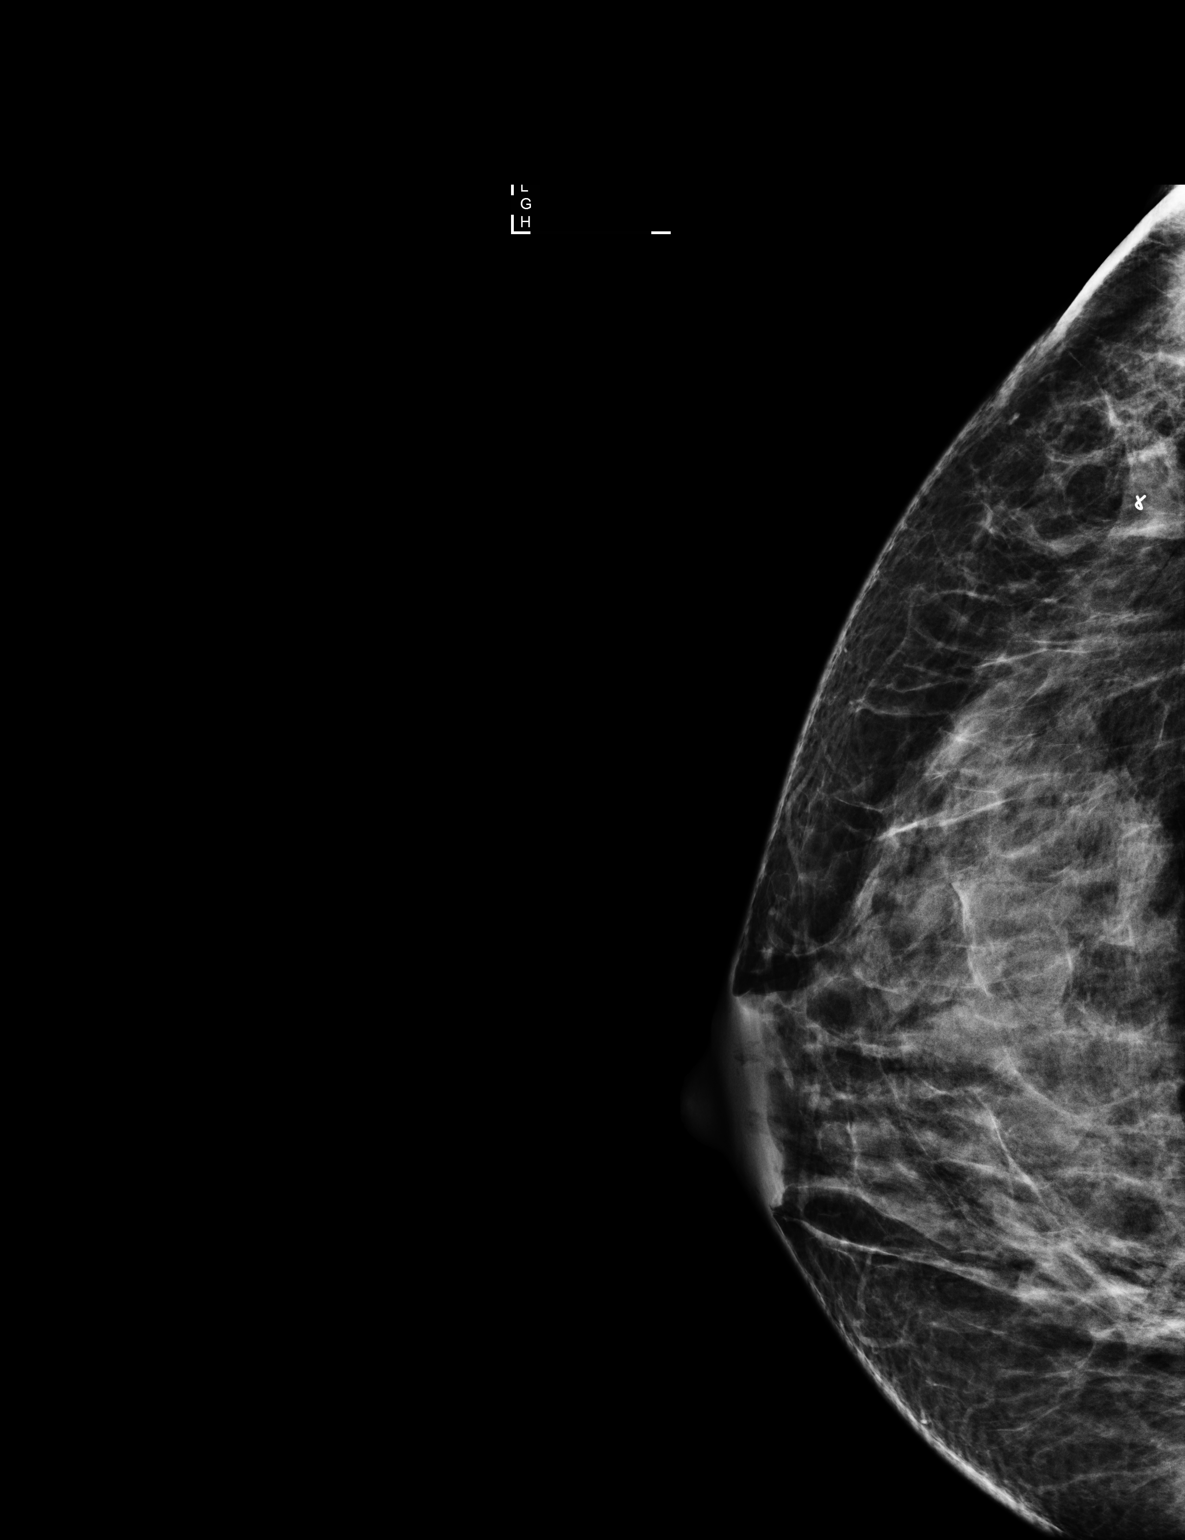

[R MLO]
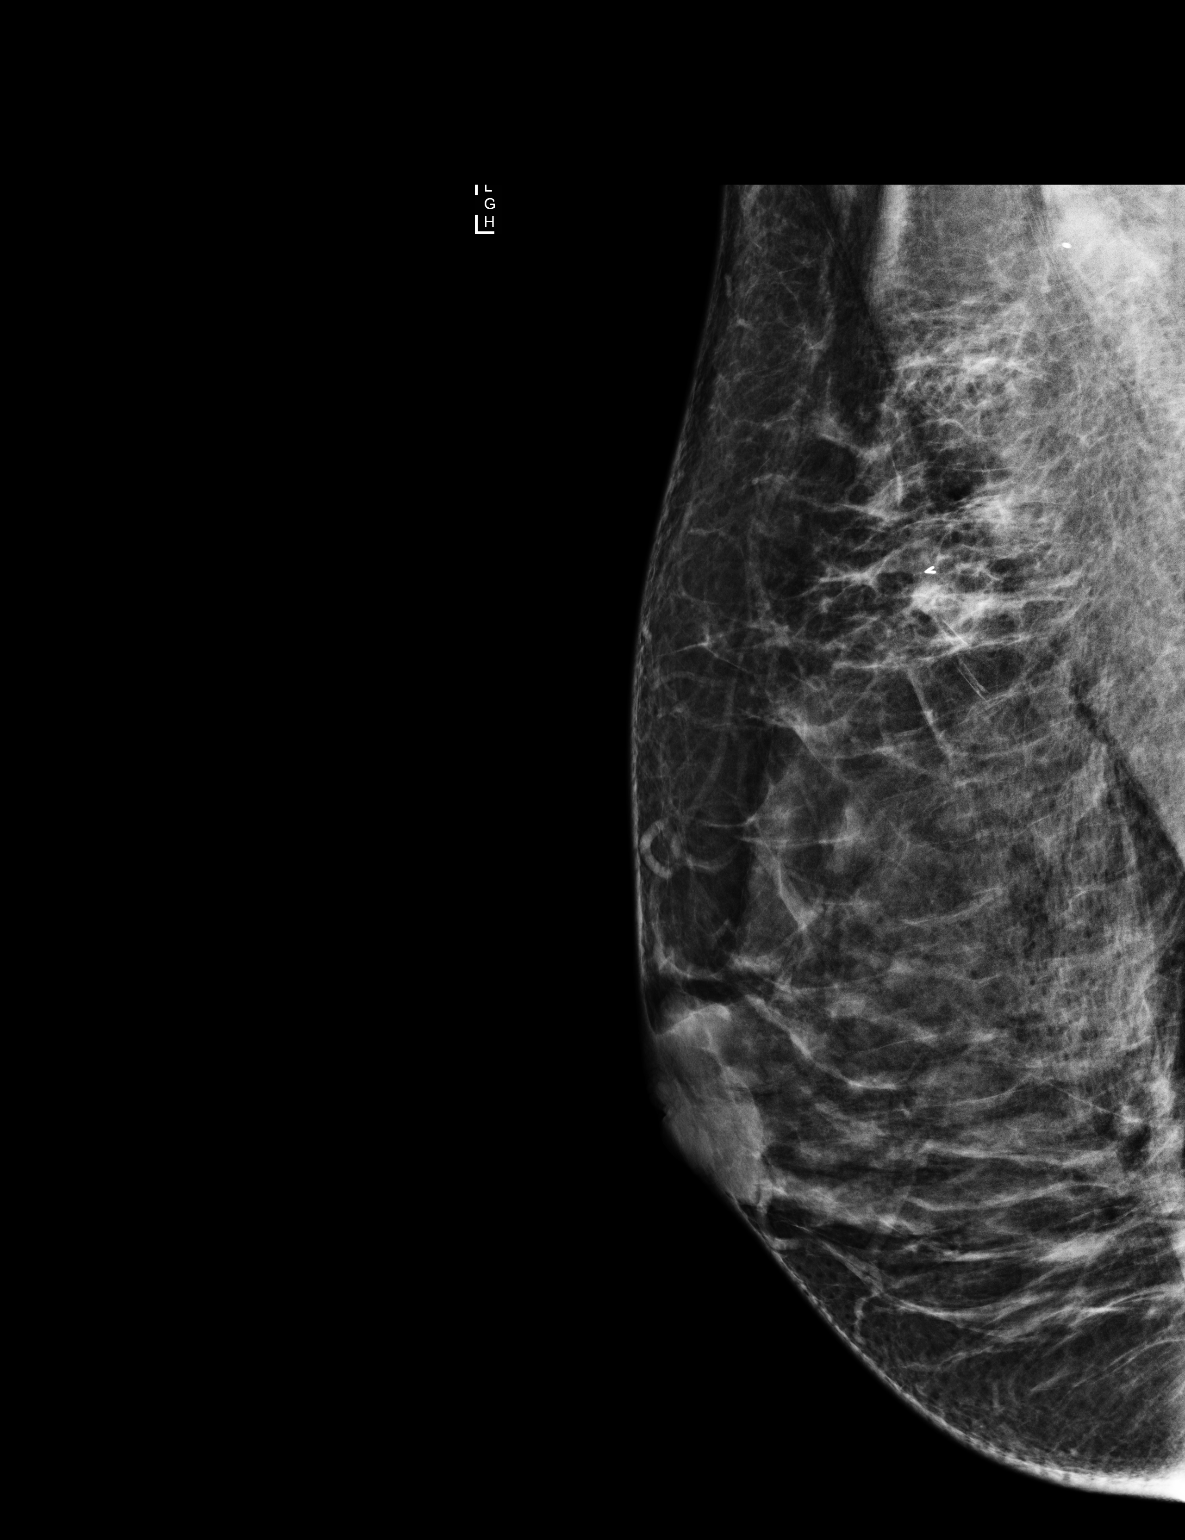

[3 of 3 positions shown; findings below may reference images not displayed]

FINDINGS: Mammographic images were obtained following ultrasound guided biopsy
of a right breast mass and right axillary lymph node. A ribbon
shaped clip is identified in the expected location in the upper
outer quadrant of the right breast in the region of the patient's
right breast mass. A second HydroMARK clip is identified within a
right axillary lymph node.
IMPRESSION: Ribbon shaped clip in the upper-outer right breast and HydroMARK
clip within a right axillary lymph node in the expected locations
after ultrasound-guided biopsies.

Final Assessment: Post Procedure Mammograms for Marker Placement

## 2019-01-04 IMAGING — US US BREAST BX W LOC DEV 1ST LESION IMG BX SPEC US GUIDE*R*
1 series · 13 of 18 positions shown · non-contrast
Comparison: Previous exam(s).

ADDENDUM:
Pathology revealed grade III invasive ductal carcinoma in the RIGHT
breast and a benign lymph node in the RIGHT axilla. This was found
to be concordant by Dr. Mayumi Holguin.

Pathology results were discussed with the patient by telephone. The
patient reported doing well after the biopsies with tenderness at
the sites. Post biopsy instructions and care were reviewed and
questions were answered. The patient was encouraged to call The
Surgical consultation has been arranged with Dr. Ristomatti Sevim at
[REDACTED] on July 19, 2017.
Pathology results reported by Lavelle Cintron RN, BSN on 07/16/2017.
CLINICAL DATA: 36-year-old female with right breast mass in mid
right axillary lymph node.
EXAM:
ULTRASOUND GUIDED RIGHT BREAST CORE NEEDLE BIOPSY

[Series 1: us breast bx w loc dev 1st lesion img bx spec us g · 0.06mm/px · 13 of 18 slices shown]
[im 1/18]
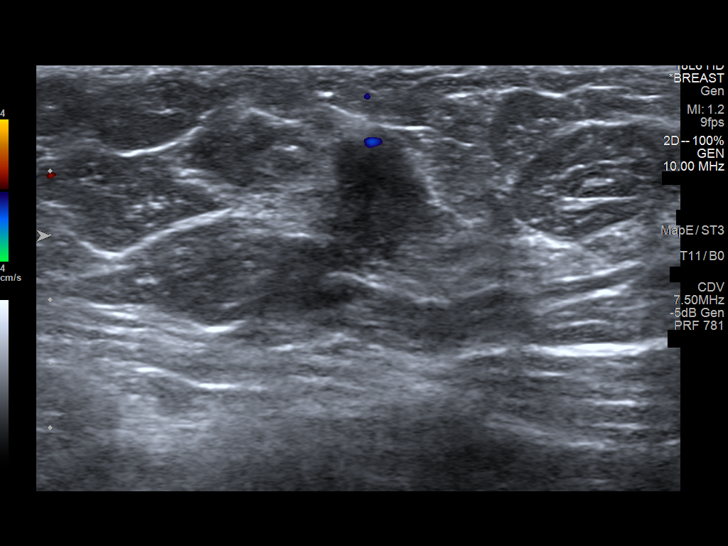
[im 3/18]
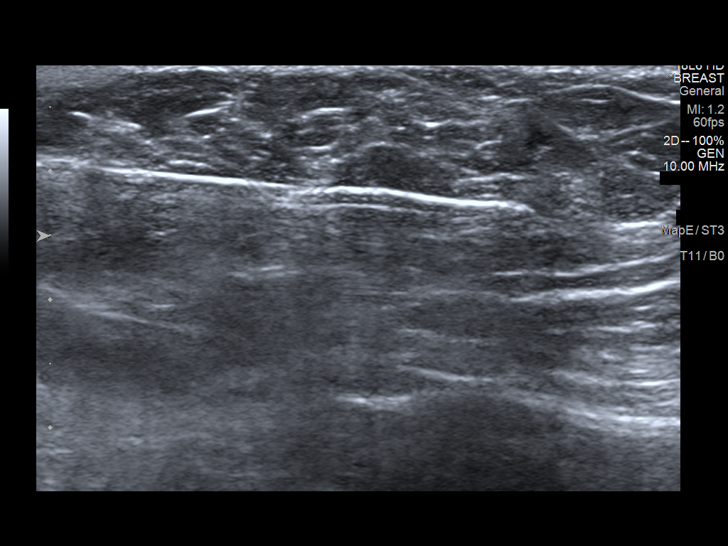
[im 4/18]
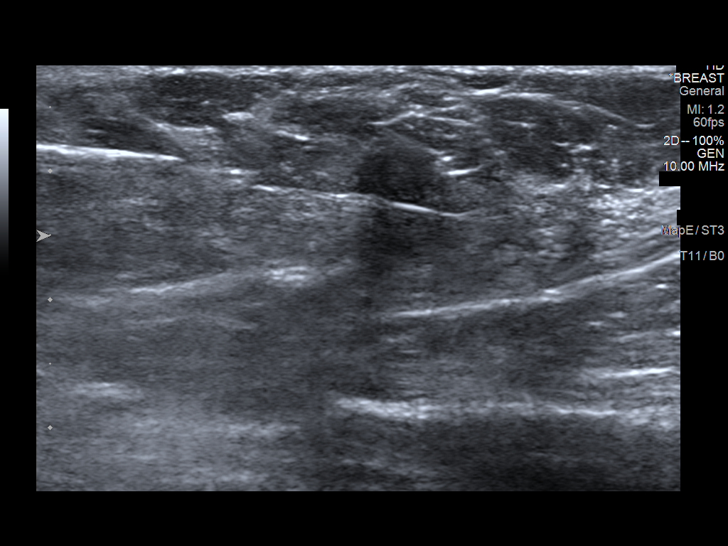
[im 5/18]
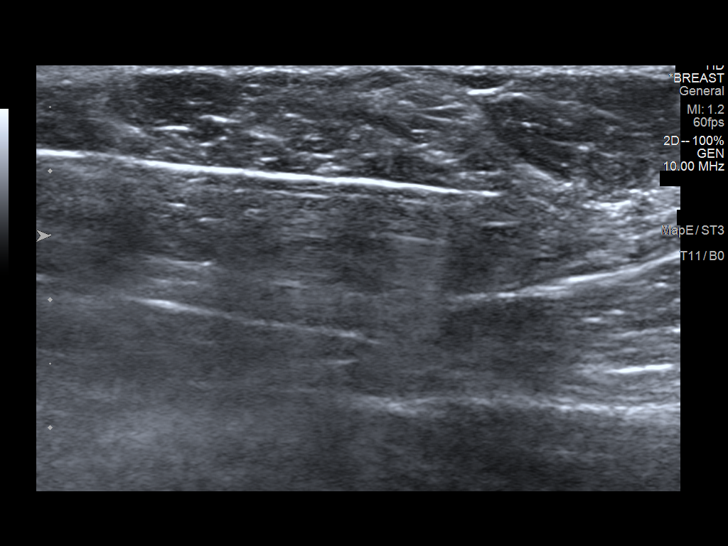
[im 7/18]
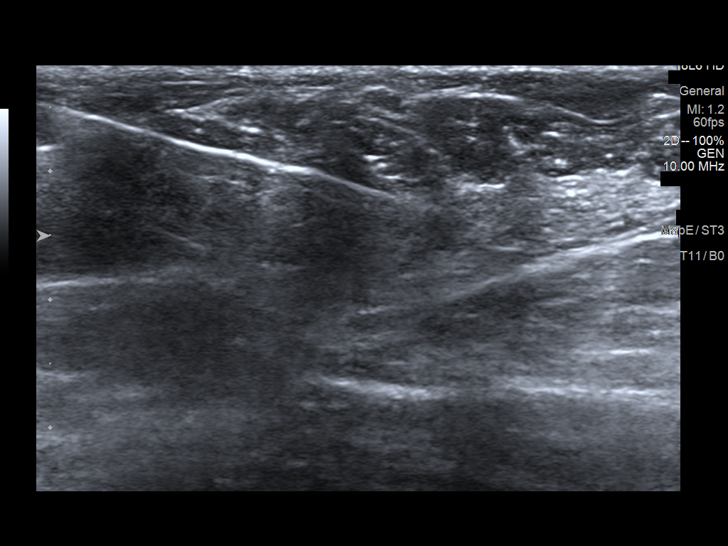
[im 8/18]
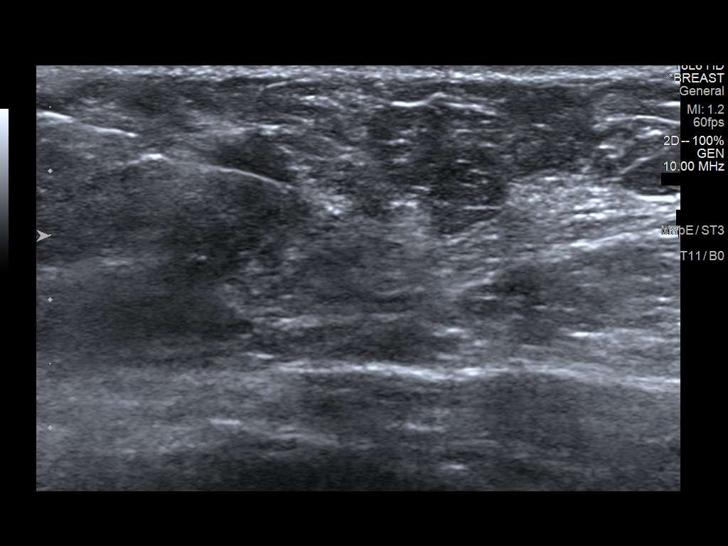
[im 10/18]
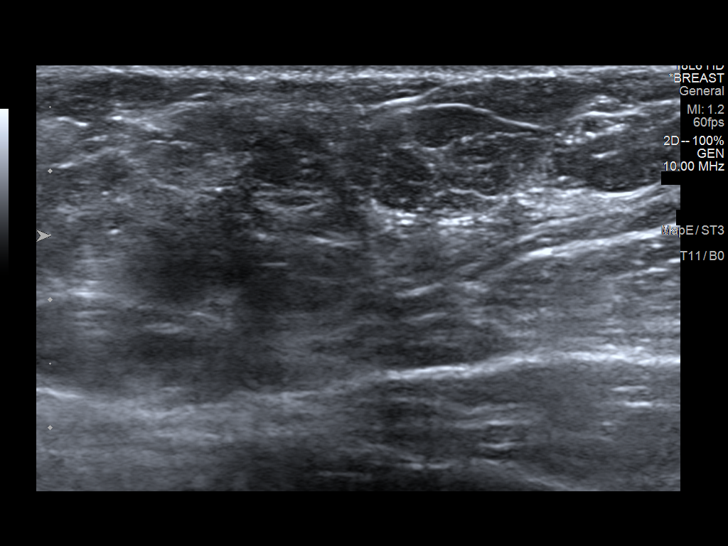
[im 11/18]
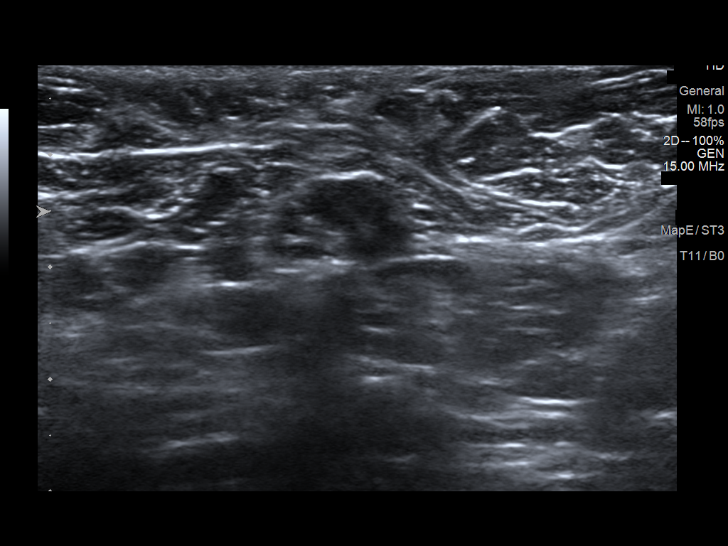
[im 12/18]
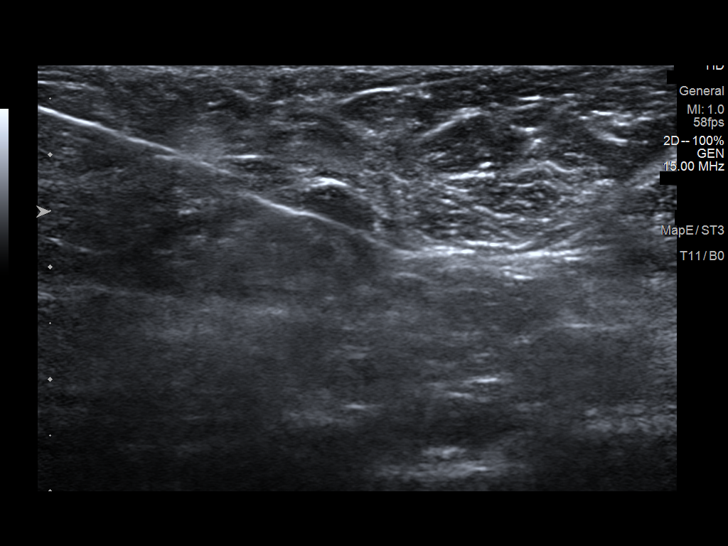
[im 14/18]
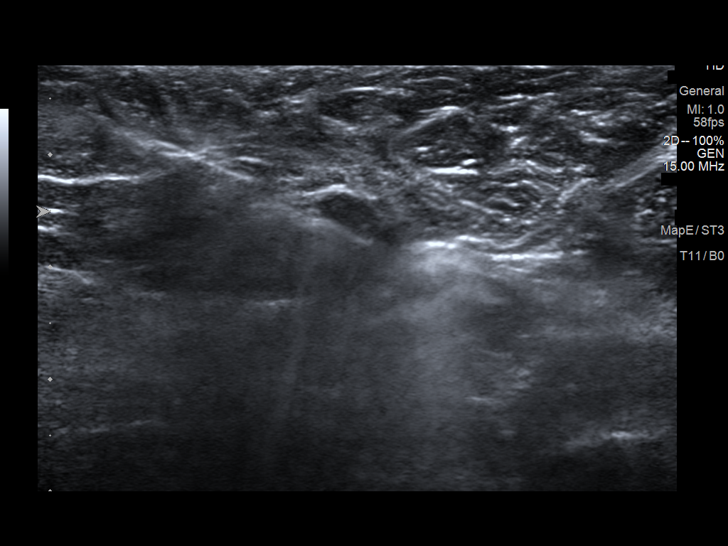
[im 15/18]
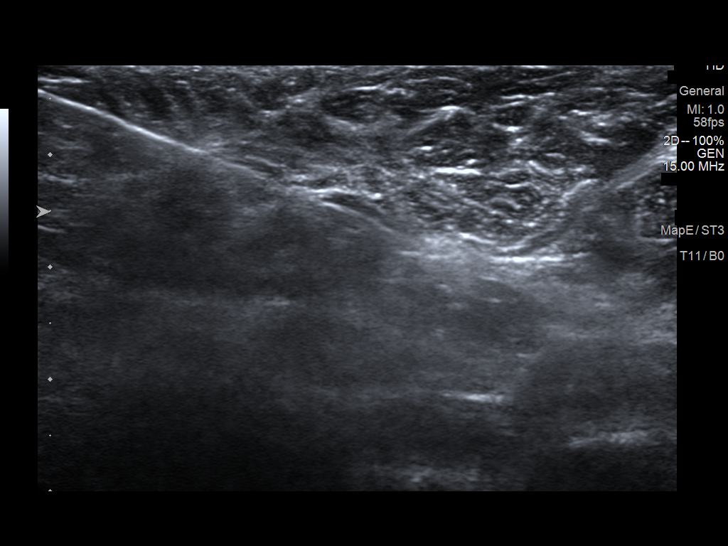
[im 16/18]
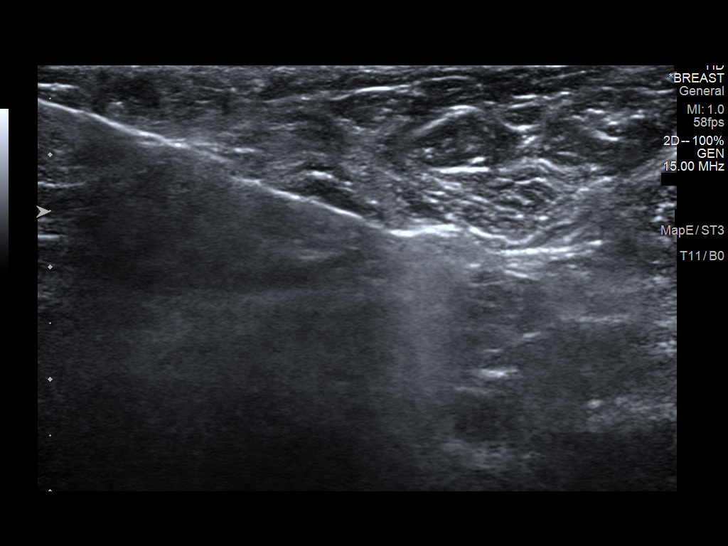
[im 18/18]
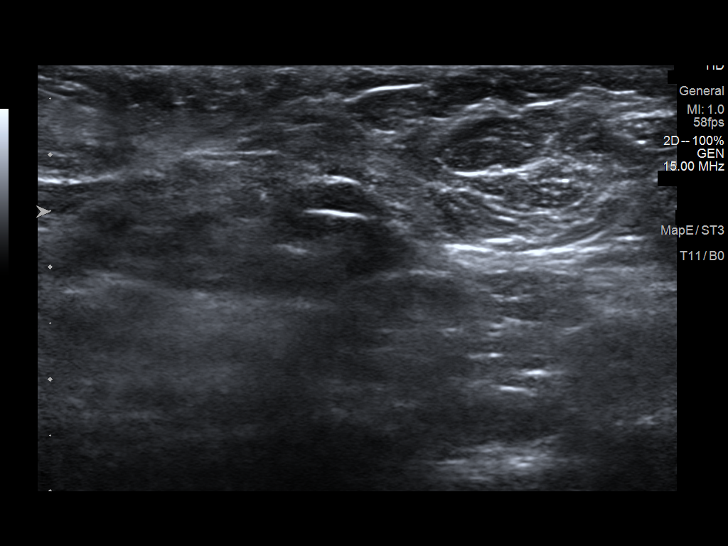

[13 of 18 positions shown; findings below may reference images not displayed]



Lesion quadrant: Upper-outer quadrant.

Using sterile technique and 1% Lidocaine as local anesthetic, under
direct ultrasound visualization, a 14 gauge Penafiel device was
used to perform biopsy of a right breast mass at the 10 o'clock
position 8 cm from the nipple. Using a lateral approach. At the
conclusion of the procedure a ribbon shaped tissue marker clip was
deployed into the biopsy cavity. Follow up 2 view mammogram was
performed and dictated separately.
IMPRESSION: Ultrasound guided biopsy of a right breast mass. No apparent
complications.

## 2019-02-09 ENCOUNTER — Telehealth: Payer: Self-pay

## 2019-02-10 NOTE — Telephone Encounter (Signed)
na

## 2019-02-19 NOTE — Assessment & Plan Note (Signed)
Right lumpectomy: IDC with DCIS, 1.4 cm, grade 3, DCIS focally involving posterior margin, 0/3 lymph nodes negative, ER 90%, PR 0%, HER-2 positive ratio 2.6, Ki-67 40% T1 CN 0 stage IA Tumor board did not recommend surgery for the positive posterior margin issue  Recommendation: 1.Adjuvant chemotherapy with [place] followed by Herceptin maintenance for a yearstarted 09/07/2018-12/21/2017 3.Adjuvant radiationstarted 01/19/2018 4. Followed by adjuvant antiestrogen therapy with tamoxifen that was started prior to surgery -------------------------------------------------------------------- Current Treatment: Tamoxifen 20 mg daily started 08/12/18 Tamoxifen Toxicities:  Breast Cancer Surveillance: Mammogram 09/07/2018: Benign RTC in 1 year for followup

## 2019-02-20 ENCOUNTER — Inpatient Hospital Stay: Payer: 59 | Attending: Hematology and Oncology | Admitting: Hematology and Oncology

## 2019-02-20 DIAGNOSIS — C50411 Malignant neoplasm of upper-outer quadrant of right female breast: Secondary | ICD-10-CM | POA: Diagnosis not present

## 2019-02-20 DIAGNOSIS — Z170 Estrogen receptor positive status [ER+]: Secondary | ICD-10-CM

## 2019-02-20 DIAGNOSIS — Z79810 Long term (current) use of selective estrogen receptor modulators (SERMs): Secondary | ICD-10-CM

## 2019-02-20 MED ORDER — VENLAFAXINE HCL ER 37.5 MG PO CP24: 37.5000 mg | ORAL_CAPSULE | Freq: Every day | ORAL | 6 refills | Status: DC

## 2019-02-20 NOTE — Progress Notes (Signed)
HEMATOLOGY-ONCOLOGY [facility] VISIT PROGRESS NOTE  I connected with Brianna [name] on 02/20/2019 at  1:45 PM EDT by Webex video conference and verified that I am speaking with the correct person using two identifiers.  I discussed the limitations, risks, security and privacy concerns of performing an evaluation and management service by Webex and the availability of in person appointments.  I also discussed with the patient that there may be a patient responsible charge related to this service. The patient expressed understanding and agreed to proceed.     CHIEF COMPLIANT: tamoxifen related emotional lability  INTERVAL HISTORY: Brianna [name] is a 38 year old on Tamoxifen who has been experiencing severe emotional outbursts. From major crying to severe anger. Shes working from home and has had 2 episodes of such anger related to work. Shes tearful about it and fears what it could do to her relationships. Shes also extremely anxious about corona virus and has a family member who had the infection.    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  The Hereditary Gene Panel offered by [facility] includes sequencing and/or deletion duplication testing of the following 46 genes: APC, ATM, AXIN2, BARD1, BMPR1A, BRCA1, BRCA2, BRIP1, CDH1, CDKN2A (p14ARF), CDKN2A (p16INK4a), CHEK2, CTNNA1, DICER1, EPCAM (Deletion/duplication testing only), GREM1  (promoter region deletion/duplication testing only), KIT, MEN1, MLH1, MSH2, MSH3, MSH6, MUTYH, NBN, NF1, NHTL1, PALB2, PDGFRA, PMS2, POLD1, POLE, PTEN, RAD50, RAD51C, RAD51D, SDHB, SDHC, SDHD, SMAD4, SMARCA4. STK11, TP53, TSC1, TSC2, and VHL.  The following genes were evaluated for sequence changes only: SDHA and HOXB13 c.251G>A variant only.  The report date is August 06, 2017.    08/11/2017 Surgery    Right lumpectomy: IDC with DCIS, 1.4 cm, grade 3, DCIS focally involving posterior margin, 0/3 lymph nodes negative, ER 90%, PR 0%, HER-2 positive ratio 2.6, Ki-67 40% T1 CN 0 stage IA    09/07/2017 - 12/21/2017 Adjuvant Chemotherapy    TCH x 6, then maintenance Herceptin x 1 year (Taxotere not given 4 cycles 5 and 6)    01/19/2018 - 03/04/2018 Radiation Therapy    Adjuvant radiation therapy    08/12/2018 -  Anti-estrogen oral therapy    Tamoxifen     Observations/Objective:  There were no vitals filed for this visit. There is no height or weight on file to calculate BMI.  I have reviewed the data as listed CMP Latest Ref Rng & Units 09/15/2018 08/12/2018 07/20/2018  Glucose 70 - 99 mg/dL 89 98 82  BUN 6 - 20 mg/dL '11 13 15  '$ Creatinine 0.44 - 1.00 mg/dL 0.96 0.92 0.90  Sodium 135 - 145 mmol/L 137 140 138  Potassium 3.5 - 5.1 mmol/L 5.4(H) 4.1 4.1  Chloride 98 - 111 mmol/L 108 109 108  CO2 22 - 32 mmol/L '22 24 26  '$ Calcium 8.9 - 10.3 mg/dL 9.0 8.9 9.4  Total Protein 6.5 - 8.1 g/dL - 6.7 7.2  Total Bilirubin 0.3 -  1.2 mg/dL - <0.2(L) <0.2(L)  Alkaline Phos 38 - 126 U/L - 76 69  AST 15 - 41 U/L - 13(L) 14(L)  ALT 0 - 44 U/L - 11 11    Lab Results  Component Value Date   WBC 4.2 09/15/2018   HGB 12.6 09/15/2018   HCT 39.5 09/15/2018   MCV 94.7 09/15/2018   PLT 208 09/15/2018   NEUTROABS 1.3 (L) 08/12/2018      Assessment Plan:  Malignant neoplasm of upper-outer quadrant of right breast in female, estrogen receptor positive ([place]) Right lumpectomy: IDC with DCIS, 1.4 cm, grade  3, DCIS focally involving posterior margin, 0/3 lymph nodes negative, ER 90%, PR 0%, HER-2 positive ratio 2.6, Ki-67 40% T1 CN 0 stage IA Tumor board did not recommend surgery for the positive posterior margin issue  Recommendation: 1.Adjuvant chemotherapy with [place] followed by Herceptin maintenance for a yearstarted 09/07/2018-12/21/2017 3.Adjuvant radiationstarted 01/19/2018 4. Followed by adjuvant antiestrogen therapy with tamoxifen that was started prior to surgery -------------------------------------------------------------------- Current Treatment: Tamoxifen 20 mg daily started 08/12/18 Tamoxifen Toxicities: 1. Severe emotional outbursts: Prescribed effexor XR Counseled her that it is Tamoxifen related and that she should take 10 mg Tamoxifen until her symptoms improve.  Breast Cancer Surveillance: Mammogram 09/07/2018: Benign RTC in 1 year for followup or sooner if needed.  I discussed the assessment and treatment plan with the patient. The patient was provided an opportunity to ask questions and all were answered. The patient agreed with the plan and demonstrated an understanding of the instructions. The patient was advised to call back or seek an in-person evaluation if the symptoms worsen or if the condition fails to improve as anticipated.   I provided 20 minutes of face-to-face Web Ex time during this encounter.   Harriette Ohara, MD  02/20/2019

## 2019-02-21 ENCOUNTER — Telehealth: Payer: Self-pay | Admitting: Hematology and Oncology

## 2019-02-21 NOTE — Telephone Encounter (Signed)
Scheduled appt for follow up appt in one year - sent letter in the mail.

## 2019-04-28 ENCOUNTER — Encounter: Payer: Self-pay | Admitting: Genetic Counselor

## 2019-08-13 ENCOUNTER — Other Ambulatory Visit: Payer: Self-pay | Admitting: Hematology and Oncology

## 2019-08-14 ENCOUNTER — Other Ambulatory Visit: Payer: Self-pay

## 2019-08-14 ENCOUNTER — Ambulatory Visit (INDEPENDENT_AMBULATORY_CARE_PROVIDER_SITE_OTHER): Payer: 59 | Admitting: Family Medicine

## 2019-08-14 ENCOUNTER — Encounter: Payer: Self-pay | Admitting: Family Medicine

## 2019-08-14 VITALS — BP 108/70 | HR 68 | Temp 97.5°F | Resp 12 | Ht 63.0 in | Wt 170.0 lb

## 2019-08-14 DIAGNOSIS — Z1329 Encounter for screening for other suspected endocrine disorder: Secondary | ICD-10-CM | POA: Diagnosis not present

## 2019-08-14 DIAGNOSIS — Z0000 Encounter for general adult medical examination without abnormal findings: Secondary | ICD-10-CM | POA: Diagnosis not present

## 2019-08-14 DIAGNOSIS — Z23 Encounter for immunization: Secondary | ICD-10-CM

## 2019-08-14 DIAGNOSIS — Z130 Encounter for screening for diseases of the blood and blood-forming organs and certain disorders involving the immune mechanism: Secondary | ICD-10-CM | POA: Diagnosis not present

## 2019-08-14 DIAGNOSIS — F320 Major depressive disorder, single episode, mild: Secondary | ICD-10-CM | POA: Diagnosis not present

## 2019-08-14 DIAGNOSIS — Z13220 Encounter for screening for lipoid disorders: Secondary | ICD-10-CM | POA: Diagnosis not present

## 2019-08-14 DIAGNOSIS — Z13228 Encounter for screening for other metabolic disorders: Secondary | ICD-10-CM | POA: Diagnosis not present

## 2019-08-14 LAB — BASIC METABOLIC PANEL
BUN: 10 mg/dL (ref 6–23)
CO2: 29 mEq/L (ref 19–32)
Calcium: 9.4 mg/dL (ref 8.4–10.5)
Chloride: 104 mEq/L (ref 96–112)
Creatinine, Ser: 0.81 mg/dL (ref 0.40–1.20)
GFR: 94.94 mL/min (ref >60.00)
Glucose, Bld: 102 mg/dL — ABNORMAL HIGH (ref 70–99)
Potassium: 4.1 mEq/L (ref 3.5–5.1)
Sodium: 140 mEq/L (ref 135–145)

## 2019-08-14 LAB — LIPID PANEL
Cholesterol: 201 mg/dL — ABNORMAL HIGH (ref 0–200)
HDL: 83.6 mg/dL (ref >39.00)
LDL Cholesterol: 105 mg/dL — ABNORMAL HIGH (ref 0–99)
NonHDL: 117.18
Total CHOL/HDL Ratio: 2
Triglycerides: 61 mg/dL (ref 0.0–149.0)
VLDL: 12.2 mg/dL (ref 0.0–40.0)

## 2019-08-14 LAB — HEMOGLOBIN A1C: Hgb A1c MFr Bld: 6.1 % (ref 4.6–6.5)

## 2019-08-14 MED ORDER — VENLAFAXINE HCL ER 75 MG PO CP24: 75.0000 mg | ORAL_CAPSULE | Freq: Every day | ORAL | 1 refills | Status: DC

## 2019-08-14 NOTE — Patient Instructions (Addendum)
A few things to remember from today's visit:   Routine general medical examination at a health care facility  Screening for lipoid disorders - Plan: Basic metabolic panel, Hemoglobin A1c  Screening for endocrine, metabolic and immunity disorder - Plan: Lipid panel  I sent Effexor 75 mg to your pharmacy, try for 4 to 5 weeks and let me know if you notice major difference in your mood with, if not we can go back to 37.5 mg daily.  Continue monitoring left area above collarbone for changes.  At least 150 minutes of moderate exercise per week, daily brisk walking for 15-30 min is a good exercise option. Healthy diet low in saturated (animal) fats and sweets and consisting of fresh fruits and vegetables, lean meats such as fish and white chicken and whole grains.  These are some of recommendations for screening depending of age and risk factors:   - Vaccines:  Tdap vaccine every 10 years.  Shingles vaccine recommended at age 71, could be given after 39 years of age but not sure about insurance coverage.   Pneumonia vaccines:  Prevnar 13 at 65 and Pneumovax at 103. Sometimes Pneumovax is giving earlier if history of smoking, lung disease,diabetes,kidney disease among some.    Screening for diabetes at age 48 and every 3 years.  Cervical cancer prevention:  Pap smear starts at 39 years of age and continues periodically until 39 years old in low risk women. Pap smear every 3 years between 59 and 25 years old. Pap smear every 3-5 years between women 22 and older if pap smear negative and HPV screening negative.   Colon cancer screening: starts at 39 years old until 39 years old.  Cholesterol disorder screening at age 74 and every 3 years.  Also recommended:  1. Dental visit- Brush and floss your teeth twice daily; visit your dentist twice a year. 2. Eye doctor- Get an eye exam at least every 2 years. 3. Helmet use- Always wear a helmet when riding a bicycle, motorcycle,  rollerblading or skateboarding. 4. Safe sex- If you may be exposed to sexually transmitted infections, use a condom. 5. Seat belts- Seat belts can save your live; always wear one. 6. Smoke/Carbon Monoxide detectors- These detectors need to be installed on the appropriate level of your home. Replace batteries at least once a year. 7. Skin cancer- When out in the sun please cover up and use sunscreen 15 SPF or higher. 8. Violence- If anyone is threatening or hurting you, please tell your healthcare provider.  9. Drink alcohol in moderation- Limit alcohol intake to one drink or less per day. Never drink and drive.

## 2019-08-14 NOTE — Progress Notes (Signed)
HPI:   Brianna [name] is a 38 y.o. female, who is here today for her routine physical.  Last CPE: Gyn preventive visit in 2018.  Regular exercise 3 or more time per week: She walks a few times per week, has not done [name] for the apst 3 weeks because her husband fractured his foot. Following a healthy diet: Yes. She lives with her husband.  Chronic medical problems: Right breast cancer diagnosed in 06/2017.  She underwent right lumpectomy in 07/2017. Completed chemotherapy, from 09/11/2017 and 12/12/2017,radiation completed 02/2018. Currently she is on tamoxifen almost a year ago. Recently started on Effexor XR 37.5 mg due to depressed mood, thought to be related with tamoxifen.  She follows with oncology regularly, Dr. Lindi Adie.  Pap smear: Last week, 08/11/19  Immunization History  Administered Date(s) Administered  . Tdap 08/14/2019    Mammogram: 08/2018. Colonoscopy: She has one about 8 years ago,she was recommended to have another one 5 years later. She would like to schedule it. Dr Michail Sermon. DEXA: N/A  Concerns today: Occasional tenderness in left supraclavicular area for a few days, it also seems more prominent that right one. She has not identified exacerbating or alleviating factors. Negative for rash or skin changes. No hx of trauma. Problem seems to be stable.   Review of Systems  Constitutional: Negative for activity change, appetite change, fatigue and fever.  HENT: Negative for hearing loss, mouth sores, trouble swallowing and voice change.   Eyes: Negative for redness and visual disturbance.  Respiratory: Negative for cough, shortness of breath and wheezing.   Cardiovascular: Negative for chest pain, palpitations and leg swelling.  Gastrointestinal: Negative for abdominal pain, nausea and vomiting.       No changes in bowel habits.  Endocrine: Negative for cold intolerance, heat intolerance, polydipsia, polyphagia and polyuria.  Genitourinary: Negative  for decreased urine volume, dysuria, hematuria, vaginal bleeding and vaginal discharge.  Musculoskeletal: Negative for gait problem and myalgias.  Skin: Negative for color change and rash.  Neurological: Negative for syncope, weakness and headaches.  Psychiatric/Behavioral: Negative for confusion and hallucinations.  All other systems reviewed and are negative.   Current Outpatient Medications on File Prior to Visit  Medication Sig Dispense Refill  . Dapsone 7.5 % GEL Apply topically.    . Sarecycline HCl 100 MG TABS Take one tablet by mouth daily    . Sulfacetamide Sodium-Sulfur (CLENIA FOAMING WASH) 10-5 % EMUL Apply 1 application topically 2 (two) times daily.     . tamoxifen (NOLVADEX) 20 MG tablet TAKE 1 TABLET BY MOUTH EVERY DAY 90 tablet 0  . tretinoin (RETIN-A) 0.025 % gel Apply 1 application topically at bedtime.      Current Facility-Administered Medications on File Prior to Visit  Medication Dose Route Frequency Provider Last Rate Last Dose  . sodium chloride flush (NS) 0.9 % injection 10 mL  10 mL Intracatheter PRN Nicholas Lose, MD   10 mL at 07/20/18 1536     Past Medical History:  Diagnosis Date  . Acne   . Depression   . GERD (gastroesophageal reflux disease)   . History of concussion 08/2016  . IBS (irritable bowel syndrome)    no current med.  . 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 Personal history of chemotherapy   . Personal history of radiation therapy   . Pre-diabetes     Past Surgical History:  Procedure Laterality Date  . BREAST LUMPECTOMY Right 2018  . BREAST LUMPECTOMY  WITH RADIOACTIVE SEED AND SENTINEL LYMPH NODE BIOPSY Right 08/11/2017   Procedure: RIGHT BREAST LUMPECTOMY WITH RADIOACTIVE SEED AND RIGHT SENTINEL LYMPH NODE BIOPSY;  Surgeon: Donnie Mesa, MD;  Location: [facility];  Service: General;  Laterality: Right;  ERAS PATHWAY  . PORT-A-CATH REMOVAL N/A 09/15/2018   Procedure: REMOVAL PORT-A-CATH;   Surgeon: Donnie Mesa, MD;  Location: [facility];  Service: General;  Laterality: N/A;  . PORTACATH PLACEMENT Left 08/11/2017   Procedure: INSERTION PORT-A-CATH WITH ULTRA SOUND;  Surgeon: Donnie Mesa, MD;  Location: [facility];  Service: General;  Laterality: Left;  . WISDOM TOOTH EXTRACTION    . WOUND EXPLORATION Right 04/19/2018   Procedure: RIGHT AXILLARY WOUND EXPLORATION;  Surgeon: Donnie Mesa, MD;  Location: [facility];  Service: General;  Laterality: Right;    No Known Allergies  Family History  Problem Relation Age of Onset  . Heart disease Maternal Grandmother        d.53  . Stroke Maternal Grandfather   . Diabetes Maternal Grandfather   . Hyperlipidemia Mother   . Diabetes Maternal Uncle   . Heart attack Other   . Obesity Other   . COPD Other   . Asthma Other   . Breast cancer Other 12       Maternal grandfather's sister    Social History   Socioeconomic History  . Marital status: Married    Spouse name: Not on file  . Number of children: Not on file  . Years of education: Not on file  . Highest education level: Not on file  Occupational History  . Not on file  Social Needs  . Financial resource strain: Not on file  . Food insecurity    Worry: Not on file    Inability: Not on file  . Transportation needs    Medical: Not on file    Non-medical: Not on file  Tobacco Use  . Smoking status: Never Smoker  . Smokeless tobacco: Never Used  Substance and Sexual Activity  . Alcohol use: Yes    Comment: occasionally  . Drug use: No  . Sexual activity: Yes    Birth control/protection: None  Lifestyle  . Physical activity    Days per week: Not on file    Minutes per session: Not on file  . Stress: Not on file  Relationships  . Social Herbalist on phone: Not on file    Gets together: Not on file    Attends religious service: Not on file    Active member of club or organization: Not on file    Attends meetings of  clubs or organizations: Not on file    Relationship status: Not on file  Other Topics Concern  . Not on file  Social History Narrative  . Not on file    Vitals:   08/14/19 1014  BP: 108/70  Pulse: 68  Resp: 12  Temp: (!) 97.5 F (36.4 C)  SpO2: 96%   Body mass index is 30.11 kg/m.   Wt Readings from Last 3 Encounters:  08/14/19 170 lb (77.1 kg)  09/15/18 167 lb (75.8 kg)  08/12/18 170 lb (77.1 kg)   Physical Exam  Nursing note and vitals reviewed. Constitutional: She is oriented to person, place, and time. She appears well-developed. No distress.  HENT:  Head: Normocephalic and atraumatic.  Right Ear: Hearing, tympanic membrane, external ear and ear canal normal.  Left Ear: Hearing, tympanic membrane, external ear  and ear canal normal.  Mouth/Throat: Uvula is midline, oropharynx is clear and moist and mucous membranes are normal.  Eyes: Pupils are equal, round, and reactive to light. Conjunctivae and EOM are normal.  Neck: No tracheal deviation present. No thyromegaly present.  Cardiovascular: Normal rate and regular rhythm.  No murmur heard. Pulses:      Dorsalis pedis pulses are 2+ on the right side and 2+ on the left side.  Respiratory: Effort normal and breath sounds normal. No respiratory distress.  GI: Soft. She exhibits no mass. There is no hepatomegaly. There is no abdominal tenderness.  Genitourinary:    Genitourinary Comments: Deferred to gyn.   Musculoskeletal:        General: No edema.     Comments: No major deformity or signs of synovitis appreciated. Left supraventricular fossa: No masses, mild tenderness upon deep palpation.  Lymphadenopathy:    She has no cervical adenopathy.       Right: No supraclavicular adenopathy present.       Left: No supraclavicular adenopathy present.  Neurological: She is alert and oriented to person, place, and time. She has normal strength. No cranial nerve deficit. Coordination and gait normal.  Reflex Scores:       Bicep reflexes are 2+ on the right side and 2+ on the left side.      Patellar reflexes are 2+ on the right side and 2+ on the left side. Skin: Skin is warm. No rash noted. No erythema.  Psychiatric: Her mood appears anxious. Cognition and memory are normal.  Well groomed, good eye contact.   ASSESSMENT AND PLAN:  Brianna [name] was here today annual physical examination.   Orders Placed This Encounter  Procedures  . Tdap vaccine greater than or equal to 7yo IM  . Basic metabolic panel  . Hemoglobin A1c  . Lipid panel    Lab Results  Component Value Date   CHOL 201 (H) 08/14/2019   HDL 83.60 08/14/2019   LDLCALC 105 (H) 08/14/2019   TRIG 61.0 08/14/2019   CHOLHDL 2 08/14/2019    Lab Results  Component Value Date   HGBA1C 6.1 08/14/2019   Lab Results  Component Value Date   CREATININE 0.81 08/14/2019   BUN 10 08/14/2019   NA 140 08/14/2019   K 4.1 08/14/2019   CL 104 08/14/2019   CO2 29 08/14/2019    Routine general medical examination at a health care facility We discussed the importance of regular physical activity and healthy diet for prevention of chronic illness and/or complications. Preventive guidelines reviewed. Vaccination updated. Continue following with gyn for her female preventive care. Next CPE in a year.  Screening for lipoid disorders -     Basic metabolic panel -     Hemoglobin A1c  Screening for endocrine, metabolic and immunity disorder -     Lipid panel  Need for Tdap vaccination -     Tdap vaccine greater than or equal to 7yo IM  Depression, major, single episode, mild (HCC) Improved but still having symptoms around beginning of menstrual cycle. She agrees with trying a higher dose of Effexor for 4-6 weeks, side effects discussed. She will let me know if increasing Effexor dose helps with symptoms,otherwise she can go back to lower dose. Instructed about warning signs.  -     venlafaxine XR (EFFEXOR XR) 75 MG 24 hr capsule; Take  1 capsule (75 mg total) by mouth daily with breakfast.  In regard to supraclavicular discomfort, I do  not appreciate masses or adenopathies. Recommend monitoring for changes. She can apply OTC icy hot. Instructed about warning signs.   Return in 1 year (on 08/13/2020) for CPE.    Melodi Happel G. [name], MD  [facility]. [place] office.

## 2019-08-17 ENCOUNTER — Encounter: Payer: Self-pay | Admitting: Family Medicine

## 2019-09-11 ENCOUNTER — Other Ambulatory Visit: Payer: Self-pay

## 2019-09-11 ENCOUNTER — Ambulatory Visit
Admission: RE | Admit: 2019-09-11 | Discharge: 2019-09-11 | Disposition: A | Discharge status: Home / Self Care | Payer: 59 | Source: Ambulatory Visit | Attending: Hematology and Oncology | Admitting: Hematology and Oncology

## 2019-09-11 DIAGNOSIS — C50411 Malignant neoplasm of upper-outer quadrant of right female breast: Secondary | ICD-10-CM

## 2019-09-11 DIAGNOSIS — Z170 Estrogen receptor positive status [ER+]: Secondary | ICD-10-CM

## 2019-10-18 ENCOUNTER — Other Ambulatory Visit: Payer: Self-pay | Admitting: Family Medicine

## 2019-10-18 DIAGNOSIS — F320 Major depressive disorder, single episode, mild: Secondary | ICD-10-CM

## 2019-11-12 ENCOUNTER — Other Ambulatory Visit: Payer: Self-pay | Admitting: Hematology and Oncology

## 2019-11-13 ENCOUNTER — Other Ambulatory Visit: Payer: Self-pay | Admitting: Hematology and Oncology

## 2019-11-13 DIAGNOSIS — F320 Major depressive disorder, single episode, mild: Secondary | ICD-10-CM

## 2019-11-13 MED ORDER — TAMOXIFEN CITRATE 20 MG PO TABS: 20.0000 mg | ORAL_TABLET | Freq: Every day | ORAL | 3 refills | Status: DC

## 2019-11-13 MED ORDER — VENLAFAXINE HCL ER 75 MG PO CP24: 75.0000 mg | ORAL_CAPSULE | Freq: Every day | ORAL | 1 refills | Status: DC

## 2019-11-13 NOTE — Progress Notes (Signed)
Effexor and tamoxifen prescriptions have been sent to her pharmacy

## 2019-11-14 NOTE — Telephone Encounter (Signed)
Please refuse as you have taken care of this

## 2019-11-15 ENCOUNTER — Ambulatory Visit: Payer: 59 | Attending: Internal Medicine

## 2019-11-15 DIAGNOSIS — Z20822 Contact with and (suspected) exposure to covid-19: Secondary | ICD-10-CM

## 2019-11-17 LAB — NOVEL CORONAVIRUS, NAA: SARS-CoV-2, NAA: NOT DETECTED

## 2020-02-11 ENCOUNTER — Other Ambulatory Visit: Payer: Self-pay | Admitting: Hematology and Oncology

## 2020-02-16 ENCOUNTER — Telehealth: Payer: Self-pay | Admitting: Hematology and Oncology

## 2020-02-16 NOTE — Telephone Encounter (Signed)
Rescheduled 03/30 appointment due to provider being on pal, patient has been called regarding rescheduled appointments.

## 2020-02-21 ENCOUNTER — Inpatient Hospital Stay: Payer: 59 | Admitting: Hematology and Oncology

## 2020-02-21 ENCOUNTER — Ambulatory Visit: Payer: 59 | Attending: Internal Medicine

## 2020-02-21 DIAGNOSIS — Z20822 Contact with and (suspected) exposure to covid-19: Secondary | ICD-10-CM

## 2020-02-22 LAB — NOVEL CORONAVIRUS, NAA: SARS-CoV-2, NAA: NOT DETECTED

## 2020-02-27 NOTE — Progress Notes (Signed)
Patient Care Team: [name], [name], MD as PCP - General (Family Medicine) Nicholas Lose, MD as Consulting Physician (Hematology and Oncology) Donnie Mesa, MD as Consulting Physician (General Surgery) Kyung Rudd, MD as Consulting Physician (Radiation Oncology) Gardenia Phlegm, NP as Nurse Practitioner (Hematology and Oncology)  DIAGNOSIS:    ICD-10-CM   1. Malignant neoplasm of upper-outer quadrant of right breast in female, estrogen receptor positive ([place])  C50.411    Z17.0     SUMMARY OF ONCOLOGIC HISTORY: Oncology History  Malignant neoplasm of upper-outer quadrant of right breast in female, estrogen receptor positive ([place])  07/15/2017 Initial Diagnosis   Patient palpated a week mass in the right upper outer quadrant breast near the axilla in March 2018 mammogram revealed irregular spiculated mass 1.4 cm, by ultrasound measured 1.1 cm with a suspicious right axillary lymph node; biopsy revealed IDC grade 3, lymph node benign, ER 90%, PR 0%, Ki-67 40%, HER-2 positive ratio 2.6, T1c N0 stage IA, AJCC 8 clinical stage   08/06/2017 Genetic Testing   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  The Hereditary Gene Panel offered by [facility] includes sequencing and/or deletion duplication testing of the following 46 genes: APC, ATM, AXIN2, BARD1, BMPR1A, BRCA1, BRCA2, BRIP1, CDH1, CDKN2A (p14ARF), CDKN2A (p16INK4a), CHEK2, CTNNA1, DICER1, EPCAM (Deletion/duplication testing only), GREM1 (promoter region deletion/duplication testing only), KIT, MEN1, MLH1, MSH2, MSH3, MSH6, MUTYH, NBN, NF1, NHTL1, PALB2, PDGFRA, PMS2, POLD1, POLE, PTEN, RAD50, RAD51C, RAD51D, SDHB, SDHC, SDHD, SMAD4, SMARCA4. STK11, TP53, TSC1, TSC2, and VHL.  The following genes were evaluated for sequence changes only: SDHA and HOXB13 c.251G>A variant only.   The report date is August 06, 2017.  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  The updated report date is April 26, 2019.    08/11/2017 Surgery   Right lumpectomy: IDC with DCIS, 1.4 cm, grade 3, DCIS focally involving posterior margin, 0/3 lymph nodes negative, ER 90%, PR 0%, HER-2 positive ratio 2.6, Ki-67 40% T1 CN 0 stage IA   09/07/2017 - 12/21/2017 Adjuvant Chemotherapy   TCH x 6, then maintenance Herceptin x 1 year (Taxotere not given 4 cycles 5 and 6)   01/19/2018 - 03/04/2018 Radiation Therapy   Adjuvant radiation therapy   08/12/2018 -  Anti-estrogen oral therapy   Tamoxifen     CHIEF COMPLIANT: Follow-up of right breast cancer on tamoxifen  INTERVAL HISTORY: Brianna [name] is a 39 y.o. with above-mentioned history of right breast cancer treated with lumpectomy, adjuvant chemotherapy, radiation, and who is currently on antiestrogen therapy with tamoxifen. Mammogram on 09/11/19 showed no evidence of malignancy bilaterally. She presents to the clinic today for annual follow-up.   ALLERGIES:  has No Known Allergies.  MEDICATIONS:  Current Outpatient Medications  Medication Sig Dispense Refill  . Dapsone 7.5 % GEL Apply topically.    . Sarecycline HCl 100 MG TABS Take one tablet by mouth daily    . Sulfacetamide Sodium-Sulfur (CLENIA FOAMING WASH) 10-5 % EMUL Apply 1 application topically 2 (two) times daily.     . tamoxifen (NOLVADEX) 20 MG tablet Take 1 tablet (20 mg total) by mouth daily. 90 tablet 3  . tretinoin (RETIN-A) 0.025 % gel Apply 1 application topically at bedtime.     Marland Kitchen venlafaxine XR (EFFEXOR-XR) 37.5 MG 24 hr capsule TAKE 1 CAPSULE (37.5 MG TOTAL) BY MOUTH  DAILY WITH BREAKFAST. 30 capsule 2  . venlafaxine XR (EFFEXOR-XR) 75 MG 24 hr capsule Take 1 capsule (75 mg total) by mouth daily with breakfast. 30 capsule 1   No current facility-administered medications for this visit.   Facility-Administered Medications Ordered in Other Visits    Medication Dose Route Frequency Provider Last Rate Last Admin  . sodium chloride flush (NS) 0.9 % injection 10 mL  10 mL Intracatheter PRN Nicholas Lose, MD   10 mL at 07/20/18 1536    PHYSICAL EXAMINATION: ECOG PERFORMANCE STATUS: 1 - Symptomatic but completely ambulatory  There were no vitals filed for this visit. There were no vitals filed for this visit.  BREAST: No palpable masses or nodules in either right or left breasts. No palpable axillary supraclavicular or infraclavicular adenopathy no breast tenderness or nipple discharge. (exam performed in the presence of a chaperone)  LABORATORY DATA:  I have reviewed the data as listed CMP Latest Ref Rng & Units 08/14/2019 09/15/2018 08/12/2018  Glucose 70 - 99 mg/dL 102(H) 89 98  BUN 6 - 23 mg/dL '10 11 13  '$ Creatinine 0.40 - 1.20 mg/dL 0.81 0.96 0.92  Sodium 135 - 145 mEq/L 140 137 140  Potassium 3.5 - 5.1 mEq/L 4.1 5.4(H) 4.1  Chloride 96 - 112 mEq/L 104 108 109  CO2 19 - 32 mEq/L '29 22 24  '$ Calcium 8.4 - 10.5 mg/dL 9.4 9.0 8.9  Total Protein 6.5 - 8.1 g/dL - - 6.7  Total Bilirubin 0.3 - 1.2 mg/dL - - <0.2(L)  Alkaline Phos 38 - 126 U/L - - 76  AST 15 - 41 U/L - - 13(L)  ALT 0 - 44 U/L - - 11    Lab Results  Component Value Date   WBC 4.2 09/15/2018   HGB 12.6 09/15/2018   HCT 39.5 09/15/2018   MCV 94.7 09/15/2018   PLT 208 09/15/2018   NEUTROABS 1.3 (L) 08/12/2018    ASSESSMENT & PLAN:  Malignant neoplasm of upper-outer quadrant of right breast in female, estrogen receptor positive ([place]) Right lumpectomy: IDC with DCIS, 1.4 cm, grade 3, DCIS focally involving posterior margin, 0/3 lymph nodes negative, ER 90%, PR 0%, HER-2 positive ratio 2.6, Ki-67 40% T1 CN 0 stage IA Tumor board did not recommend surgery for the positive posterior margin issue  Recommendation: 1.Adjuvant chemotherapy with [place] followed by Herceptin maintenance for a yearstarted 09/07/2018-12/21/2017 3.Adjuvant radiationstarted 01/19/2018 4.  Followed by adjuvant antiestrogen therapy with tamoxifen that was started prior to surgery -------------------------------------------------------------------- Current Treatment: Tamoxifen 20 mg daily started 08/12/18 Tamoxifen Toxicities: 1. Severe emotional outbursts: Currently on effexor XR 75 mg and she is doing very well on that. Hot flashes have also improved dramatically.  Breast Cancer Surveillance:  Mammogram 09/11/2019: Benign, breast density category C Breast exam 02/28/2020: Benign RTC in 1 year for followup or sooner if needed.   No orders of the defined types were placed in this encounter.  The patient has a good understanding of the overall plan. she agrees with it. she will call with any problems that may develop before the next visit here.  Total time spent: 20 mins including face to face time and time spent for planning, charting and coordination of care  Nicholas Lose, MD 02/28/2020  I, Cloyde Reams Dorshimer, am acting as scribe for Dr. Nicholas Lose.  I have reviewed the above documentation for accuracy and completeness, and I agree with the above.

## 2020-02-28 ENCOUNTER — Other Ambulatory Visit: Payer: Self-pay

## 2020-02-28 ENCOUNTER — Inpatient Hospital Stay: Payer: 59 | Attending: Hematology and Oncology | Admitting: Hematology and Oncology

## 2020-02-28 DIAGNOSIS — Z79810 Long term (current) use of selective estrogen receptor modulators (SERMs): Secondary | ICD-10-CM | POA: Diagnosis not present

## 2020-02-28 DIAGNOSIS — F320 Major depressive disorder, single episode, mild: Secondary | ICD-10-CM | POA: Diagnosis not present

## 2020-02-28 DIAGNOSIS — Z9221 Personal history of antineoplastic chemotherapy: Secondary | ICD-10-CM | POA: Diagnosis not present

## 2020-02-28 DIAGNOSIS — Z170 Estrogen receptor positive status [ER+]: Secondary | ICD-10-CM | POA: Insufficient documentation

## 2020-02-28 DIAGNOSIS — Z79899 Other long term (current) drug therapy: Secondary | ICD-10-CM | POA: Diagnosis not present

## 2020-02-28 DIAGNOSIS — C50411 Malignant neoplasm of upper-outer quadrant of right female breast: Secondary | ICD-10-CM | POA: Insufficient documentation

## 2020-02-28 DIAGNOSIS — Z923 Personal history of irradiation: Secondary | ICD-10-CM | POA: Diagnosis not present

## 2020-02-28 MED ORDER — VENLAFAXINE HCL ER 75 MG PO CP24: 75.0000 mg | ORAL_CAPSULE | Freq: Every day | ORAL | 3 refills | Status: DC

## 2020-02-28 MED ORDER — TAMOXIFEN CITRATE 20 MG PO TABS: 20.0000 mg | ORAL_TABLET | Freq: Every day | ORAL | 3 refills | Status: DC

## 2020-02-28 NOTE — Assessment & Plan Note (Signed)
Right lumpectomy: IDC with DCIS, 1.4 cm, grade 3, DCIS focally involving posterior margin, 0/3 lymph nodes negative, ER 90%, PR 0%, HER-2 positive ratio 2.6, Ki-67 40% T1 CN 0 stage IA Tumor board did not recommend surgery for the positive posterior margin issue  Recommendation: 1.Adjuvant chemotherapy with [place] followed by Herceptin maintenance for a yearstarted 09/07/2018-12/21/2017 3.Adjuvant radiationstarted 01/19/2018 4. Followed by adjuvant antiestrogen therapy with tamoxifen that was started prior to surgery -------------------------------------------------------------------- Current Treatment: Tamoxifen 20 mg daily started 08/12/18 Tamoxifen Toxicities: 1. Severe emotional outbursts: Currently on effexor XR Counseled her that it is Tamoxifen related and that she should take 10 mg Tamoxifen until her symptoms improve.  Breast Cancer Surveillance:  Mammogram 09/11/2019: Benign, breast density category C Breast exam 02/28/2020: Benign RTC in 1 year for followup or sooner if needed.

## 2020-03-06 ENCOUNTER — Telehealth: Payer: Self-pay | Admitting: Hematology and Oncology

## 2020-03-06 NOTE — Telephone Encounter (Signed)
Scheduled per 04/07 los, patient has been called and voicemail was left.

## 2020-07-04 ENCOUNTER — Emergency Department (HOSPITAL_BASED_OUTPATIENT_CLINIC_OR_DEPARTMENT_OTHER): Payer: 59

## 2020-07-04 ENCOUNTER — Encounter (HOSPITAL_BASED_OUTPATIENT_CLINIC_OR_DEPARTMENT_OTHER): Payer: Self-pay

## 2020-07-04 ENCOUNTER — Emergency Department (HOSPITAL_BASED_OUTPATIENT_CLINIC_OR_DEPARTMENT_OTHER)
Admission: EM | Admit: 2020-07-04 | Discharge: 2020-07-04 | Disposition: A | Discharge status: Home / Self Care | Payer: 59 | Attending: Emergency Medicine | Admitting: Emergency Medicine

## 2020-07-04 ENCOUNTER — Telehealth (INDEPENDENT_AMBULATORY_CARE_PROVIDER_SITE_OTHER): Payer: 59 | Admitting: Family Medicine

## 2020-07-04 ENCOUNTER — Encounter: Payer: Self-pay | Admitting: Family Medicine

## 2020-07-04 ENCOUNTER — Other Ambulatory Visit: Payer: Self-pay

## 2020-07-04 VITALS — Wt 158.0 lb

## 2020-07-04 DIAGNOSIS — R059 Cough, unspecified: Secondary | ICD-10-CM

## 2020-07-04 DIAGNOSIS — R634 Abnormal weight loss: Secondary | ICD-10-CM

## 2020-07-04 DIAGNOSIS — U071 COVID-19: Secondary | ICD-10-CM | POA: Insufficient documentation

## 2020-07-04 DIAGNOSIS — R05 Cough: Secondary | ICD-10-CM

## 2020-07-04 DIAGNOSIS — Z853 Personal history of malignant neoplasm of breast: Secondary | ICD-10-CM

## 2020-07-04 DIAGNOSIS — R079 Chest pain, unspecified: Secondary | ICD-10-CM

## 2020-07-04 DIAGNOSIS — R0600 Dyspnea, unspecified: Secondary | ICD-10-CM | POA: Diagnosis not present

## 2020-07-04 LAB — CBC WITH DIFFERENTIAL/PLATELET
Abs Immature Granulocytes: 0 10*3/uL (ref 0.00–0.07)
Basophils Absolute: 0 10*3/uL (ref 0.0–0.1)
Basophils Relative: 0 %
Eosinophils Absolute: 0 10*3/uL (ref 0.0–0.5)
Eosinophils Relative: 0 %
HCT: 36.3 % (ref 36.0–46.0)
Hemoglobin: 12 g/dL (ref 12.0–15.0)
Immature Granulocytes: 0 %
Lymphocytes Relative: 27 %
Lymphs Abs: 0.8 10*3/uL (ref 0.7–4.0)
MCH: 30.2 pg (ref 26.0–34.0)
MCHC: 33.1 g/dL (ref 30.0–36.0)
MCV: 91.2 fL (ref 80.0–100.0)
Monocytes Absolute: 0.4 10*3/uL (ref 0.1–1.0)
Monocytes Relative: 13 %
Neutro Abs: 1.8 10*3/uL (ref 1.7–7.7)
Neutrophils Relative %: 60 %
Platelets: 265 10*3/uL (ref 150–400)
RBC: 3.98 MIL/uL (ref 3.87–5.11)
RDW: 13.6 % (ref 11.5–15.5)
WBC: 3.2 10*3/uL — ABNORMAL LOW (ref 4.0–10.5)
nRBC: 0 % (ref 0.0–0.2)

## 2020-07-04 LAB — COMPREHENSIVE METABOLIC PANEL
ALT: 21 U/L (ref 0–44)
AST: 24 U/L (ref 15–41)
Albumin: 3.7 g/dL (ref 3.5–5.0)
Alkaline Phosphatase: 47 U/L (ref 38–126)
Anion gap: 8 (ref 5–15)
BUN: 6 mg/dL (ref 6–20)
CO2: 24 mmol/L (ref 22–32)
Calcium: 8.8 mg/dL — ABNORMAL LOW (ref 8.9–10.3)
Chloride: 104 mmol/L (ref 98–111)
Creatinine, Ser: 0.76 mg/dL (ref 0.44–1.00)
GFR calc Af Amer: >60 mL/min (ref >60)
GFR calc non Af Amer: >60 mL/min (ref >60)
Glucose, Bld: 106 mg/dL — ABNORMAL HIGH (ref 70–99)
Potassium: 3.4 mmol/L — ABNORMAL LOW (ref 3.5–5.1)
Sodium: 136 mmol/L (ref 135–145)
Total Bilirubin: 0.3 mg/dL (ref 0.3–1.2)
Total Protein: 6.9 g/dL (ref 6.5–8.1)

## 2020-07-04 LAB — TSH: TSH: 0.605 u[IU]/mL (ref 0.350–4.500)

## 2020-07-04 LAB — PREGNANCY, URINE: Preg Test, Ur: NEGATIVE

## 2020-07-04 MED ORDER — GUAIFENESIN-CODEINE 100-10 MG/5ML PO SOLN: 5.0000 mL | Freq: Four times a day (QID) | ORAL | 0 refills | Status: DC | PRN

## 2020-07-04 MED ORDER — BENZONATATE 100 MG PO CAPS
100.0000 mg | ORAL_CAPSULE | Freq: Three times a day (TID) | ORAL | 0 refills | Status: DC
Start: 2020-07-04 — End: 2021-08-19

## 2020-07-04 NOTE — ED Notes (Signed)
AVS and Rx reviewed with client, opportunity for questions provided

## 2020-07-04 NOTE — ED Provider Notes (Signed)
[facility] Provider Note   CSN: [id number] Arrival date & time: 07/04/20  1558     History Chief Complaint  Patient presents with   Cough    Brianna [name] is a 39 y.o. female with a past medical history of GERD, IBS, breast cancer status post lumpectomy and radiation therapy in 2019 now on oral chemo presenting to the ED with a chief complaint of cough, decreased appetite and weight loss.  Reports started having this cough as well as fever, body aches and rhinorrhea in March 2021.  She tested negative for Covid.  Though symptoms improved but the dry cough persisted.  Reports a "rattling" in her chest with intermittent left-sided chest pain since March and shortness of breath since March.  Reports symptoms are worse with exertion.  Has had one vaccination against Covid and is scheduled for second dose in 4 days.  Denies any sick contacts with similar symptoms.  When symptoms began she did take over-the-counter medications but has not taken anything in the past few months.  No chronic lung disease.  Does report decreased appetite and 10 pound weight loss since March as well.  She reports nausea and some posttussive emesis.  Denies any vomiting otherwise.  Denies any hemoptysis, leg swelling, history of DVT, PE, MI, recent immobilization or thyroid problems.  HPI     Past Medical History:  Diagnosis Date   Acne    Depression    GERD (gastroesophageal reflux disease)    History of concussion 08/2016   IBS (irritable bowel syndrome)    no current med.   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   Personal history of chemotherapy    Personal history of radiation therapy    Pre-diabetes     Patient Active Problem List   Diagnosis Date Noted   Port-A-Cath in place 09/28/2017   Superficial venous thrombosis of right upper extremity 09/27/2017   Genetic testing 08/09/2017   Malignant neoplasm of upper-outer quadrant of right  breast in female, estrogen receptor positive ([place]) 07/22/2017   IBS (irritable bowel syndrome) 05/25/2017   Prediabetes 05/25/2017   Acne vulgaris 12/18/2015    Past Surgical History:  Procedure Laterality Date   BREAST LUMPECTOMY Right 2018   BREAST LUMPECTOMY WITH RADIOACTIVE SEED AND SENTINEL LYMPH NODE BIOPSY Right 08/11/2017   Procedure: RIGHT BREAST LUMPECTOMY WITH RADIOACTIVE SEED AND RIGHT SENTINEL LYMPH NODE BIOPSY;  Surgeon: Donnie Mesa, MD;  Location: [facility];  Service: General;  Laterality: Right;  ERAS PATHWAY   PORT-A-CATH REMOVAL N/A 09/15/2018   Procedure: REMOVAL PORT-A-CATH;  Surgeon: Donnie Mesa, MD;  Location: [facility];  Service: General;  Laterality: N/A;   PORTACATH PLACEMENT Left 08/11/2017   Procedure: INSERTION PORT-A-CATH WITH ULTRA SOUND;  Surgeon: Donnie Mesa, MD;  Location: [facility];  Service: General;  Laterality: Left;   WISDOM TOOTH EXTRACTION     WOUND EXPLORATION Right 04/19/2018   Procedure: RIGHT AXILLARY WOUND EXPLORATION;  Surgeon: Donnie Mesa, MD;  Location: [facility];  Service: General;  Laterality: Right;     OB History    Gravida  3   Para      Term      Preterm      AB  1   Living        SAB      TAB      Ectopic  1   Multiple      Live Births  Family History  Problem Relation Age of Onset   Heart disease Maternal Grandmother        d.53   Stroke Maternal Grandfather    Diabetes Maternal Grandfather    Hyperlipidemia Mother    Diabetes Maternal Uncle    Heart attack Other    Obesity Other    COPD Other    Asthma Other    Breast cancer Other 82       Maternal grandfather's sister    Social History   Tobacco Use   Smoking status: Never Smoker   Smokeless tobacco: Never Used  Vaping Use   Vaping Use: Never used  Substance Use Topics   Alcohol use: Yes    Comment: occasionally   Drug use: No    Home  Medications Prior to Admission medications   Medication Sig Start Date End Date Taking? Authorizing Provider  benzonatate (TESSALON) 100 MG capsule Take 1 capsule (100 mg total) by mouth every 8 (eight) hours. 07/04/20   Althia Egolf, PA-C  guaiFENesin-codeine 100-10 MG/5ML syrup Take 5 mLs by mouth every 6 (six) hours as needed for cough. 07/04/20   Penina Reisner, PA-C  Sulfacetamide Sodium-Sulfur (CLENIA FOAMING [place]) 10-5 % EMUL Apply 1 application topically 2 (two) times daily.     [provider]  tamoxifen (NOLVADEX) 20 MG tablet Take 1 tablet (20 mg total) by mouth daily. 02/28/20   Nicholas Lose, MD  tretinoin (RETIN-A) 0.025 % gel Apply 1 application topically at bedtime.  04/23/16   [provider]  venlafaxine XR (EFFEXOR-XR) 75 MG 24 hr capsule Take 1 capsule (75 mg total) by mouth daily with breakfast. 02/28/20   Nicholas Lose, MD    Allergies    Patient has no known allergies.  Review of Systems   Review of Systems  Constitutional: Positive for appetite change and unexpected weight change. Negative for chills and fever.  HENT: Negative for ear pain, rhinorrhea, sneezing and sore throat.   Eyes: Negative for photophobia and visual disturbance.  Respiratory: Positive for cough and shortness of breath. Negative for chest tightness and wheezing.   Cardiovascular: Positive for chest pain. Negative for palpitations.  Gastrointestinal: Negative for abdominal pain, blood in stool, constipation, diarrhea, nausea and vomiting.  Genitourinary: Negative for dysuria, hematuria and urgency.  Musculoskeletal: Negative for myalgias.  Skin: Negative for rash.  Neurological: Negative for dizziness, weakness and light-headedness.    Physical Exam Updated Vital Signs BP 129/86 (BP Location: Left Arm)    Pulse 85    Temp 99.9 F (37.7 C) (Oral)    Resp 18    Ht 5\' 3"  (1.6 m)    Wt 73 kg    LMP 07/04/2020    SpO2 100%    BMI 28.52 kg/m   Physical Exam Vitals and nursing note  reviewed.  Constitutional:      General: She is not in acute distress.    Appearance: She is well-developed.     Comments: Cough noted on exam.  HENT:     Head: Normocephalic and atraumatic.     Nose: Nose normal.  Eyes:     General: No scleral icterus.       Right eye: No discharge.        Left eye: No discharge.     Conjunctiva/sclera: Conjunctivae normal.  Cardiovascular:     Rate and Rhythm: Normal rate and regular rhythm.     Heart sounds: Normal heart sounds. No murmur heard.  No friction rub. No gallop.  Pulmonary:     Effort: Pulmonary effort is normal. No respiratory distress.     Breath sounds: Normal breath sounds.  Abdominal:     General: Bowel sounds are normal. There is no distension.     Palpations: Abdomen is soft.     Tenderness: There is no abdominal tenderness. There is no guarding.  Musculoskeletal:        General: Normal range of motion.     Cervical back: Normal range of motion and neck supple.  Skin:    General: Skin is warm and dry.     Findings: No rash.  Neurological:     Mental Status: She is alert.     Motor: No abnormal muscle tone.     Coordination: Coordination normal.     ED Results / Procedures / Treatments   Labs (all labs ordered are listed, but only abnormal results are displayed) Labs Reviewed  COMPREHENSIVE METABOLIC PANEL - Abnormal; Notable for the following components:      Result Value   Potassium 3.4 (*)    Glucose, Bld 106 (*)    Calcium 8.8 (*)    All other components within normal limits  CBC WITH DIFFERENTIAL/PLATELET - Abnormal; Notable for the following components:   WBC 3.2 (*)    All other components within normal limits  SARS CORONAVIRUS 2 (TAT 6-24 HRS)  PREGNANCY, URINE  TSH    EKG EKG Interpretation  Date/Time:  Thursday July 04 2020 18:05:23 EDT Ventricular Rate:  92 PR Interval:    QRS Duration: 75 QT Interval:  347 QTC Calculation: 430 R Axis:   70 Text Interpretation: Sinus rhythm No  previous ECGs available Confirmed by Wandra Arthurs [phone]) on 07/04/2020 7:43:34 PM   Radiology DG Chest Portable 1 View  Result Date: 07/04/2020 CLINICAL DATA:  39 year old female with shortness of breath and cough. EXAM: PORTABLE CHEST 1 VIEW COMPARISON:  Chest radiograph dated 08/11/2017 FINDINGS: The heart size and mediastinal contours are within normal limits. Both lungs are clear. The visualized skeletal structures are unremarkable. IMPRESSION: No active disease. Electronically Signed   By: Anner Crete M.D.   On: 07/04/2020 18:34    Procedures Procedures (including critical care time)  Medications Ordered in ED Medications - No data to display  ED Course  I have reviewed the triage vital signs and the nursing notes.  Pertinent labs & imaging results that were available during my care of the patient were reviewed by me and considered in my medical decision making (see chart for details).    MDM Rules/Calculators/A&P                          Brianna [name] was evaluated in Emergency Department on 07/04/20 for the symptoms described in the history of present illness. He/she was evaluated in the context of the global COVID-19 pandemic, which necessitated consideration that the patient might be at risk for infection with the SARS-CoV-2 virus that causes COVID-19. Institutional protocols and algorithms that pertain to the evaluation of patients at risk for COVID-19 are in a state of rapid change based on information released by regulatory bodies including the CDC and federal and state organizations. These policies and algorithms were followed during the patient's care in the ED.  39 year old female presenting to the ED with a chief complaint 39 year old female with past medical history of GERD, IBS, breast cancer status post lumpectomy and radiation therapy in 2019 now on oral chemo presenting to  the ED with a chief complaint of cough, decreased appetite and weight loss.  Cough, body  aches, fever and rhinorrhea in March 2021.  Those symptoms resolved but she has had a persistent dry cough and "rattling" in her chest since then.  Intermittent left-sided chest pain and shortness of breath since March as well.  She does report decreased appetite level and 10 pound weight loss since March.  Denies any night sweats, abdominal pain, hemoptysis or leg swelling.  On exam patient is overall well-appearing.  Lungs are clear to auscultation bilaterally.  She does have a dry cough noted on exam.  EKG shows sinus rhythm, no ischemic changes.  Chest x-ray is unremarkable.  CMP, CBC unremarkable.  Covid test is pending.  Pregnancy test is negative and TSH is pending.  Suspect that her symptoms could be due to chronic lung disease.  No evidence of pneumonia at this time.  No suspicious nodules.  I feel that she will benefit from PCP follow-up for potential referral to pulmonology.  Her oxygen saturations remain 100% on room air.  Will discharge with antitussives and strict return precautions.  All imaging, if done today, including plain films, CT scans, and ultrasounds, independently reviewed by me, and interpretations confirmed via formal radiology reads.  Patient is hemodynamically stable, in NAD, and able to ambulate in the ED. Evaluation does not show pathology that would require ongoing emergent intervention or inpatient treatment. I explained the diagnosis to the patient. Pain has been managed and has no complaints prior to discharge. Patient is comfortable with above plan and is stable for discharge at this time. All questions were answered prior to disposition. Strict return precautions for returning to the ED were discussed. Encouraged follow up with PCP.   An After Visit Summary was printed and given to the patient.   Portions of this note were generated with [employer]. Dictation errors may occur despite best attempts at proofreading.  Final Clinical Impression(s) / ED  Diagnoses Final diagnoses:  Cough    Rx / DC Orders ED Discharge Orders         Ordered    benzonatate (TESSALON) 100 MG capsule  Every 8 hours     Discontinue  Reprint     07/04/20 2027    guaiFENesin-codeine 100-10 MG/5ML syrup  Every 6 hours PRN     Discontinue  Reprint     07/04/20 2027           Delia Heady, PA-C 07/04/20 2030    Drenda Freeze, MD 07/04/20 2318

## 2020-07-04 NOTE — ED Notes (Signed)
Began having a cough back in March, felt like she had COVID, but tested negative. Dry irritating cough. Had some SOB with coughing, when active does not fill short of breath. Denies fevers. Has good chest excursion. Has only had one vaccine for COVID, due for the second one August 16th.

## 2020-07-04 NOTE — ED Triage Notes (Addendum)
Pt c/o flu like sx with weight lossx 4 months-states she was neg covid in March-states she had covid vaccine #1 7/27-NAD-steady gait

## 2020-07-04 NOTE — ED Notes (Signed)
RESTRICTED EXTREMITY bracelet placed on rt arm

## 2020-07-04 NOTE — ED Notes (Signed)
ED Provider at bedside. 

## 2020-07-04 NOTE — Discharge Instructions (Signed)
Take the medications to help with her cough. You will need to follow-up with your primary care provider if they want to refer you to a lung specialist. Your chest x-ray did not show any abnormalities.  We have pulled a thyroid lab on you and your primary care provider can follow-up with this. Return to the ER if you start to develop cough productive with blood, chest pain, shortness of breath, uncontrollable vomiting.

## 2020-07-04 NOTE — Progress Notes (Signed)
Virtual Visit via Video Note  I connected with Brianna [name]  on 07/04/20 at  3:00 PM EDT by a video enabled telemedicine application and verified that I am speaking with the correct person using two identifiers.  Location patient: home, [place] Location provider:work or home office Persons participating in the virtual visit: patient, provider  I discussed the limitations of evaluation and management by telemedicine and the availability of in person appointments. The patient expressed understanding and agreed to proceed.   HPI:  Acute visit for a cough: -got really sick in March 2021 with a resp illness - she thought it was covid, but the covid test she did at the time was negative -reports ever since that time she has had a bad cough, "rattle" in the lungs, SOB at times, nasal congestion, sneezing, loss of appetite and 10lb weight reduction, irritation in the throat, chest pain on and off -denies fevers, malaise, hemoptysis -went through treatment/chemo for breast cancer in 2018-2019  -she is getting the 2nd covid dose later this month -symptoms seem worse recently and husband told her today that she really needs to get checked out  [name]: See pertinent positives and negatives per HPI.  Past Medical History:  Diagnosis Date  . Acne   . Depression   . GERD (gastroesophageal reflux disease)   . History of concussion 08/2016  . IBS (irritable bowel syndrome)    no current med.  . 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 Personal history of chemotherapy   . Personal history of radiation therapy   . Pre-diabetes     Past Surgical History:  Procedure Laterality Date  . BREAST LUMPECTOMY Right 2018  . BREAST LUMPECTOMY WITH RADIOACTIVE SEED AND SENTINEL LYMPH NODE BIOPSY Right 08/11/2017   Procedure: RIGHT BREAST LUMPECTOMY WITH RADIOACTIVE SEED AND RIGHT SENTINEL LYMPH NODE BIOPSY;  Surgeon: Donnie Mesa, MD;  Location: [facility];  Service: General;   Laterality: Right;  ERAS PATHWAY  . PORT-A-CATH REMOVAL N/A 09/15/2018   Procedure: REMOVAL PORT-A-CATH;  Surgeon: Donnie Mesa, MD;  Location: [facility];  Service: General;  Laterality: N/A;  . PORTACATH PLACEMENT Left 08/11/2017   Procedure: INSERTION PORT-A-CATH WITH ULTRA SOUND;  Surgeon: Donnie Mesa, MD;  Location: [facility];  Service: General;  Laterality: Left;  . WISDOM TOOTH EXTRACTION    . WOUND EXPLORATION Right 04/19/2018   Procedure: RIGHT AXILLARY WOUND EXPLORATION;  Surgeon: Donnie Mesa, MD;  Location: [facility];  Service: General;  Laterality: Right;    Family History  Problem Relation Age of Onset  . Heart disease Maternal Grandmother        d.53  . Stroke Maternal Grandfather   . Diabetes Maternal Grandfather   . Hyperlipidemia Mother   . Diabetes Maternal Uncle   . Heart attack Other   . Obesity Other   . COPD Other   . Asthma Other   . Breast cancer Other 25       Maternal grandfather's sister    SOCIAL HX: see hpi  No current facility-administered medications for this visit.  Current Outpatient Medications:  .  Sulfacetamide Sodium-Sulfur (CLENIA FOAMING [place]) 10-5 % EMUL, Apply 1 application topically 2 (two) times daily. , Disp: , Rfl:  .  tamoxifen (NOLVADEX) 20 MG tablet, Take 1 tablet (20 mg total) by mouth daily., Disp: 90 tablet, Rfl: 3 .  tretinoin (RETIN-A) 0.025 % gel, Apply 1 application topically at bedtime. , Disp: , Rfl:  .  venlafaxine XR (EFFEXOR-XR) 75 MG 24 hr capsule, Take 1 capsule (75 mg total) by mouth daily with breakfast., Disp: 90 capsule, Rfl: 3  Facility-Administered Medications Ordered in Other Visits:  .  sodium chloride flush (NS) 0.9 % injection 10 mL, 10 mL, Intracatheter, PRN, Nicholas Lose, MD, 10 mL at 07/20/18 1536  EXAM:  VITALS per patient if applicable:  GENERAL: alert, oriented, appears well and in no acute distress  HEENT: atraumatic, conjunttiva clear, no obvious abnormalities  on inspection of external nose and ears  NECK: normal movements of the head and neck  LUNGS: on inspection no signs of respiratory distress, breathing rate appears normal, no obvious gross SOB, gasping or wheezing, she does have a hacking cough a number of times throughout the video visit  CV: no obvious cyanosis  MS: moves all visible extremities without noticeable abnormality  PSYCH/NEURO: pleasant and cooperative, no obvious depression or anxiety, speech and thought processing grossly intact  ASSESSMENT AND PLAN:  Discussed the following assessment and plan:  Cough  Dyspnea, unspecified type  Chest pain, unspecified type  Weight loss  History of breast cancer  -we discussed possible serious and likely etiologies, options for evaluation and workup, limitations of telemedicine visit vs in person visit, treatment, treatment risks and precautions. Pt prefers to treat via telemedicine empirically rather then risking or undertaking an in person visit at this moment. Very concerning symptoms and feel she needs inperson evaluation and likely imaging. Discussed options. Since is feeling worse acutely recently and she opted to go to local [facility] today rather than waiting to see if I could get her in with PCP office or pulm. Also. I advised regarding to let her pulmonologist know and to follow up with PCP as well after acute visit today. > 20 minutes spent on this visit. Sent message to PCP office to schedule follow up.   I discussed the assessment and treatment plan with the patient. The patient was provided an opportunity to ask questions and all were answered. The patient agreed with the plan and demonstrated an understanding of the instructions.   The patient was advised to call back or seek an in-person evaluation if the symptoms worsen or if the condition fails to improve as anticipated.   Brianna Kern, DO

## 2020-07-05 ENCOUNTER — Telehealth: Payer: Self-pay | Admitting: Family Medicine

## 2020-07-05 LAB — SARS CORONAVIRUS 2 (TAT 6-24 HRS): SARS Coronavirus 2: POSITIVE — AB

## 2020-07-05 NOTE — Telephone Encounter (Signed)
Pt is calling in stating that she just tested positive for COVID and just have a few questions as what she needs to do at this point.  Pt declined a virtual visit due to her doing one on 07/04/2020 with another provider.

## 2020-07-08 NOTE — Telephone Encounter (Signed)
I called and spoke with patient. She does not have any additional questions at this time, she was called with her results on Friday afternoon. She will let us know if anything changes.

## 2020-08-07 ENCOUNTER — Institutional Professional Consult (permissible substitution): Payer: 59 | Admitting: Pulmonary Disease

## 2020-09-19 ENCOUNTER — Other Ambulatory Visit: Payer: Self-pay | Admitting: Hematology and Oncology

## 2020-09-19 DIAGNOSIS — Z9889 Other specified postprocedural states: Secondary | ICD-10-CM

## 2020-10-16 ENCOUNTER — Ambulatory Visit
Admission: RE | Admit: 2020-10-16 | Discharge: 2020-10-16 | Disposition: A | Discharge status: Home / Self Care | Payer: 59 | Source: Ambulatory Visit | Attending: Hematology and Oncology | Admitting: Hematology and Oncology

## 2020-10-16 ENCOUNTER — Other Ambulatory Visit: Payer: Self-pay

## 2020-10-16 DIAGNOSIS — Z9889 Other specified postprocedural states: Secondary | ICD-10-CM

## 2021-02-25 NOTE — Progress Notes (Incomplete)
Patient Care Team: [name], [name], MD as PCP - General (Family Medicine) Nicholas Lose, MD as Consulting Physician (Hematology and Oncology) Donnie Mesa, MD as Consulting Physician (General Surgery) Kyung Rudd, MD as Consulting Physician (Radiation Oncology) Delice Bison Charlestine Massed, NP as Nurse Practitioner (Hematology and Oncology)  DIAGNOSIS: No diagnosis found.  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  The Hereditary Gene Panel offered by [facility] includes sequencing and/or deletion duplication testing of the following 46 genes: APC, ATM, AXIN2, BARD1, BMPR1A, BRCA1, BRCA2, BRIP1, CDH1, CDKN2A (p14ARF), CDKN2A (p16INK4a), CHEK2, CTNNA1, DICER1, EPCAM (Deletion/duplication testing only), GREM1 (promoter region deletion/duplication testing only), KIT, MEN1, MLH1, MSH2, MSH3, MSH6, MUTYH, NBN, NF1, NHTL1, PALB2, PDGFRA, PMS2, POLD1, POLE, PTEN, RAD50, RAD51C, RAD51D, SDHB, SDHC, SDHD, SMAD4, SMARCA4. STK11, TP53, TSC1, TSC2, and VHL.  The following genes were evaluated for sequence changes only: SDHA and HOXB13 c.251G>A variant only.   The report date is August 06, 2017.  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  The updated  report date is April 26, 2019.    08/11/2017 Surgery   Right lumpectomy: IDC with DCIS, 1.4 cm, grade 3, DCIS focally involving posterior margin, 0/3 lymph nodes negative, ER 90%, PR 0%, HER-2 positive ratio 2.6, Ki-67 40% T1 CN 0 stage IA   09/07/2017 - 12/21/2017 Adjuvant Chemotherapy   TCH x 6, then maintenance Herceptin x 1 year (Taxotere not given 4 cycles 5 and 6)   01/19/2018 - 03/04/2018 Radiation Therapy   Adjuvant radiation therapy   08/12/2018 -  Anti-estrogen oral therapy   Tamoxifen     CHIEF COMPLIANT: Follow-up of right breast cancer on tamoxifen  INTERVAL HISTORY: Brianna [name] is a 40 y.o. with above-mentioned history of right breast cancer treated with lumpectomy, adjuvant chemotherapy, radiation, and who is currently on antiestrogen therapy with tamoxifen. Mammogram on 10/16/20 showed no evidence of malignancy bilaterally. She presents to the clinic today for annual follow-up.   ALLERGIES:  has No Known Allergies.  MEDICATIONS:  Current Outpatient Medications  Medication Sig Dispense Refill  . benzonatate (TESSALON) 100 MG capsule Take 1 capsule (100 mg total) by mouth every 8 (eight) hours. 21 capsule 0  . guaiFENesin-codeine 100-10 MG/5ML syrup Take 5 mLs by mouth every 6 (six) hours as needed for cough. 50 mL 0  . Sulfacetamide Sodium-Sulfur (CLENIA FOAMING WASH) 10-5 % EMUL Apply 1 application topically 2 (two) times daily.     . tamoxifen (NOLVADEX) 20 MG tablet Take 1 tablet (20 mg total) by mouth daily. 90 tablet 3  . tretinoin (RETIN-A) 0.025 % gel Apply 1 application topically at bedtime.     Marland Kitchen venlafaxine XR (EFFEXOR-XR) 75 MG 24 hr capsule Take 1 capsule (75 mg total) by mouth daily with breakfast. 90 capsule 3  No current facility-administered medications for this visit.   Facility-Administered Medications Ordered in Other Visits  Medication Dose Route Frequency Provider Last Rate Last Admin  . sodium chloride flush (NS) 0.9 % injection 10 mL  10 mL  Intracatheter PRN Nicholas Lose, MD   10 mL at 07/20/18 1536    PHYSICAL EXAMINATION: ECOG PERFORMANCE STATUS: {CHL ONC ECOG [phone]}  There were no vitals filed for this visit. There were no vitals filed for this visit.  BREAST:*** No palpable masses or nodules in either right or left breasts. No palpable axillary supraclavicular or infraclavicular adenopathy no breast tenderness or nipple discharge. (exam performed in the presence of a chaperone)  LABORATORY DATA:  I have reviewed the data as listed CMP Latest Ref Rng & Units 07/04/2020 08/14/2019 09/15/2018  Glucose 70 - 99 mg/dL 106(H) 102(H) 89  BUN 6 - 20 mg/dL $Remove'6 10 11  'rwEwbGE$ Creatinine 0.44 - 1.00 mg/dL 0.76 0.81 0.96  Sodium 135 - 145 mmol/L 136 140 137  Potassium 3.5 - 5.1 mmol/L 3.4(L) 4.1 5.4(H)  Chloride 98 - 111 mmol/L 104 104 108  CO2 22 - 32 mmol/L $RemoveB'24 29 22  'TIbrDxZm$ Calcium 8.9 - 10.3 mg/dL 8.8(L) 9.4 9.0  Total Protein 6.5 - 8.1 g/dL 6.9 - -  Total Bilirubin 0.3 - 1.2 mg/dL 0.3 - -  Alkaline Phos 38 - 126 U/L 47 - -  AST 15 - 41 U/L 24 - -  ALT 0 - 44 U/L 21 - -    Lab Results  Component Value Date   WBC 3.2 (L) 07/04/2020   HGB 12.0 07/04/2020   HCT 36.3 07/04/2020   MCV 91.2 07/04/2020   PLT 265 07/04/2020   NEUTROABS 1.8 07/04/2020    ASSESSMENT & PLAN:  No problem-specific Assessment & Plan notes found for this encounter.    No orders of the defined types were placed in this encounter.  The patient has a good understanding of the overall plan. she agrees with it. she will call with any problems that may develop before the next visit here.  Total time spent: *** mins including face to face time and time spent for planning, charting and coordination of care  Rulon Eisenmenger, MD, MPH 02/25/2021  I, Molly Dorshimer, am acting as scribe for Dr. Nicholas Lose.  {insert scribe attestation}

## 2021-02-26 ENCOUNTER — Inpatient Hospital Stay: Payer: 59 | Attending: Hematology and Oncology | Admitting: Hematology and Oncology

## 2021-02-26 NOTE — Assessment & Plan Note (Deleted)
Right lumpectomy: IDC with DCIS, 1.4 cm, grade 3, DCIS focally involving posterior margin, 0/3 lymph nodes negative, ER 90%, PR 0%, HER-2 positive ratio 2.6, Ki-67 40% T1 CN 0 stage IA Tumor board did not recommend surgery for the positive posterior margin issue  Recommendation: 1.Adjuvant chemotherapy with TCH followed by Herceptin maintenance for a yearstarted 09/07/2018-12/21/2017 3.Adjuvant radiationstarted 01/19/2018 4. Followed by adjuvant antiestrogen therapy with tamoxifen that was started prior to surgery -------------------------------------------------------------------- Current Treatment: Tamoxifen 20 mg daily started 08/12/18 Tamoxifen Toxicities: 1. Severe emotional outbursts: Currently on effexor XR 75 mg and she is doing very well on that. Hot flashes have also improved dramatically.  Breast Cancer Surveillance:  Mammogram 10/16/20: Benign, breast density category C Breast exam 02/26/21: Benign  RTC in 1 year for followup 

## 2021-03-16 ENCOUNTER — Other Ambulatory Visit: Payer: Self-pay | Admitting: Hematology and Oncology

## 2021-03-16 DIAGNOSIS — F320 Major depressive disorder, single episode, mild: Secondary | ICD-10-CM

## 2021-03-17 ENCOUNTER — Other Ambulatory Visit: Payer: Self-pay | Admitting: Hematology and Oncology

## 2021-08-19 ENCOUNTER — Other Ambulatory Visit: Payer: Self-pay

## 2021-08-19 ENCOUNTER — Ambulatory Visit
Admission: EM | Admit: 2021-08-19 | Discharge: 2021-08-19 | Disposition: A | Discharge status: Home / Self Care | Payer: 59 | Attending: Emergency Medicine | Admitting: Emergency Medicine

## 2021-08-19 DIAGNOSIS — N390 Urinary tract infection, site not specified: Secondary | ICD-10-CM | POA: Diagnosis present

## 2021-08-19 LAB — POCT URINALYSIS DIP (MANUAL ENTRY)
Bilirubin, UA: NEGATIVE
Glucose, UA: NEGATIVE mg/dL
Nitrite, UA: NEGATIVE
Protein Ur, POC: NEGATIVE mg/dL
Spec Grav, UA: >=1.03 — AB (ref 1.010–1.025)
Urobilinogen, UA: 0.2 E.U./dL
pH, UA: 6 (ref 5.0–8.0)

## 2021-08-19 LAB — POCT URINE PREGNANCY: Preg Test, Ur: NEGATIVE

## 2021-08-19 MED ORDER — PHENAZOPYRIDINE HCL 200 MG PO TABS
200.0000 mg | ORAL_TABLET | Freq: Three times a day (TID) | ORAL | 0 refills | Status: DC
Start: 2021-08-19 — End: 2022-03-25

## 2021-08-19 MED ORDER — NITROFURANTOIN MONOHYD MACRO 100 MG PO CAPS: 100.0000 mg | ORAL_CAPSULE | Freq: Two times a day (BID) | ORAL | 0 refills | Status: AC

## 2021-08-19 NOTE — ED Triage Notes (Signed)
Pt reports having urinary urgency with frequency. Patient denies having any vaginal issues and lower back/ abd pain.  Started: Saturday

## 2021-08-19 NOTE — ED Provider Notes (Signed)
[facility]    CSN: [id number] Arrival date & time: 08/19/21  1010      History   Chief Complaint Chief Complaint  Patient presents with   Urinary Frequency    HPI Brianna [name] is a 40 y.o. female history of GERD, IBS, presenting today for evaluation of possible UTI.  Urinary frequency, urgency, incomplete voiding x4 days.  Denies dysuria.  Denies fevers nausea or vomiting.  History of prior UTIs, but most recent was approximately 7 years ago.  HPI  Past Medical History:  Diagnosis Date   Acne    Depression    GERD (gastroesophageal reflux disease)    History of concussion 08/2016   IBS (irritable bowel syndrome)    no current med.   Invasive ductal carcinoma of breast, right ([place]) 07/2017   Personal history of chemotherapy    Personal history of radiation therapy    Pre-diabetes     Patient Active Problem List   Diagnosis Date Noted   Port-A-Cath in place 09/28/2017   Superficial venous thrombosis of right upper extremity 09/27/2017   Genetic testing 08/09/2017   Malignant neoplasm of upper-outer quadrant of right breast in female, estrogen receptor positive ([place]) 07/22/2017   IBS (irritable bowel syndrome) 05/25/2017   Prediabetes 05/25/2017   Acne vulgaris 12/18/2015    Past Surgical History:  Procedure Laterality Date   BREAST LUMPECTOMY Right 2018   BREAST LUMPECTOMY WITH RADIOACTIVE SEED AND SENTINEL LYMPH NODE BIOPSY Right 08/11/2017   Procedure: RIGHT BREAST LUMPECTOMY WITH RADIOACTIVE SEED AND RIGHT SENTINEL LYMPH NODE BIOPSY;  Surgeon: Donnie Mesa, MD;  Location: [place];  Service: General;  Laterality: Right;  ERAS PATHWAY   PORT-A-CATH REMOVAL N/A 09/15/2018   Procedure: REMOVAL PORT-A-CATH;  Surgeon: Donnie Mesa, MD;  Location: [place];  Service: General;  Laterality: N/A;   PORTACATH PLACEMENT Left 08/11/2017   Procedure: INSERTION PORT-A-CATH WITH ULTRA SOUND;  Surgeon: Donnie Mesa, MD;  Location: [place];  Service: General;  Laterality: Left;   WISDOM TOOTH EXTRACTION     WOUND EXPLORATION Right 04/19/2018   Procedure: RIGHT AXILLARY WOUND EXPLORATION;  Surgeon: Donnie Mesa, MD;  Location: [place];  Service: General;  Laterality: Right;    OB History     Gravida  3   Para      Term      Preterm      AB  1   Living         SAB      IAB      Ectopic  1   Multiple      Live Births               Home Medications    Prior to Admission medications   Medication Sig Start Date End Date Taking? Authorizing Provider  nitrofurantoin, macrocrystal-monohydrate, (MACROBID) 100 MG capsule Take 1 capsule (100 mg total) by mouth 2 (two) times daily for 5 days. 08/19/21 08/24/21 Yes Yi Haugan C, PA-C  phenazopyridine (PYRIDIUM) 200 MG tablet Take 1 tablet (200 mg total) by mouth 3 (three) times daily. 08/19/21  Yes Jahdiel Krol C, PA-C  Sulfacetamide Sodium-Sulfur (CLENIA FOAMING WASH) 10-5 % EMUL Apply 1 application topically 2 (two) times daily.     [provider]  tamoxifen (NOLVADEX) 20 MG tablet TAKE 1 TABLET BY MOUTH EVERY DAY 03/17/21   Nicholas Lose, MD  tretinoin (RETIN-A) 0.025 % gel Apply 1 application topically at bedtime.  04/23/16  [provider]  venlafaxine XR (EFFEXOR-XR) 75 MG 24 hr capsule TAKE 1 CAPSULE (75 MG TOTAL) BY MOUTH DAILY WITH BREAKFAST. 03/17/21   Nicholas Lose, MD    Family History Family History  Problem Relation Age of Onset   Heart disease Maternal Grandmother        d.53   Stroke Maternal Grandfather    Diabetes Maternal Grandfather    Hyperlipidemia Mother    Diabetes Maternal Uncle    Heart attack Other    Obesity Other    COPD Other    Asthma Other    Breast cancer Other 68       Maternal grandfather's sister    Social History Social History   Tobacco Use   Smoking status: Never   Smokeless tobacco: Never  Vaping Use   Vaping Use: Never used  Substance Use Topics    Alcohol use: Yes    Comment: occasionally   Drug use: No     Allergies   Patient has no known allergies.   Review of Systems Review of Systems  Constitutional:  Negative for fever.  Respiratory:  Negative for shortness of breath.   Cardiovascular:  Negative for chest pain.  Gastrointestinal:  Negative for abdominal pain, diarrhea, nausea and vomiting.  Genitourinary:  Positive for frequency and urgency. Negative for dysuria, flank pain, genital sores, hematuria, menstrual problem, vaginal bleeding, vaginal discharge and vaginal pain.  Musculoskeletal:  Negative for back pain.  Skin:  Negative for rash.  Neurological:  Negative for dizziness, light-headedness and headaches.    Physical Exam Triage Vital Signs ED Triage Vitals  Enc Vitals Group     BP      Pulse      Resp      Temp      Temp src      SpO2      Weight      Height      Head Circumference      Peak Flow      Pain Score      Pain Loc      Pain Edu?      Excl. in [place]?    No data found.  Updated Vital Signs BP 109/78 (BP Location: Right Arm)   Pulse 82   Temp 98.8 F (37.1 C) (Oral)   Resp 16   LMP 07/31/2021 (Approximate)   SpO2 98%   Visual Acuity Right Eye Distance:   Left Eye Distance:   Bilateral Distance:    Right Eye Near:   Left Eye Near:    Bilateral Near:     Physical Exam Vitals and nursing note reviewed.  Constitutional:      Appearance: She is well-developed.     Comments: No acute distress  HENT:     Head: Normocephalic and atraumatic.     Nose: Nose normal.  Eyes:     Conjunctiva/sclera: Conjunctivae normal.  Cardiovascular:     Rate and Rhythm: Normal rate and regular rhythm.  Pulmonary:     Effort: Pulmonary effort is normal. No respiratory distress.  Abdominal:     General: There is no distension.  Musculoskeletal:        General: Normal range of motion.     Cervical back: Neck supple.  Skin:    General: Skin is warm and dry.  Neurological:     Mental  Status: She is alert and oriented to person, place, and time.     UC Treatments / Results  Labs (all labs ordered are listed, but only abnormal results are displayed) Labs Reviewed  POCT URINALYSIS DIP (MANUAL ENTRY) - Abnormal; Notable for the following components:      Result Value   Clarity, UA cloudy (*)    Ketones, POC UA small (15) (*)    Spec Grav, UA >=1.030 (*)    Blood, UA trace-intact (*)    Leukocytes, UA Small (1+) (*)    All other components within normal limits  URINE CULTURE  POCT URINE PREGNANCY    EKG   Radiology No results found.  Procedures Procedures (including critical care time)  Medications Ordered in UC Medications - No data to display  Initial Impression / Assessment and Plan / UC Course  I have reviewed the triage vital signs and the nursing notes.  Pertinent labs & imaging results that were available during my care of the patient were reviewed by me and considered in my medical decision making (see chart for details).     Small leuks, trace hemoglobin, consistent with UTI, will send for urine culture to confirm as well as check sensitivities.  Empirically treating with Macrobid, Pyridium to help with pain, push fluids.  Discussed strict return precautions. Patient verbalized understanding and is agreeable with plan.  Final Clinical Impressions(s) / UC Diagnoses   Final diagnoses:  Lower urinary tract infection, acute     Discharge Instructions      Urine showed evidence of infection. We are treating you with macrobid-twice daily for 5 days. Be sure to take full course. Stay hydrated- urine should be pale yellow to clear. May use pyridium for bladder pain/spasm  Please return or follow up with your primary provider if symptoms not improving with treatment. Please return sooner if you have worsening of symptoms or develop fever, nausea, vomiting, abdominal pain, back pain, lightheadedness, dizziness.     ED Prescriptions      Medication Sig Dispense Auth. Provider   nitrofurantoin, macrocrystal-monohydrate, (MACROBID) 100 MG capsule Take 1 capsule (100 mg total) by mouth 2 (two) times daily for 5 days. 10 capsule Derion Kreiter C, PA-C   phenazopyridine (PYRIDIUM) 200 MG tablet Take 1 tablet (200 mg total) by mouth 3 (three) times daily. 6 tablet Kioni Stahl, [place] C, PA-C      PDMP not reviewed this encounter.   Janith Lima, [place] 08/19/21 1152

## 2021-08-19 NOTE — Discharge Instructions (Signed)
Urine showed evidence of infection. We are treating you with macrobid-twice daily for 5 days. Be sure to take full course. Stay hydrated- urine should be pale yellow to clear. May use pyridium for bladder pain/spasm  Please return or follow up with your primary provider if symptoms not improving with treatment. Please return sooner if you have worsening of symptoms or develop fever, nausea, vomiting, abdominal pain, back pain, lightheadedness, dizziness.

## 2021-08-21 LAB — URINE CULTURE: Culture: >=100000 — AB

## 2021-09-05 ENCOUNTER — Other Ambulatory Visit: Payer: Self-pay | Admitting: Hematology and Oncology

## 2021-09-05 DIAGNOSIS — Z9889 Other specified postprocedural states: Secondary | ICD-10-CM

## 2021-10-21 ENCOUNTER — Ambulatory Visit
Admission: RE | Admit: 2021-10-21 | Discharge: 2021-10-21 | Disposition: A | Discharge status: Home / Self Care | Payer: 59 | Source: Ambulatory Visit | Attending: Hematology and Oncology | Admitting: Hematology and Oncology

## 2021-10-21 ENCOUNTER — Other Ambulatory Visit: Payer: Self-pay | Admitting: Hematology and Oncology

## 2021-10-21 DIAGNOSIS — Z9889 Other specified postprocedural states: Secondary | ICD-10-CM

## 2021-10-21 DIAGNOSIS — R2231 Localized swelling, mass and lump, right upper limb: Secondary | ICD-10-CM

## 2022-03-18 ENCOUNTER — Other Ambulatory Visit: Payer: Self-pay | Admitting: Hematology and Oncology

## 2022-03-18 ENCOUNTER — Telehealth: Payer: Self-pay | Admitting: Hematology and Oncology

## 2022-03-18 DIAGNOSIS — F320 Major depressive disorder, single episode, mild: Secondary | ICD-10-CM

## 2022-03-18 NOTE — Telephone Encounter (Signed)
Per 4/26 in basket called and spoke to pt about appointment pt confirmed appointment  ?

## 2022-03-25 ENCOUNTER — Encounter: Payer: Self-pay | Admitting: Family Medicine

## 2022-03-25 ENCOUNTER — Ambulatory Visit (INDEPENDENT_AMBULATORY_CARE_PROVIDER_SITE_OTHER): Payer: 59 | Admitting: Family Medicine

## 2022-03-25 VITALS — BP 120/70 | HR 100 | Resp 16 | Ht 63.0 in | Wt 152.2 lb

## 2022-03-25 DIAGNOSIS — H1031 Unspecified acute conjunctivitis, right eye: Secondary | ICD-10-CM | POA: Diagnosis not present

## 2022-03-25 DIAGNOSIS — H5711 Ocular pain, right eye: Secondary | ICD-10-CM

## 2022-03-25 MED ORDER — OXYCODONE-ACETAMINOPHEN 5-325 MG PO TABS: 1.0000 | ORAL_TABLET | Freq: Three times a day (TID) | ORAL | 0 refills | Status: AC | PRN

## 2022-03-25 NOTE — Progress Notes (Signed)
? ? ?Chief Complaint  ?Patient presents with  ? Follow-up  ?  From urgent care; pink eye. Not getting any better with the medications they prescribed. Was seen on Monday.  ? ?HPI: ?[name] is a 41 y.o. female with history of IBS,depression, and right breast cancer on tamoxifen here today to follow on recent urgent care visit. ?C/O worsening eye pain as described above. ? ?She was evaluated in acute care on 03/23/2022, diagnosed with acute conjunctivitis and treated with topical antibiotics, Polymycin B-time atropine eyedrops. ?She is not longer having yellowish drainage but conjunctival erythema and photophobia are getting worse. ?Eye pain is constant, exacerbated by eye opening, it is interfering with his sleep. ?Palpebral  edema, no erythema. ?She is not taking OTC analgesics. ?+ Blurry vision. ?Right temporal frontal headache and ear ache. ?Negative for fever,chills, diplopia, hemianopsia, sore throat, focal weakness,or skin rash. ? ?No hx of trauma or sick contact. ? ?Review of Systems  ?Constitutional:  Positive for fatigue. Negative for chills and fever.  ?HENT:  Positive for congestion, postnasal drip and rhinorrhea.   ?Respiratory:  Negative for cough, shortness of breath and wheezing.   ?Gastrointestinal:  Negative for abdominal pain, nausea and vomiting.  ?Musculoskeletal:  Negative for arthralgias, joint swelling and myalgias.  ?Neurological:  Negative for syncope and facial asymmetry.  ?Hematological:  Negative for adenopathy. Does not bruise/bleed easily.  ?Rest see pertinent positives and negatives per HPI. ? ?Current Outpatient Medications on File Prior to Visit  ?Medication Sig Dispense Refill  ? tamoxifen (NOLVADEX) 20 MG tablet TAKE 1 TABLET BY MOUTH EVERY DAY 30 tablet 0  ? venlafaxine XR (EFFEXOR-XR) 75 MG 24 hr capsule TAKE 1 CAPSULE BY MOUTH DAILY WITH BREAKFAST. 30 capsule 0  ? ?Current Facility-Administered Medications on File Prior to Visit  ?Medication Dose Route Frequency  Provider Last Rate Last Admin  ? sodium chloride flush (NS) 0.9 % injection 10 mL  10 mL Intracatheter PRN Nicholas Lose, MD   10 mL at 07/20/18 1536  ? ?Past Medical History:  ?Diagnosis Date  ? Acne   ? Depression   ? GERD (gastroesophageal reflux disease)   ? History of concussion 08/2016  ? IBS (irritable bowel syndrome)   ? no current med.  ? Invasive ductal carcinoma of breast, right ([place]) 07/2017  ? Personal history of chemotherapy   ? Personal history of radiation therapy   ? Pre-diabetes   ? ?No Known Allergies ? ?Social History  ? ?Socioeconomic History  ? Marital status: Married  ?  Spouse name: Not on file  ? Number of children: Not on file  ? Years of education: Not on file  ? Highest education level: Not on file  ?Occupational History  ? Not on file  ?Tobacco Use  ? Smoking status: Never  ? Smokeless tobacco: Never  ?Vaping Use  ? Vaping Use: Never used  ?Substance and Sexual Activity  ? Alcohol use: Yes  ?  Comment: occasionally  ? Drug use: No  ? Sexual activity: Yes  ?  Birth control/protection: None  ?Other Topics Concern  ? Not on file  ?Social History Narrative  ? Not on file  ? ?Social Determinants of Health  ? ?Financial Resource Strain: Not on file  ?Food Insecurity: Not on file  ?Transportation Needs: Not on file  ?Physical Activity: Not on file  ?Stress: Not on file  ?Social Connections: Not on file  ? ?Vitals:  ? 03/25/22 0941  ?BP: 120/70  ?Pulse: 100  ?  Resp: 16  ?SpO2: 95%  ? ?Body mass index is 26.97 kg/m?. ? ?Physical Exam ?Vitals and nursing note reviewed.  ?HENT:  ?   Head: Normocephalic and atraumatic.  ?   Right Ear: Tympanic membrane, ear canal and external ear normal.  ?   Left Ear: Tympanic membrane, ear canal and external ear normal.  ?   Nose: Rhinorrhea present.  ?   Mouth/Throat:  ?   Mouth: Mucous membranes are moist.  ?   Pharynx: Oropharynx is clear.  ?Eyes:  ?   General:     ?   Right eye: Discharge present. No hordeolum.  ?   Extraocular Movements: Extraocular movements  intact.  ?   Right eye: Normal extraocular motion.  ?   Left eye: Normal extraocular motion.  ?   Conjunctiva/sclera:  ?   Right eye: Right conjunctiva is injected. Hemorrhage present.  ?   Comments: Right bi palpebral edema, no erythema. ?Epiphora, severe photophobia (limiting eye exam due to pain). ?Conjunctival edema. ?Cannot see pupil well,seems to be reactive.  ?Cardiovascular:  ?   Rate and Rhythm: Normal rate and regular rhythm.  ?Pulmonary:  ?   Effort: Pulmonary effort is normal. No respiratory distress.  ?   Breath sounds: Normal breath sounds.  ?Lymphadenopathy:  ?   Head:  ?   Right side of head: Preauricular (Tender.) adenopathy present.  ?   Left side of head: No preauricular adenopathy.  ?Neurological:  ?   General: No focal deficit present.  ?   Mental Status: She is alert.  ?   Cranial Nerves: No cranial nerve deficit.  ?   Gait: Gait normal.  ?Psychiatric:     ?   Mood and Affect: Mood is anxious.  ? ?ASSESSMENT AND PLAN: ? ?[name] was seen today for follow-up. ? ?Diagnoses and all orders for this visit: ?Orders Placed This Encounter  ?Procedures  ? Ambulatory referral to Ophthalmology  ? ?Acute right eye pain ?Problem is getting worse.We discussed possible etiologies, keratoconjunctivitis, uveitis among some to consider. ?Pain management with Percocet. ?Urgent ophthalmology referral was placed. ?She was clearly instructed about warning signs. ? ?-     oxyCODONE-acetaminophen (PERCOCET/ROXICET) 5-325 MG tablet; Take 1 tablet by mouth every 8 (eight) hours as needed for up to 3 days for severe pain. ? ?Acute conjunctivitis of right eye, unspecified acute conjunctivitis type ?Poor response to topical antibiotics. ?Could be viral or inflammatory. ?Because severe eye pain and photophobia urgent referral to ophthalmologist was placed. She has an appt today. ? ?Orders Placed This Encounter  ?Procedures  ? Ambulatory referral to Ophthalmology  ? ?Vision Screening  ? Right eye Left eye Both eyes   ?Without correction '20/70 20/40 20/40 '$  ?With correction     ? ?Return if symptoms worsen or fail to improve. ? ?Evalene Vath G. [name], MD ? ?[place]. ?[place] office. ? ?

## 2022-03-25 NOTE — Patient Instructions (Addendum)
A few things to remember from today's visit: ? ?Acute right eye pain - Plan: Ambulatory referral to Ophthalmology, oxyCODONE-acetaminophen (PERCOCET/ROXICET) 5-325 MG tablet ? ?Acute conjunctivitis of right eye, unspecified acute conjunctivitis type ? ?If you need refills please call your pharmacy. ?Do not use My Chart to request refills or for acute issues that need immediate attention. ?  ?A more extensive examination is needed to evaluate for more serious process. ?Appt with ophthalmologist is been arranged. ?Percocet for pain 3 times per day. ? ?Please be sure medication list is accurate. ?If a new problem present, please set up appointment sooner than planned today. ? ? ? ? ? ? ? ?

## 2022-04-14 ENCOUNTER — Other Ambulatory Visit: Payer: Self-pay | Admitting: Hematology and Oncology

## 2022-04-14 DIAGNOSIS — F320 Major depressive disorder, single episode, mild: Secondary | ICD-10-CM

## 2022-05-12 ENCOUNTER — Inpatient Hospital Stay: Payer: 59 | Attending: Hematology and Oncology | Admitting: Hematology and Oncology

## 2022-05-12 NOTE — Assessment & Plan Note (Deleted)
Right lumpectomy: IDC with DCIS, 1.4 cm, grade 3, DCIS focally involving posterior margin, 0/3 lymph nodes negative, ER 90%, PR 0%, HER-2 positive ratio 2.6, Ki-67 40% T1 CN 0 stage IA Tumor board did not recommend surgery for the positive posterior margin issue  Recommendation: 1.Adjuvant chemotherapy with [place] followed by Herceptin maintenance for a yearstarted 09/07/2018-12/21/2017 3.Adjuvant radiationstarted 01/19/2018 4. Followed by adjuvant antiestrogen therapy with tamoxifen that was started prior to surgery -------------------------------------------------------------------- Current Treatment: Tamoxifen 20 mg daily started 08/12/18 Tamoxifen Toxicities: 1. Severe emotional outbursts: Currently on effexor XR 75 mg and she is doing very well on that. Hot flashes have also improved dramatically.  Breast Cancer Surveillance:  Mammogram 09/11/2019: Benign, breast density category C Breast exam 05/12/22: Benign RTC in 1 year for followupor sooner if needed

## 2022-05-13 ENCOUNTER — Other Ambulatory Visit: Payer: Self-pay | Admitting: Hematology and Oncology

## 2022-05-13 DIAGNOSIS — F320 Major depressive disorder, single episode, mild: Secondary | ICD-10-CM

## 2022-05-15 ENCOUNTER — Telehealth: Payer: Self-pay | Admitting: Hematology and Oncology

## 2022-06-19 ENCOUNTER — Other Ambulatory Visit: Payer: Self-pay | Admitting: Hematology and Oncology

## 2022-06-19 DIAGNOSIS — F320 Major depressive disorder, single episode, mild: Secondary | ICD-10-CM

## 2022-06-22 NOTE — Progress Notes (Signed)
Patient Care Team: [name], [name], MD as PCP - General (Family Medicine) Nicholas Lose, MD as Consulting Physician (Hematology and Oncology) Donnie Mesa, MD as Consulting Physician (General Surgery) Kyung Rudd, MD as Consulting Physician (Radiation Oncology) Delice Bison Charlestine Massed, NP as Nurse Practitioner (Hematology and Oncology)  DIAGNOSIS:  Encounter Diagnoses  Name Primary?   Malignant neoplasm of upper-outer quadrant of right breast in female, estrogen receptor positive ([place])    Depression, major, single episode, mild ([place])     SUMMARY OF ONCOLOGIC HISTORY: Oncology History  Malignant neoplasm of upper-outer quadrant of right breast in female, estrogen receptor positive ([place])  07/15/2017 Initial Diagnosis   Patient palpated a week mass in the right upper outer quadrant breast near the axilla in March 2018 mammogram revealed irregular spiculated mass 1.4 cm, by ultrasound measured 1.1 cm with a suspicious right axillary lymph node; biopsy revealed IDC grade 3, lymph node benign, ER 90%, PR 0%, Ki-67 40%, HER-2 positive ratio 2.6, T1c N0 stage IA, AJCC 8 clinical stage   08/06/2017 Genetic Testing   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  The Hereditary Gene Panel offered by [facility] includes sequencing and/or deletion duplication testing of the following 46 genes: APC, ATM, AXIN2, BARD1, BMPR1A, BRCA1, BRCA2, BRIP1, CDH1, CDKN2A (p14ARF), CDKN2A (p16INK4a), CHEK2, CTNNA1, DICER1, EPCAM (Deletion/duplication testing only), GREM1 (promoter region deletion/duplication testing only), KIT, MEN1, MLH1, MSH2, MSH3, MSH6, MUTYH, NBN, NF1, NHTL1, PALB2, PDGFRA, PMS2, POLD1, POLE, PTEN, RAD50, RAD51C, RAD51D, SDHB, SDHC, SDHD, SMAD4, SMARCA4. STK11, TP53, TSC1, TSC2, and VHL.  The following genes were evaluated for sequence changes only: SDHA and HOXB13 c.251G>A variant only.   The report date is August 06, 2017.  UPDATE:  SDHB c.65G>C  (p.Cys22Ser) VUS has been reclassified to LIkely Benign as a result of re-review of the evidence in light of new variant interpretation guidelines and/or new information.  The updated report date is April 26, 2019.    08/11/2017 Surgery   Right lumpectomy: IDC with DCIS, 1.4 cm, grade 3, DCIS focally involving posterior margin, 0/3 lymph nodes negative, ER 90%, PR 0%, HER-2 positive ratio 2.6, Ki-67 40% T1 CN 0 stage IA   09/07/2017 - 12/21/2017 Adjuvant Chemotherapy   TCH x 6, then maintenance Herceptin x 1 year (Taxotere not given 4 cycles 5 and 6)   01/19/2018 - 03/04/2018 Radiation Therapy   Adjuvant radiation therapy   08/12/2018 -  Anti-estrogen oral therapy   Tamoxifen     CHIEF COMPLIANT: Follow-up of right breast cancer on tamoxifen    INTERVAL HISTORY: Brianna [name] is 41 y.o. with above-mentioned history of right breast cancer currently on tamoxifen. She presents to the clinic today for a follow-up. She states that she is doing good. She had some concerns about the Effexor. She feels like when she hormonal she goes off the deep end. She complains of when she doesn't take it she feels off and she also has a headache. She also is not getting a regular cycle. Denies pain and discomfort in breast. Hot flashes are manageable. She doesn't get time for exercise.   ALLERGIES:  has No Known Allergies.  MEDICATIONS:  Current Outpatient Medications  Medication Sig Dispense Refill   tamoxifen (NOLVADEX) 20 MG tablet Take 1 tablet (20 mg total) by mouth daily. 90 tablet 3   venlafaxine XR (EFFEXOR-XR) 150 MG 24 hr capsule Take 1 capsule (150 mg total) by mouth daily with breakfast. 90 capsule 3   No current  facility-administered medications for this visit.   Facility-Administered Medications Ordered in Other Visits  Medication Dose Route Frequency Provider Last Rate Last Admin   sodium chloride flush (NS) 0.9 % injection 10 mL  10 mL Intracatheter PRN Nicholas Lose, MD   10 mL at  07/20/18 1536    PHYSICAL EXAMINATION: ECOG PERFORMANCE STATUS: 1 - Symptomatic but completely ambulatory  Vitals:   06/30/22 1446  BP: 117/75  Pulse: 79  Temp: (!) 97.5 F (36.4 C)  SpO2: 100%   Filed Weights   06/30/22 1446  Weight: 148 lb 8 oz (67.4 kg)    BREAST: No palpable masses or nodules in either right or left breasts. No palpable axillary supraclavicular or infraclavicular adenopathy no breast tenderness or nipple discharge. (exam performed in the presence of a chaperone)  LABORATORY DATA:  I have reviewed the data as listed    Latest Ref Rng & Units 07/04/2020    6:02 PM 08/14/2019   10:57 AM 09/15/2018    1:16 PM  CMP  Glucose 70 - 99 mg/dL 106  102  89   BUN 6 - 20 mg/dL _0 Creatinine 0.44 - 1.00 mg/dL 0.76  0.81  0.96   Sodium 135 - 145 mmol/L 136  140  137   Potassium 3.5 - 5.1 mmol/L 3.4  4.1  5.4   Chloride 98 - 111 mmol/L 104  104  108   CO2 22 - 32 mmol/L _1 Calcium 8.9 - 10.3 mg/dL 8.8  9.4  9.0   Total Protein 6.5 - 8.1 g/dL 6.9     Total Bilirubin 0.3 - 1.2 mg/dL 0.3     Alkaline Phos 38 - 126 U/L 47     AST 15 - 41 U/L 24     ALT 0 - 44 U/L 21       Lab Results  Component Value Date   WBC 3.2 (L) 07/04/2020   HGB 12.0 07/04/2020   HCT 36.3 07/04/2020   MCV 91.2 07/04/2020   PLT 265 07/04/2020   NEUTROABS 1.8 07/04/2020    ASSESSMENT & PLAN:  Malignant neoplasm of upper-outer quadrant of right breast in female, estrogen receptor positive ([place]) Right lumpectomy: IDC with DCIS, 1.4 cm, grade 3, DCIS focally involving posterior margin, 0/3 lymph nodes negative, ER 90%, PR 0%, HER-2 positive ratio 2.6, Ki-67 40% T1 CN 0 stage IA Tumor board did not recommend surgery for the positive posterior margin issue   Recommendation: 1. Adjuvant chemotherapy with [place] followed by Herceptin maintenance for a year started 09/07/2018-12/21/2017  3. Adjuvant radiation started 01/19/2018 4. Followed by adjuvant antiestrogen therapy with  tamoxifen that was started prior to surgery -------------------------------------------------------------------- Current Treatment: Tamoxifen 20 mg daily started 08/12/18 Tamoxifen Toxicities: 1. Severe emotional outbursts: Patient request increase the dosage to 150 mg daily.  I sent a new prescription for that dose today.  Hot flashes have also improved dramatically.   Breast Cancer Surveillance:  Mammogram  November 2022: Benign, breast density category C Breast exam 06/30/2022: Benign  RTC in 1 year for followup or sooner if needed.    No orders of the defined types were placed in this encounter.  The patient has a good understanding of the overall plan. she agrees with it. she will call with any problems that may develop before the next visit here. Total time spent: 30 mins including face to face time and time spent for planning, charting and  co-ordination of care   Harriette Ohara, MD 06/30/22    I Gardiner Coins am scribing for Dr. Lindi Adie  I have reviewed the above documentation for accuracy and completeness, and I agree with the above.

## 2022-06-30 ENCOUNTER — Other Ambulatory Visit: Payer: Self-pay

## 2022-06-30 ENCOUNTER — Inpatient Hospital Stay: Payer: 59 | Attending: Hematology and Oncology | Admitting: Hematology and Oncology

## 2022-06-30 DIAGNOSIS — Z170 Estrogen receptor positive status [ER+]: Secondary | ICD-10-CM | POA: Insufficient documentation

## 2022-06-30 DIAGNOSIS — Z79810 Long term (current) use of selective estrogen receptor modulators (SERMs): Secondary | ICD-10-CM | POA: Insufficient documentation

## 2022-06-30 DIAGNOSIS — C50411 Malignant neoplasm of upper-outer quadrant of right female breast: Secondary | ICD-10-CM | POA: Insufficient documentation

## 2022-06-30 DIAGNOSIS — F320 Major depressive disorder, single episode, mild: Secondary | ICD-10-CM | POA: Diagnosis not present

## 2022-06-30 MED ORDER — TAMOXIFEN CITRATE 20 MG PO TABS: 20.0000 mg | ORAL_TABLET | Freq: Every day | ORAL | 3 refills | Status: DC

## 2022-06-30 MED ORDER — VENLAFAXINE HCL ER 150 MG PO CP24: 150.0000 mg | ORAL_CAPSULE | Freq: Every day | ORAL | 3 refills | Status: DC

## 2022-06-30 NOTE — Assessment & Plan Note (Addendum)
Right lumpectomy: IDC with DCIS, 1.4 cm, grade 3, DCIS focally involving posterior margin, 0/3 lymph nodes negative, ER 90%, PR 0%, HER-2 positive ratio 2.6, Ki-67 40% T1 CN 0 stage IA Tumor board did not recommend surgery for the positive posterior margin issue  Recommendation: 1.Adjuvant chemotherapy with [place] followed by Herceptin maintenance for a yearstarted 09/07/2018-12/21/2017 3.Adjuvant radiationstarted 01/19/2018 4. Followed by adjuvant antiestrogen therapy with tamoxifen that was started prior to surgery -------------------------------------------------------------------- Current Treatment: Tamoxifen 20 mg daily started 08/12/18 Tamoxifen Toxicities: 1. Severe emotional outbursts: Patient request increase the dosage to 150 mg daily.  I sent a new prescription for that dose today.  Hot flashes have also improved dramatically.  Breast Cancer Surveillance:  Mammogram  November 2022: Benign, breast density category C Breast exam 06/30/2022: Benign  RTC in 1 year for followupor sooner if needed.

## 2022-07-21 ENCOUNTER — Other Ambulatory Visit: Payer: Self-pay | Admitting: Hematology and Oncology

## 2022-07-21 DIAGNOSIS — F320 Major depressive disorder, single episode, mild: Secondary | ICD-10-CM

## 2022-12-15 ENCOUNTER — Other Ambulatory Visit: Payer: Self-pay | Admitting: Hematology and Oncology

## 2022-12-15 DIAGNOSIS — Z853 Personal history of malignant neoplasm of breast: Secondary | ICD-10-CM

## 2022-12-24 ENCOUNTER — Ambulatory Visit
Admission: RE | Admit: 2022-12-24 | Discharge: 2022-12-24 | Disposition: A | Discharge status: Home / Self Care | Payer: 59 | Source: Ambulatory Visit | Attending: Hematology and Oncology | Admitting: Hematology and Oncology

## 2022-12-24 ENCOUNTER — Other Ambulatory Visit: Payer: Self-pay | Admitting: Hematology and Oncology

## 2022-12-24 DIAGNOSIS — Z853 Personal history of malignant neoplasm of breast: Secondary | ICD-10-CM

## 2022-12-28 ENCOUNTER — Other Ambulatory Visit: Payer: Self-pay | Admitting: *Deleted

## 2022-12-28 DIAGNOSIS — C50411 Malignant neoplasm of upper-outer quadrant of right female breast: Secondary | ICD-10-CM

## 2022-12-28 NOTE — Progress Notes (Signed)
Recent screening mammogram showed possible distortion in left breast with recommendations of Diagnostic mammogram and [place]. Verbal ordered received from MD.  Pt educated and verbalized understanding stating she will call the breast center to schedule an appt.

## 2023-01-07 ENCOUNTER — Ambulatory Visit
Admission: RE | Admit: 2023-01-07 | Discharge: 2023-01-07 | Disposition: A | Discharge status: Home / Self Care | Payer: 59 | Source: Ambulatory Visit | Attending: Hematology and Oncology | Admitting: Hematology and Oncology

## 2023-01-07 DIAGNOSIS — Z170 Estrogen receptor positive status [ER+]: Secondary | ICD-10-CM

## 2023-02-22 LAB — HM PAP SMEAR: HM Pap smear: NORMAL

## 2023-06-29 NOTE — Progress Notes (Signed)
Patient Care Team: [place], [name], MD as PCP - General (Family Medicine) Serena Croissant, MD as Consulting Physician (Hematology and Oncology) Manus Rudd, MD as Consulting Physician (General Surgery) Dorothy Puffer, MD as Consulting Physician (Radiation Oncology) Axel Filler Larna Daughters, NP as Nurse Practitioner (Hematology and Oncology)  DIAGNOSIS: No diagnosis found.  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  The Hereditary Gene Panel offered by [facility] includes sequencing and/or deletion duplication testing of the following 46 genes: APC, ATM, AXIN2, BARD1, BMPR1A, BRCA1, BRCA2, BRIP1, CDH1, CDKN2A (p14ARF), CDKN2A (p16INK4a), CHEK2, CTNNA1, DICER1, EPCAM (Deletion/duplication testing only), GREM1 (promoter region deletion/duplication testing only), KIT, MEN1, MLH1, MSH2, MSH3, MSH6, MUTYH, NBN, NF1, NHTL1, PALB2, PDGFRA, PMS2, POLD1, POLE, PTEN, RAD50, RAD51C, RAD51D, SDHB, SDHC, SDHD, SMAD4, SMARCA4. STK11, TP53, TSC1, TSC2, and VHL.  The following genes were evaluated for sequence changes only: SDHA and HOXB13 c.251G>A variant only.   The report date is August 06, 2017.  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  The updated  report date is April 26, 2019.    08/11/2017 Surgery   Right lumpectomy: IDC with DCIS, 1.4 cm, grade 3, DCIS focally involving posterior margin, 0/3 lymph nodes negative, ER 90%, PR 0%, HER-2 positive ratio 2.6, Ki-67 40% T1 CN 0 stage IA   09/07/2017 - 12/21/2017 Adjuvant Chemotherapy   TCH x 6, then maintenance Herceptin x 1 year (Taxotere not given 4 cycles 5 and 6)   01/19/2018 - 03/04/2018 Radiation Therapy   Adjuvant radiation therapy   08/12/2018 -  Anti-estrogen oral therapy   Tamoxifen     CHIEF COMPLIANT: Follow-up of right breast cancer on tamoxifen   INTERVAL HISTORY: Brianna [name] is a 42 y.o. with above-mentioned history of right breast cancer currently on tamoxifen. She presents to the clinic today for a follow-up.    ALLERGIES:  has No Known Allergies.  MEDICATIONS:  Current Outpatient Medications  Medication Sig Dispense Refill   tamoxifen (NOLVADEX) 20 MG tablet Take 1 tablet (20 mg total) by mouth daily. 90 tablet 3   venlafaxine XR (EFFEXOR-XR) 150 MG 24 hr capsule Take 1 capsule (150 mg total) by mouth daily with breakfast. 90 capsule 3   No current facility-administered medications for this visit.   Facility-Administered Medications Ordered in Other Visits  Medication Dose Route Frequency Provider Last Rate Last Admin   sodium chloride flush (NS) 0.9 % injection 10 mL  10 mL Intracatheter PRN Serena Croissant, MD   10 mL at 07/20/18 1536    PHYSICAL EXAMINATION: ECOG PERFORMANCE STATUS: {CHL ONC ECOG PS:[phone]}  There were no vitals filed for this visit. There were no vitals filed for this visit.  BREAST:*** No palpable masses or nodules in either right or left breasts. No palpable axillary supraclavicular or infraclavicular  adenopathy no breast tenderness or nipple discharge. (exam performed in the presence of a chaperone)  LABORATORY DATA:  I have reviewed the data as listed    Latest Ref Rng & Units 07/04/2020    6:02 PM 08/14/2019   10:57 AM  09/15/2018    1:16 PM  CMP  Glucose 70 - 99 mg/dL 409  811  89   BUN 6 - 20 mg/dL 6  10  11    Creatinine 0.44 - 1.00 mg/dL 9.14  7.82  9.56   Sodium 135 - 145 mmol/L 136  140  137   Potassium 3.5 - 5.1 mmol/L 3.4  4.1  5.4   Chloride 98 - 111 mmol/L 104  104  108   CO2 22 - 32 mmol/L 24  29  22    Calcium 8.9 - 10.3 mg/dL 8.8  9.4  9.0   Total Protein 6.5 - 8.1 g/dL 6.9     Total Bilirubin 0.3 - 1.2 mg/dL 0.3     Alkaline Phos 38 - 126 U/L 47     AST 15 - 41 U/L 24     ALT 0 - 44 U/L 21       Lab Results  Component Value Date   WBC 3.2 (L) 07/04/2020   HGB 12.0 07/04/2020   HCT 36.3 07/04/2020   MCV 91.2 07/04/2020   PLT 265 07/04/2020   NEUTROABS 1.8 07/04/2020    ASSESSMENT & PLAN:  No problem-specific Assessment & Plan notes found for this encounter.    No orders of the defined types were placed in this encounter.  The patient has a good understanding of the overall plan. she agrees with it. she will call with any problems that may develop before the next visit here. Total time spent: 30 mins including face to face time and time spent for planning, charting and co-ordination of care   Sherlyn Lick, CMA 06/29/23    I Janan Ridge am acting as a Neurosurgeon for [employer]  ***

## 2023-07-01 ENCOUNTER — Inpatient Hospital Stay: Payer: 59 | Attending: Hematology and Oncology | Admitting: Hematology and Oncology

## 2023-07-01 ENCOUNTER — Other Ambulatory Visit: Payer: Self-pay

## 2023-07-01 VITALS — BP 109/67 | HR 98 | Temp 97.3°F | Resp 18 | Ht 63.0 in | Wt 137.8 lb

## 2023-07-01 DIAGNOSIS — Z79810 Long term (current) use of selective estrogen receptor modulators (SERMs): Secondary | ICD-10-CM | POA: Insufficient documentation

## 2023-07-01 DIAGNOSIS — Z170 Estrogen receptor positive status [ER+]: Secondary | ICD-10-CM | POA: Insufficient documentation

## 2023-07-01 DIAGNOSIS — F320 Major depressive disorder, single episode, mild: Secondary | ICD-10-CM | POA: Diagnosis not present

## 2023-07-01 DIAGNOSIS — C50411 Malignant neoplasm of upper-outer quadrant of right female breast: Secondary | ICD-10-CM | POA: Insufficient documentation

## 2023-07-01 MED ORDER — TAMOXIFEN CITRATE 20 MG PO TABS: 20.0000 mg | ORAL_TABLET | Freq: Every day | ORAL | 3 refills | Status: DC

## 2023-07-01 MED ORDER — VENLAFAXINE HCL ER 150 MG PO CP24: 150.0000 mg | ORAL_CAPSULE | Freq: Every day | ORAL | 3 refills | Status: DC

## 2023-07-01 NOTE — Assessment & Plan Note (Signed)
Right lumpectomy: IDC with DCIS, 1.4 cm, grade 3, DCIS focally involving posterior margin, 0/3 lymph nodes negative, ER 90%, PR 0%, HER-2 positive ratio 2.6, Ki-67 40% T1 CN 0 stage IA Tumor board did not recommend surgery for the positive posterior margin issue   Recommendation: 1. Adjuvant chemotherapy with TCH followed by Herceptin maintenance for a year started 09/07/2018-12/21/2017  3. Adjuvant radiation started 01/19/2018 4. Followed by adjuvant antiestrogen therapy with tamoxifen that was started prior to surgery -------------------------------------------------------------------- Current Treatment: Tamoxifen 20 mg daily started 08/12/18 Tamoxifen Toxicities: 1. Severe emotional outbursts: Patient request increase the dosage to 150 mg daily.  I sent a new prescription for that dose today.   Hot flashes have also improved dramatically.   Breast Cancer Surveillance:  Mammogram and ultrasound 01/07/2023: Benign, breast density category C Breast exam 07/01/2023: Benign   RTC in 1 year for followup or sooner if needed.

## 2023-07-17 ENCOUNTER — Other Ambulatory Visit: Payer: Self-pay | Admitting: Hematology and Oncology

## 2023-07-17 DIAGNOSIS — F320 Major depressive disorder, single episode, mild: Secondary | ICD-10-CM

## 2023-09-22 ENCOUNTER — Ambulatory Visit (INDEPENDENT_AMBULATORY_CARE_PROVIDER_SITE_OTHER): Payer: 59 | Admitting: Family Medicine

## 2023-09-22 ENCOUNTER — Encounter: Payer: Self-pay | Admitting: Family Medicine

## 2023-09-22 VITALS — BP 120/70 | HR 76 | Temp 98.4°F | Resp 12 | Ht 63.0 in | Wt 130.0 lb

## 2023-09-22 DIAGNOSIS — F411 Generalized anxiety disorder: Secondary | ICD-10-CM

## 2023-09-22 DIAGNOSIS — R7303 Prediabetes: Secondary | ICD-10-CM

## 2023-09-22 DIAGNOSIS — Z23 Encounter for immunization: Secondary | ICD-10-CM | POA: Diagnosis not present

## 2023-09-22 DIAGNOSIS — Z1159 Encounter for screening for other viral diseases: Secondary | ICD-10-CM

## 2023-09-22 DIAGNOSIS — Z13220 Encounter for screening for lipoid disorders: Secondary | ICD-10-CM

## 2023-09-22 DIAGNOSIS — E7800 Pure hypercholesterolemia, unspecified: Secondary | ICD-10-CM | POA: Insufficient documentation

## 2023-09-22 DIAGNOSIS — Z0000 Encounter for general adult medical examination without abnormal findings: Secondary | ICD-10-CM | POA: Diagnosis not present

## 2023-09-22 NOTE — Patient Instructions (Addendum)
A few things to remember from today's visit:  Prediabetes - Plan: Comprehensive metabolic panel, Hemoglobin A1c  Routine general medical examination at a health care facility  Screening for lipoid disorders - Plan: Lipid panel  Encounter for HCV screening test for low risk patient - Plan: Hepatitis C antibody  GAD (generalized anxiety disorder) - Plan: TSH  Try to arrange an appt with psychotherapist to help with work related anxiety.  Do not use My Chart to request refills or for acute issues that need immediate attention. If you send a my chart message, it may take a few days to be addressed, specially if I am not in the office.  Please be sure medication list is accurate. If a new problem present, please set up appointment sooner than planned today.  Health Maintenance, Female Adopting a healthy lifestyle and getting preventive care are important in promoting health and wellness. Ask your health care provider about: The right schedule for you to have regular tests and exams. Things you can do on your own to prevent diseases and keep yourself healthy. What should I know about diet, weight, and exercise? Eat a healthy diet  Eat a diet that includes plenty of vegetables, fruits, low-fat dairy products, and lean protein. Do not eat a lot of foods that are high in solid fats, added sugars, or sodium. Maintain a healthy weight Body mass index (BMI) is used to identify weight problems. It estimates body fat based on height and weight. Your health care provider can help determine your BMI and help you achieve or maintain a healthy weight. Get regular exercise Get regular exercise. This is one of the most important things you can do for your health. Most adults should: Exercise for at least 150 minutes each week. The exercise should increase your heart rate and make you sweat (moderate-intensity exercise). Do strengthening exercises at least twice a week. This is in addition to the  moderate-intensity exercise. Spend less time sitting. Even light physical activity can be beneficial. Watch cholesterol and blood lipids Have your blood tested for lipids and cholesterol at 43 years of age, then have this test every 5 years. Have your cholesterol levels checked more often if: Your lipid or cholesterol levels are high. You are older than 43 years of age. You are at high risk for heart disease. What should I know about cancer screening? Depending on your health history and family history, you may need to have cancer screening at various ages. This may include screening for: Breast cancer. Cervical cancer. Colorectal cancer. Skin cancer. Lung cancer. What should I know about heart disease, diabetes, and high blood pressure? Blood pressure and heart disease High blood pressure causes heart disease and increases the risk of stroke. This is more likely to develop in people who have high blood pressure readings or are overweight. Have your blood pressure checked: Every 3-5 years if you are 67-32 years of age. Every year if you are 38 years old or older. Diabetes Have regular diabetes screenings. This checks your fasting blood sugar level. Have the screening done: Once every three years after age 67 if you are at a normal weight and have a low risk for diabetes. More often and at a younger age if you are overweight or have a high risk for diabetes. What should I know about preventing infection? Hepatitis B If you have a higher risk for hepatitis B, you should be screened for this virus. Talk with your health care provider to find out  if you are at risk for hepatitis B infection. Hepatitis C Testing is recommended for: Everyone born from [birth year] through 1966. Anyone with known risk factors for hepatitis C. Sexually transmitted infections (STIs) Get screened for STIs, including gonorrhea and chlamydia, if: You are sexually active and are younger than 43 years of age. You are  older than 43 years of age and your health care provider tells you that you are at risk for this type of infection. Your sexual activity has changed since you were last screened, and you are at increased risk for chlamydia or gonorrhea. Ask your health care provider if you are at risk. Ask your health care provider about whether you are at high risk for HIV. Your health care provider may recommend a prescription medicine to help prevent HIV infection. If you choose to take medicine to prevent HIV, you should first get tested for HIV. You should then be tested every 3 months for as long as you are taking the medicine. Pregnancy If you are about to stop having your period (premenopausal) and you may become pregnant, seek counseling before you get pregnant. Take 400 to 800 micrograms (mcg) of folic acid every day if you become pregnant. Ask for birth control (contraception) if you want to prevent pregnancy. Osteoporosis and menopause Osteoporosis is a disease in which the bones lose minerals and strength with aging. This can result in bone fractures. If you are 28 years old or older, or if you are at risk for osteoporosis and fractures, ask your health care provider if you should: Be screened for bone loss. Take a calcium or vitamin D supplement to lower your risk of fractures. Be given hormone replacement therapy (HRT) to treat symptoms of menopause. Follow these instructions at home: Alcohol use Do not drink alcohol if: Your health care provider tells you not to drink. You are pregnant, may be pregnant, or are planning to become pregnant. If you drink alcohol: Limit how much you have to: 0-1 drink a day. Know how much alcohol is in your drink. In the [place] one drink equals one 12 oz bottle of beer (355 mL), one 5 oz glass of wine (148 mL), or one 1 oz glass of hard liquor (44 mL). Lifestyle Do not use any products that contain nicotine or tobacco. These products include cigarettes, chewing  tobacco, and vaping devices, such as e-cigarettes. If you need help quitting, ask your health care provider. Do not use street drugs. Do not share needles. Ask your health care provider for help if you need support or information about quitting drugs. General instructions Schedule regular health, dental, and eye exams. Stay current with your vaccines. Tell your health care provider if: You often feel depressed. You have ever been abused or do not feel safe at home. Summary Adopting a healthy lifestyle and getting preventive care are important in promoting health and wellness. Follow your health care provider's instructions about healthy diet, exercising, and getting tested or screened for diseases. Follow your health care provider's instructions on monitoring your cholesterol and blood pressure. This information is not intended to replace advice given to you by your health care provider. Make sure you discuss any questions you have with your health care provider. Document Revised: 03/31/2021 Document Reviewed: 03/31/2021 Elsevier Patient Education  2024 [employer].

## 2023-09-22 NOTE — Progress Notes (Signed)
HPI: Brianna [name] is a 42 y.o. female with a PMHx significant for IBS, depression, prediabetes, superficial venous thrombosis, and right breast cancer, who is here today for her routine physical.  Last CPE: 08/14/2019 She follows with her oncologist annually.   Exercise: She says she walks 30-60 minutes in the morning regularly, but hasn't for a month due to recent travel.  Diet: She is trying to follow a healthy diet. She is intermittent fasting with fruit and almonds for snacks.  Sleep: She says she is sleeping 7-8 hours per night.  Alcohol/Drug Use: She states she has stopped drinking but has been using CBD from edibles.  Smoking: never Vision: She has not had an eye exam in years. Dental: UTD on routine dental care.   Immunization History  Administered Date(s) Administered   Tdap 08/14/2019   Health Maintenance  Topic Date Due   HIV Screening  Never done   Hepatitis C Screening  Never done   Cervical Cancer Screening (HPV/Pap Cotest)  10/26/2017   COVID-19 Vaccine (2 - Pfizer risk series) 07/08/2020   INFLUENZA VACCINE  06/24/2023   DTaP/Tdap/Td (2 - Td or Tdap) 08/13/2029   HPV VACCINES  Aged Out   Chronic medical problems:   Hx of right breast cancer, s/p right lumpectomy and radiation. She is still taking Tamoxifen 20 mg daily prescribed by her oncologist. She has been on it for 5 years and plans to take it for 5 more.  She says she is having sporadic menstrual periods.   Mildly elevated cholesterol in the past. 4 years ago TC 201 and LDL 105.  Concerns today:   Anxiety:  She mentions she has been experiencing increased stress at work. She believes she needs some time off prn.  She has contacted the employee assistance program. She is still taking Effexor-XR 150 mg daily. Denies suicidal thoughts.  Review of Systems  Constitutional:  Negative for activity change, appetite change and fever.  HENT:  Negative for hearing loss, mouth sores, sore throat and  trouble swallowing.   Eyes:  Negative for redness and visual disturbance.  Respiratory:  Negative for cough, shortness of breath and wheezing.   Cardiovascular:  Negative for chest pain and leg swelling.  Gastrointestinal:  Negative for abdominal pain, nausea and vomiting.       No changes in bowel habits.  Endocrine: Negative for cold intolerance, heat intolerance, polydipsia, polyphagia and polyuria.  Genitourinary:  Negative for decreased urine volume, dysuria, hematuria, vaginal bleeding and vaginal discharge.  Musculoskeletal:  Negative for gait problem and myalgias.  Skin:  Negative for color change and rash.  Allergic/Immunologic: Negative for environmental allergies.  Neurological:  Negative for syncope, weakness and headaches.  Hematological:  Negative for adenopathy. Does not bruise/bleed easily.  Psychiatric/Behavioral:  Negative for confusion and hallucinations. The patient is nervous/anxious.   All other systems reviewed and are negative.  Current Outpatient Medications on File Prior to Visit  Medication Sig Dispense Refill   tamoxifen (NOLVADEX) 20 MG tablet Take 1 tablet (20 mg total) by mouth daily. 90 tablet 3   venlafaxine XR (EFFEXOR-XR) 150 MG 24 hr capsule TAKE 1 CAPSULE BY MOUTH DAILY WITH BREAKFAST. 30 capsule 5   Current Facility-Administered Medications on File Prior to Visit  Medication Dose Route Frequency Provider Last Rate Last Admin   sodium chloride flush (NS) 0.9 % injection 10 mL  10 mL Intracatheter PRN Serena Croissant, MD   10 mL at 07/20/18 1536    Past Medical  History:  Diagnosis Date   Acne    Depression    GERD (gastroesophageal reflux disease)    History of concussion 08/2016   IBS (irritable bowel syndrome)    no current med.   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   Personal history of chemotherapy    Personal history of radiation therapy    Pre-diabetes    Past Surgical History:  Procedure Laterality Date   BREAST  LUMPECTOMY Right 2018   BREAST LUMPECTOMY WITH RADIOACTIVE SEED AND SENTINEL LYMPH NODE BIOPSY Right 08/11/2017   Procedure: RIGHT BREAST LUMPECTOMY WITH RADIOACTIVE SEED AND RIGHT SENTINEL LYMPH NODE BIOPSY;  Surgeon: Manus Rudd, MD;  Location: [facility];  Service: General;  Laterality: Right;  ERAS PATHWAY   PORT-A-CATH REMOVAL N/A 09/15/2018   Procedure: REMOVAL PORT-A-CATH;  Surgeon: Manus Rudd, MD;  Location: [facility];  Service: General;  Laterality: N/A;   PORTACATH PLACEMENT Left 08/11/2017   Procedure: INSERTION PORT-A-CATH WITH ULTRA SOUND;  Surgeon: Manus Rudd, MD;  Location: [facility];  Service: General;  Laterality: Left;   WISDOM TOOTH EXTRACTION     WOUND EXPLORATION Right 04/19/2018   Procedure: RIGHT AXILLARY WOUND EXPLORATION;  Surgeon: Manus Rudd, MD;  Location: [facility];  Service: General;  Laterality: Right;    No Known Allergies  Family History  Problem Relation Age of Onset   Heart disease Maternal Grandmother        d.53   Stroke Maternal Grandfather    Diabetes Maternal Grandfather    Hyperlipidemia Mother    Diabetes Maternal Uncle    Heart attack Other    Obesity Other    COPD Other    Asthma Other    Breast cancer Other 54       Maternal grandfather's sister    Social History   Socioeconomic History   Marital status: Married    Spouse name: Not on file   Number of children: Not on file   Years of education: Not on file   Highest education level: Not on file  Occupational History   Not on file  Tobacco Use   Smoking status: Never   Smokeless tobacco: Never  Vaping Use   Vaping status: Never Used  Substance and Sexual Activity   Alcohol use: Yes    Comment: occasionally   Drug use: No   Sexual activity: Yes    Birth control/protection: None  Other Topics Concern   Not on file  Social History Narrative   Not on file   Social Determinants of Health   Financial Resource Strain:  Not on file  Food Insecurity: Not on file  Transportation Needs: Not on file  Physical Activity: Not on file  Stress: Not on file  Social Connections: Not on file   Today's Vitals   09/22/23 1406  BP: 120/70  Pulse: 76  Resp: 12  Temp: 98.4 F (36.9 C)  TempSrc: Oral  SpO2: 99%  Weight: 130 lb (59 kg)  Height: 5\' 3"  (1.6 m)   Body mass index is 23.03 kg/m.  Wt Readings from Last 3 Encounters:  07/01/23 137 lb 12.8 oz (62.5 kg)  06/30/22 148 lb 8 oz (67.4 kg)  03/25/22 152 lb 4 oz (69.1 kg)   Physical Exam Vitals and nursing note reviewed.  Constitutional:      General: She is not in acute distress.    Appearance: She is well-developed.  HENT:     Head: Normocephalic  and atraumatic.     Right Ear: Hearing, tympanic membrane, ear canal and external ear normal.     Left Ear: Hearing, tympanic membrane, ear canal and external ear normal.     Mouth/Throat:     Mouth: Mucous membranes are moist.     Pharynx: Oropharynx is clear. Uvula midline.  Eyes:     Extraocular Movements: Extraocular movements intact.     Conjunctiva/sclera: Conjunctivae normal.     Pupils: Pupils are equal, round, and reactive to light.  Neck:     Thyroid: No thyromegaly.     Trachea: No tracheal deviation.  Cardiovascular:     Rate and Rhythm: Normal rate and regular rhythm.     Pulses:          Dorsalis pedis pulses are 2+ on the right side and 2+ on the left side.     Heart sounds: No murmur heard. Pulmonary:     Effort: Pulmonary effort is normal. No respiratory distress.     Breath sounds: Normal breath sounds.  Abdominal:     Palpations: Abdomen is soft. There is no hepatomegaly or mass.     Tenderness: There is no abdominal tenderness.  Genitourinary:    Comments: Deferred to gyn. Musculoskeletal:     Right lower leg: No edema.     Left lower leg: No edema.     Comments: No major deformity or signs of synovitis appreciated.  Lymphadenopathy:     Cervical: No cervical adenopathy.      Upper Body:     Right upper body: No supraclavicular adenopathy.     Left upper body: No supraclavicular adenopathy.  Skin:    General: Skin is warm.     Findings: No erythema or rash.  Neurological:     General: No focal deficit present.     Mental Status: She is alert and oriented to person, place, and time.     Cranial Nerves: No cranial nerve deficit.     Coordination: Coordination normal.     Gait: Gait normal.     Deep Tendon Reflexes:     Reflex Scores:      Bicep reflexes are 2+ on the right side and 2+ on the left side.      Patellar reflexes are 2+ on the right side and 2+ on the left side. Psychiatric:        Mood and Affect: Affect normal. Mood is anxious.   ASSESSMENT AND PLAN:  Brianna [name] was here today for her annual physical examination.  Orders Placed This Encounter  Procedures   Flu vaccine trivalent PF, 6mos and older(Flulaval,Afluria,Fluarix,Fluzone)   Comprehensive metabolic panel   Hemoglobin A1c   Lipid panel   TSH   Hepatitis C antibody   Lab Results  Component Value Date   TSH 1.11 09/22/2023   Lab Results  Component Value Date   NA 138 09/22/2023   CL 103 09/22/2023   K 3.7 09/22/2023   CO2 25 09/22/2023   BUN 12 09/22/2023   CREATININE 0.81 09/22/2023   GFR 88.71 09/22/2023   CALCIUM 9.1 09/22/2023   ALBUMIN 4.2 09/22/2023   GLUCOSE 75 09/22/2023   Lab Results  Component Value Date   ALT 14 09/22/2023   AST 17 09/22/2023   ALKPHOS 38 (L) 09/22/2023   BILITOT 0.3 09/22/2023   Lab Results  Component Value Date   CHOL 202 (H) 09/22/2023   HDL 104.80 09/22/2023   LDLCALC 88 09/22/2023   TRIG  43.0 09/22/2023   CHOLHDL 2 09/22/2023   Lab Results  Component Value Date   HGBA1C 5.7 09/22/2023  Routine general medical examination at a health care facility Assessment & Plan: We discussed the importance of regular physical activity and healthy diet for prevention of chronic illness and/or complications. Preventive  guidelines reviewed. Vaccination up to date. Continue her female preventive care with her gynecologist. Pap smear up to date. Next CPE in a year.   Prediabetes Assessment & Plan: HgA1C has been up yo 6.1. Continue a healthy life style for diabetes prevention. Further recommendations according to HgA1C result.  Orders: -     Comprehensive metabolic panel; Future -     Hemoglobin A1c; Future  Pure hypercholesterolemia Assessment & Plan: Non pharmacologic treatment recommended for now. Further recommendations will be given according to 10 years CVD risk score and lipid panel numbers.  Orders: -     Lipid panel; Future  Encounter for HCV screening test for low risk patient -     Hepatitis C antibody; Future  GAD (generalized anxiety disorder) Assessment & Plan: She feels like problem is getting worse due to work related stress. Effexor is being prescribed by her oncologist, she is on 150 mg daily. Recommend CBT, information about providers at the [facility] center given.  Orders: -     TSH; Future  Need for influenza vaccination -     Flu vaccine trivalent PF, 6mos and older(Flulaval,Afluria,Fluarix,Fluzone)   Return in 1 year (on 09/21/2024) for CPE.  I, Rolla Etienne Wierda, acting as a scribe for Kristyna Bradstreet [place], MD., have documented all relevant documentation on the behalf of Clements Toro [place], MD, as directed by  Anais Koenen [place], MD while in the presence of Cerra Eisenhower [place], MD.   I, Suanne Marker, have reviewed all documentation for this visit. The documentation on 09/22/23 for the exam, diagnosis, procedures, and orders are all accurate and complete.  Cleona Doubleday G. [place], MD  [facility]. [place] office.

## 2023-09-23 LAB — COMPREHENSIVE METABOLIC PANEL
ALT: 14 U/L (ref 0–35)
AST: 17 U/L (ref 0–37)
Albumin: 4.2 g/dL (ref 3.5–5.2)
Alkaline Phosphatase: 38 U/L — ABNORMAL LOW (ref 39–117)
BUN: 12 mg/dL (ref 6–23)
CO2: 25 meq/L (ref 19–32)
Calcium: 9.1 mg/dL (ref 8.4–10.5)
Chloride: 103 meq/L (ref 96–112)
Creatinine, Ser: 0.81 mg/dL (ref 0.40–1.20)
GFR: 88.71 mL/min (ref >60.00)
Glucose, Bld: 75 mg/dL (ref 70–99)
Potassium: 3.7 meq/L (ref 3.5–5.1)
Sodium: 138 meq/L (ref 135–145)
Total Bilirubin: 0.3 mg/dL (ref 0.2–1.2)
Total Protein: 7.2 g/dL (ref 6.0–8.3)

## 2023-09-23 LAB — LIPID PANEL
Cholesterol: 202 mg/dL — ABNORMAL HIGH (ref 0–200)
HDL: 104.8 mg/dL (ref >39.00)
LDL Cholesterol: 88 mg/dL (ref 0–99)
NonHDL: 96.86
Total CHOL/HDL Ratio: 2
Triglycerides: 43 mg/dL (ref 0.0–149.0)
VLDL: 8.6 mg/dL (ref 0.0–40.0)

## 2023-09-23 LAB — TSH: TSH: 1.11 u[IU]/mL (ref 0.35–5.50)

## 2023-09-23 LAB — HEMOGLOBIN A1C: Hgb A1c MFr Bld: 5.7 % (ref 4.6–6.5)

## 2023-09-23 LAB — HEPATITIS C ANTIBODY: Hepatitis C Ab: NONREACTIVE

## 2023-09-25 NOTE — Assessment & Plan Note (Signed)
Non pharmacologic treatment recommended for now. Further recommendations will be given according to 10 years CVD risk score and lipid panel numbers. 

## 2023-09-25 NOTE — Assessment & Plan Note (Addendum)
HgA1C has been up yo 6.1. Continue a healthy life style for diabetes prevention. Further recommendations according to HgA1C result.

## 2023-09-25 NOTE — Assessment & Plan Note (Signed)
We discussed the importance of regular physical activity and healthy diet for prevention of chronic illness and/or complications. Preventive guidelines reviewed. Vaccination up to date. Continue her female preventive care with her gynecologist. Pap smear up to date. Next CPE in a year.

## 2023-09-25 NOTE — Assessment & Plan Note (Signed)
She feels like problem is getting worse due to work related stress. Effexor is being prescribed by her oncologist, she is on 150 mg daily. Recommend CBT, information about providers at the [facility] center given.

## 2024-01-05 ENCOUNTER — Other Ambulatory Visit: Payer: Self-pay | Admitting: Hematology and Oncology

## 2024-01-05 DIAGNOSIS — Z9889 Other specified postprocedural states: Secondary | ICD-10-CM

## 2024-01-12 ENCOUNTER — Telehealth: Payer: Self-pay | Admitting: *Deleted

## 2024-01-12 ENCOUNTER — Ambulatory Visit
Admission: RE | Admit: 2024-01-12 | Discharge: 2024-01-12 | Discharge status: Home / Self Care | Payer: 59 | Source: Ambulatory Visit | Attending: Hematology and Oncology

## 2024-01-12 ENCOUNTER — Other Ambulatory Visit: Payer: Self-pay | Admitting: Hematology and Oncology

## 2024-01-12 DIAGNOSIS — Z9889 Other specified postprocedural states: Secondary | ICD-10-CM

## 2024-01-12 NOTE — Telephone Encounter (Signed)
 Received call from pt with complaint of severe emotional outbursts at work and is currently under FMLA due to anger issues.  Pt requesting appt with MD to discuss Effexor dose and plans to move forward.  Telephone visit scheduled with MD to further discuss. Telephone visit scheduled, pt educated and verbalized understanding.

## 2024-01-14 ENCOUNTER — Telehealth: Payer: Self-pay | Admitting: Hematology and Oncology

## 2024-01-14 ENCOUNTER — Inpatient Hospital Stay: Payer: 59 | Attending: Hematology and Oncology | Admitting: Hematology and Oncology

## 2024-01-14 DIAGNOSIS — Z170 Estrogen receptor positive status [ER+]: Secondary | ICD-10-CM | POA: Diagnosis not present

## 2024-01-14 DIAGNOSIS — C50411 Malignant neoplasm of upper-outer quadrant of right female breast: Secondary | ICD-10-CM

## 2024-01-14 NOTE — Assessment & Plan Note (Addendum)
 Right lumpectomy: IDC with DCIS, 1.4 cm, grade 3, DCIS focally involving posterior margin, 0/3 lymph nodes negative, ER 90%, PR 0%, HER-2 positive ratio 2.6, Ki-67 40% T1 CN 0 stage IA Tumor board did not recommend surgery for the positive posterior margin issue   Recommendation: 1. Adjuvant chemotherapy with TCH followed by Herceptin maintenance for a year started 09/07/2018-12/21/2017  3. Adjuvant radiation started 01/19/2018 4. Followed by adjuvant antiestrogen therapy with tamoxifen that was started prior to surgery -------------------------------------------------------------------- Current Treatment: Tamoxifen 20 mg daily started 08/12/18 dose reduced to 10 mg daily 01/14/2024 (due to severe mood swings)  Tamoxifen Toxicities: 1. Severe emotional outbursts: Currently on Effexor at 150 mg daily.   2. Weight loss: unable to gain weight after losing significant amount of weight through intermittent fasting.    Breast Cancer Surveillance:  Mammogram and ultrasound 01/07/2023: Benign, breast density category C Breast exam 07/01/2023: Benign   Profound mood swings: Making it very difficult for her to function at home and at work.  She had to take FMLA because of this.  We will see if reducing the dosage of tamoxifen would solve this issue.  If not we may have to switch her antidepressant.

## 2024-01-14 NOTE — Progress Notes (Signed)
 HEMATOLOGY-ONCOLOGY TELEPHONE VISIT PROGRESS NOTE  I connected with our patient on 01/14/24 at 11:45 AM EST by telephone and verified that I am speaking with the correct person using two identifiers.  I discussed the limitations, risks, security and privacy concerns of performing an evaluation and management service by telephone and the availability of in person appointments.  I also discussed with the patient that there may be a patient responsible charge related to this service. The patient expressed understanding and agreed to proceed.   History of Present Illness:    History of Present Illness   Brianna [name] is a 43 year old female who presents with weight loss and mood swings.  She has been experiencing ongoing weight loss and lack of appetite. Initially, she began fasting last year, which led to significant weight loss. However, she now finds herself unable to eat even when not fasting, describing a persistent lack of appetite. She attributes some of her weight loss to a recent illness, possibly flu or COVID, during which she lost an additional ten pounds. Her current weight is approximately 115 pounds, down from 125 pounds, and she is struggling to regain weight.  She reports mood swings, describing herself as 'very moody' and 'not a pleasant person to deal with.' She is currently on a full dose of Effexor (venlafaxine) and tamoxifen, which she has been taking for over five years. She is concerned that her mood swings may be related to her medication regimen.  She experiences episodes of dizziness and nausea, particularly around the time of bowel movements, and has had a fainting spell. Additionally, she reports an aversion to smells, noting that she cannot smell food properly and that the smells she does perceive are unpleasant, such as 'old grease' or 'artificial things.' This aversion has contributed to her lack of desire to eat.        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  The Hereditary Gene Panel offered by [facility] includes sequencing and/or deletion duplication testing of the following 46 genes: APC, ATM, AXIN2, BARD1, BMPR1A, BRCA1, BRCA2, BRIP1, CDH1, CDKN2A (p14ARF), CDKN2A (p16INK4a), CHEK2, CTNNA1, DICER1, EPCAM (Deletion/duplication testing only), GREM1 (promoter region deletion/duplication testing only), KIT, MEN1, MLH1, MSH2, MSH3, MSH6, MUTYH, NBN, NF1, NHTL1, PALB2, PDGFRA, PMS2, POLD1, POLE, PTEN, RAD50, RAD51C, RAD51D, SDHB, SDHC, SDHD, SMAD4, SMARCA4. STK11, TP53, TSC1, TSC2, and VHL.  The following genes were evaluated for sequence changes only: SDHA and HOXB13 c.251G>A variant only.   The report date is August 06, 2017.  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  The updated report date is April 26, 2019.    08/11/2017 Surgery   Right lumpectomy: IDC with DCIS, 1.4 cm, grade 3, DCIS focally involving posterior margin, 0/3 lymph nodes negative, ER 90%, PR 0%, HER-2 positive ratio 2.6,  Ki-67 40% T1 CN 0 stage IA   09/07/2017 - 12/21/2017 Adjuvant Chemotherapy   TCH x 6, then maintenance Herceptin x 1 year (Taxotere not given 4 cycles 5 and 6)   01/19/2018 - 03/04/2018 Radiation Therapy   Adjuvant radiation therapy   08/12/2018  -  Anti-estrogen oral therapy   Tamoxifen     REVIEW OF SYSTEMS:   Constitutional: Denies fevers, chills or abnormal weight loss All other systems were reviewed with the patient and are negative. Observations/Objective:     Assessment Plan:  Malignant neoplasm of upper-outer quadrant of right breast in female, estrogen receptor positive (HCC) Right lumpectomy: IDC with DCIS, 1.4 cm, grade 3, DCIS focally involving posterior margin, 0/3 lymph nodes negative, ER 90%, PR 0%, HER-2 positive ratio 2.6, Ki-67 40% T1 CN 0 stage IA Tumor board did not recommend surgery for the positive posterior margin issue   Recommendation: 1. Adjuvant chemotherapy with TCH followed by Herceptin maintenance for a year started 09/07/2018-12/21/2017  3. Adjuvant radiation started 01/19/2018 4. Followed by adjuvant antiestrogen therapy with tamoxifen that was started prior to surgery -------------------------------------------------------------------- Current Treatment: Tamoxifen 20 mg daily started 08/12/18 dose reduced to 10 mg daily 01/14/2024 (due to severe mood swings)  Tamoxifen Toxicities: 1. Severe emotional outbursts: Currently on Effexor at 150 mg daily.   2. Weight loss: unable to gain weight after losing significant amount of weight through intermittent fasting.    Breast Cancer Surveillance:  Mammogram and ultrasound 01/07/2023: Benign, breast density category C Breast exam 07/01/2023: Benign   Profound mood swings: Making it very difficult for her to function at home and at work.  She had to take FMLA because of this.  We will see if reducing the dosage of tamoxifen would solve this issue.  If not we may have to switch her antidepressant.  --------------------------------- Assessment and Plan    Weight Loss and Anorexia Significant weight loss (10 lbs, currently 115 lbs) and anorexia, potentially related to fasting, medication side effects, or recent illness (flu or COVID). Reports dizziness,  nausea, and fainting spells associated with bowel movements. Differential diagnosis includes medication side effects, gastrointestinal issues, or other systemic conditions. Discussed need for full body scan with IV contrast and blood work to check kidney function. - Order full body scan with IV contrast. - Schedule blood work to check kidney function on the day of the scan. - Follow up with gastroenterologist on March 6th.  Olfactory Dysfunction Aversion to smells and inability to enjoy food due to altered smell perception, ongoing since end of treatment, coinciding with starting tamoxifen and experiencing COVID-19. Will reassess after full body scan and gastroenterologist consultation. - Reassess after full body scan and gastroenterologist consultation.  Mood Swings Significant mood swings potentially related to tamoxifen. Currently on full dose of Effexor (venlafaxine) and tamoxifen. Discussed reducing tamoxifen dose to half a tablet daily. Explained that some patients tolerate one-fourth of current dose and still achieve efficacy. Patient agreed to trial dose reduction for one month. - Reduce tamoxifen dose to half a tablet daily. - Maintain current dose of Effexor. - Reassess in one month to evaluate impact of tamoxifen dose reduction.  Follow-up - Schedule follow-up appointment in four weeks to review scan results and reassess symptoms.          I discussed the assessment and treatment plan with the patient. The patient was provided an opportunity to ask questions and all were answered. The patient agreed with the plan and demonstrated an understanding of the  instructions. The patient was advised to call back or seek an in-person evaluation if the symptoms worsen or if the condition fails to improve as anticipated.   I provided 20 minutes of non-face-to-face time during this encounter.  This includes time for charting and coordination of care   Tamsen Meek, MD

## 2024-01-14 NOTE — Telephone Encounter (Signed)
 Scheduled appointment per 2/121 los. Patient is aware of the made appointment.

## 2024-01-19 ENCOUNTER — Other Ambulatory Visit: Payer: Self-pay | Admitting: *Deleted

## 2024-01-19 NOTE — Telephone Encounter (Signed)
 Received call from pt requesting MD to fill out FMLA paperwork for the next few weeks.  Pt states she is experiencing severe mood swings while on Tamoxifen.  MD has increased pt dose of Effexor and reduced Tamoxifen dose to monitor for better tolerance. MD states he will sign FMLA paperwork until February 22, 2024.

## 2024-01-27 ENCOUNTER — Telehealth: Payer: Self-pay

## 2024-01-27 NOTE — Telephone Encounter (Signed)
 Notified the pt about signing ROI. Pt will be in tomorrow.

## 2024-01-28 ENCOUNTER — Ambulatory Visit (HOSPITAL_COMMUNITY)
Admission: RE | Admit: 2024-01-28 | Discharge: 2024-01-28 | Disposition: A | Discharge status: Home / Self Care | Payer: 59 | Source: Ambulatory Visit | Attending: Hematology and Oncology | Admitting: Hematology and Oncology

## 2024-01-28 ENCOUNTER — Telehealth: Payer: Self-pay

## 2024-01-28 ENCOUNTER — Telehealth: Payer: Self-pay | Admitting: *Deleted

## 2024-01-28 ENCOUNTER — Inpatient Hospital Stay: Payer: 59 | Attending: Hematology and Oncology

## 2024-01-28 ENCOUNTER — Encounter (HOSPITAL_COMMUNITY): Payer: Self-pay

## 2024-01-28 DIAGNOSIS — Z79810 Long term (current) use of selective estrogen receptor modulators (SERMs): Secondary | ICD-10-CM | POA: Insufficient documentation

## 2024-01-28 DIAGNOSIS — D259 Leiomyoma of uterus, unspecified: Secondary | ICD-10-CM | POA: Insufficient documentation

## 2024-01-28 DIAGNOSIS — Z170 Estrogen receptor positive status [ER+]: Secondary | ICD-10-CM | POA: Insufficient documentation

## 2024-01-28 DIAGNOSIS — N838 Other noninflammatory disorders of ovary, fallopian tube and broad ligament: Secondary | ICD-10-CM | POA: Diagnosis present

## 2024-01-28 DIAGNOSIS — C50411 Malignant neoplasm of upper-outer quadrant of right female breast: Secondary | ICD-10-CM | POA: Insufficient documentation

## 2024-01-28 DIAGNOSIS — R630 Anorexia: Secondary | ICD-10-CM | POA: Diagnosis not present

## 2024-01-28 LAB — CBC WITH DIFFERENTIAL (CANCER CENTER ONLY)
Abs Immature Granulocytes: 0.01 10*3/uL (ref 0.00–0.07)
Basophils Absolute: 0 10*3/uL (ref 0.0–0.1)
Basophils Relative: 1 %
Eosinophils Absolute: 0 10*3/uL (ref 0.0–0.5)
Eosinophils Relative: 1 %
HCT: 37.4 % (ref 36.0–46.0)
Hemoglobin: 12.1 g/dL (ref 12.0–15.0)
Immature Granulocytes: 0 %
Lymphocytes Relative: 53 %
Lymphs Abs: 2.5 10*3/uL (ref 0.7–4.0)
MCH: 29.9 pg (ref 26.0–34.0)
MCHC: 32.4 g/dL (ref 30.0–36.0)
MCV: 92.3 fL (ref 80.0–100.0)
Monocytes Absolute: 0.5 10*3/uL (ref 0.1–1.0)
Monocytes Relative: 9 %
Neutro Abs: 1.7 10*3/uL (ref 1.7–7.7)
Neutrophils Relative %: 36 %
Platelet Count: 269 10*3/uL (ref 150–400)
RBC: 4.05 MIL/uL (ref 3.87–5.11)
RDW: 14.8 % (ref 11.5–15.5)
WBC Count: 4.8 10*3/uL (ref 4.0–10.5)
nRBC: 0 % (ref 0.0–0.2)

## 2024-01-28 LAB — CMP (CANCER CENTER ONLY)
ALT: 13 U/L (ref 0–44)
AST: 19 U/L (ref 15–41)
Albumin: 4.2 g/dL (ref 3.5–5.0)
Alkaline Phosphatase: 41 U/L (ref 38–126)
Anion gap: 4 — ABNORMAL LOW (ref 5–15)
BUN: 15 mg/dL (ref 6–20)
CO2: 29 mmol/L (ref 22–32)
Calcium: 8.7 mg/dL — ABNORMAL LOW (ref 8.9–10.3)
Chloride: 103 mmol/L (ref 98–111)
Creatinine: 0.76 mg/dL (ref 0.44–1.00)
GFR, Estimated: >60 mL/min (ref >60)
Glucose, Bld: 79 mg/dL (ref 70–99)
Potassium: 4.1 mmol/L (ref 3.5–5.1)
Sodium: 136 mmol/L (ref 135–145)
Total Bilirubin: 0.3 mg/dL (ref 0.0–1.2)
Total Protein: 7 g/dL (ref 6.5–8.1)

## 2024-01-28 MED ORDER — IOHEXOL 350 MG/ML SOLN: 100.0000 mL | Freq: Once | INTRAVENOUS | Status: DC | PRN

## 2024-01-28 MED ORDER — IOHEXOL 300 MG/ML  SOLN
100.0000 mL | Freq: Once | INTRAMUSCULAR | Status: AC | PRN
  Administered 2024-01-28: 100 mL via INTRAVENOUS

## 2024-01-28 NOTE — Telephone Encounter (Signed)
 Brianna [name] in forms office to sign [employer] [employer] for [employer] paperwork before STAT lab appointment.  "Am I able to eat?  Will be here all day for a CT Scan of my chest at 3:00 pm.  I do not want to pass out."  This nurse advised her not to eat.   Noted Ct scan is chest, abdomen and pelvis.  Connected with CT Imaging Control [id number]) with above information.  Per Mingo Amber., "patients may have clear liquids ONLY, four hours before CT of C/A/P.  Send her over now and we should be able to work her in." [employer] provided above good news. At 1135 patient en route to [facility] Radiology Department to be worked in for CT C/A/P.  Reports she last ate yesterday evening.  Denies further questions or needs.

## 2024-01-28 NOTE — Telephone Encounter (Signed)
 Informed the pt of completed forms for [employer] Disability. Faxed and Confirmation received. Pt will picked up for their copy.

## 2024-02-08 ENCOUNTER — Encounter: Payer: Self-pay | Admitting: Hematology and Oncology

## 2024-02-10 ENCOUNTER — Inpatient Hospital Stay (HOSPITAL_BASED_OUTPATIENT_CLINIC_OR_DEPARTMENT_OTHER): Payer: 59 | Admitting: Hematology and Oncology

## 2024-02-10 VITALS — BP 124/66 | HR 86 | Temp 98.5°F | Resp 18 | Ht 63.0 in | Wt 119.1 lb

## 2024-02-10 DIAGNOSIS — C50411 Malignant neoplasm of upper-outer quadrant of right female breast: Secondary | ICD-10-CM

## 2024-02-10 DIAGNOSIS — N838 Other noninflammatory disorders of ovary, fallopian tube and broad ligament: Secondary | ICD-10-CM | POA: Diagnosis not present

## 2024-02-10 DIAGNOSIS — Z170 Estrogen receptor positive status [ER+]: Secondary | ICD-10-CM | POA: Diagnosis not present

## 2024-02-10 MED ORDER — TAMOXIFEN CITRATE 20 MG PO TABS: 10.0000 mg | ORAL_TABLET | Freq: Every day | ORAL | Status: DC

## 2024-02-10 NOTE — Progress Notes (Signed)
 Patient Care Team: [place], [name], MD as PCP - General (Family Medicine) Serena Croissant, MD as Consulting Physician (Hematology and Oncology) Manus Rudd, MD as Consulting Physician (General Surgery) Dorothy Puffer, MD as Consulting Physician (Radiation Oncology) Axel Filler Larna Daughters, NP as Nurse Practitioner (Hematology and Oncology)  DIAGNOSIS:  Encounter Diagnosis  Name Primary?   Malignant neoplasm of upper-outer quadrant of right breast in female, estrogen receptor positive (HCC) Yes    SUMMARY OF ONCOLOGIC HISTORY: Oncology History  Malignant neoplasm of upper-outer quadrant of right breast in female, estrogen receptor positive (HCC)  07/15/2017 Initial Diagnosis   Patient palpated a week mass in the right upper outer quadrant breast near the axilla in March 2018 mammogram revealed irregular spiculated mass 1.4 cm, by ultrasound measured 1.1 cm with a suspicious right axillary lymph node; biopsy revealed IDC grade 3, lymph node benign, ER 90%, PR 0%, Ki-67 40%, HER-2 positive ratio 2.6, T1c N0 stage IA, AJCC 8 clinical stage   08/06/2017 Genetic Testing   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  The Hereditary Gene Panel offered by [facility] includes sequencing and/or deletion duplication testing of the following 46 genes: APC, ATM, AXIN2, BARD1, BMPR1A, BRCA1, BRCA2, BRIP1, CDH1, CDKN2A (p14ARF), CDKN2A (p16INK4a), CHEK2, CTNNA1, DICER1, EPCAM (Deletion/duplication testing only), GREM1 (promoter region deletion/duplication testing only), KIT, MEN1, MLH1, MSH2, MSH3, MSH6, MUTYH, NBN, NF1, NHTL1, PALB2, PDGFRA, PMS2, POLD1, POLE, PTEN, RAD50, RAD51C, RAD51D, SDHB, SDHC, SDHD, SMAD4, SMARCA4. STK11, TP53, TSC1, TSC2, and VHL.  The following genes were evaluated for sequence changes only: SDHA and HOXB13 c.251G>A variant only.   The report date is August 06, 2017.  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  The updated report date is April 26, 2019.    08/11/2017 Surgery   Right lumpectomy: IDC with DCIS, 1.4 cm, grade 3, DCIS focally involving posterior margin, 0/3 lymph nodes negative, ER 90%, PR 0%, HER-2 positive ratio 2.6, Ki-67 40% T1 CN 0 stage IA   09/07/2017 - 12/21/2017 Adjuvant Chemotherapy   TCH x 6, then maintenance Herceptin x 1 year (Taxotere not given 4 cycles 5 and 6)   01/19/2018 - 03/04/2018 Radiation Therapy   Adjuvant radiation therapy   08/12/2018 -  Anti-estrogen oral therapy   Tamoxifen     CHIEF COMPLIANT: Follow-up on tamoxifen therapy   History of Present Illness Brianna [name] is a 43 year old female who presents with weight changes and concerns about fibroids.  She has experienced recent weight changes, initially losing weight but now gaining, with an improved appetite. Her weight has increased from 115 pounds. She is concerned about a pedunculated fibroid, present since she was 46 or 43 years old, causing discomfort during pelvic exams. A recent CT scan showed no evidence of cancer but revealed a thickening of the uterine lining and the fibroid.  She is currently on tamoxifen and Effexor, noting that she sometimes misses doses of Effexor when she runs out of tamoxifen, which affects her. She describes experiencing a 'buzz in my head' when missing Effexor doses.     ALLERGIES:  has no known allergies.  MEDICATIONS:  Current Outpatient Medications  Medication Sig Dispense Refill   cholecalciferol (VITAMIN D3) 25 MCG (1000 UNIT) tablet Take 1,000 Units by mouth daily.     cyanocobalamin (VITAMIN B12) 100 MCG tablet Take 100  mcg by mouth daily.     venlafaxine XR (EFFEXOR-XR) 150 MG 24 hr capsule TAKE 1 CAPSULE BY MOUTH DAILY WITH BREAKFAST. 30 capsule 5   tamoxifen (NOLVADEX) 20 MG tablet Take 0.5 tablets (10 mg total) by mouth daily.     No  current facility-administered medications for this visit.   Facility-Administered Medications Ordered in Other Visits  Medication Dose Route Frequency Provider Last Rate Last Admin   sodium chloride flush (NS) 0.9 % injection 10 mL  10 mL Intracatheter PRN Serena Croissant, MD   10 mL at 07/20/18 1536    PHYSICAL EXAMINATION: ECOG PERFORMANCE STATUS: 1 - Symptomatic but completely ambulatory  Vitals:   02/10/24 1524  BP: 124/66  Pulse: 86  Resp: 18  Temp: 98.5 F (36.9 C)  SpO2: 100%   Filed Weights   02/10/24 1524  Weight: 119 lb 1.6 oz (54 kg)      LABORATORY DATA:  I have reviewed the data as listed    Latest Ref Rng & Units 01/28/2024   11:16 AM 09/22/2023    2:54 PM 07/04/2020    6:02 PM  CMP  Glucose 70 - 99 mg/dL 79  75  161   BUN 6 - 20 mg/dL 15  12  6    Creatinine 0.44 - 1.00 mg/dL 0.96  0.45  4.09   Sodium 135 - 145 mmol/L 136  138  136   Potassium 3.5 - 5.1 mmol/L 4.1  3.7  3.4   Chloride 98 - 111 mmol/L 103  103  104   CO2 22 - 32 mmol/L 29  25  24    Calcium 8.9 - 10.3 mg/dL 8.7  9.1  8.8   Total Protein 6.5 - 8.1 g/dL 7.0  7.2  6.9   Total Bilirubin 0.0 - 1.2 mg/dL 0.3  0.3  0.3   Alkaline Phos 38 - 126 U/L 41  38  47   AST 15 - 41 U/L 19  17  24    ALT 0 - 44 U/L 13  14  21      Lab Results  Component Value Date   WBC 4.8 01/28/2024   HGB 12.1 01/28/2024   HCT 37.4 01/28/2024   MCV 92.3 01/28/2024   PLT 269 01/28/2024   NEUTROABS 1.7 01/28/2024    ASSESSMENT & PLAN:  Malignant neoplasm of upper-outer quadrant of right breast in female, estrogen receptor positive (HCC) Right lumpectomy: IDC with DCIS, 1.4 cm, grade 3, DCIS focally involving posterior margin, 0/3 lymph nodes negative, ER 90%, PR 0%, HER-2 positive ratio 2.6, Ki-67 40% T1 CN 0 stage IA Tumor board did not recommend surgery for the positive posterior margin issue   Recommendation: 1. Adjuvant chemotherapy with TCH followed by Herceptin maintenance for a year started  09/07/2018-12/21/2017  3. Adjuvant radiation started 01/19/2018 4. Followed by adjuvant antiestrogen therapy with tamoxifen that was started prior to surgery -------------------------------------------------------------------- Current Treatment: Tamoxifen 20 mg daily started 08/12/18 dose reduced to 10 mg daily 01/14/2024 (due to severe mood swings)   Tamoxifen Toxicities: 1. Severe emotional outbursts: Currently on Effexor at 150 mg daily.   2. Weight loss: unable to gain weight after losing significant amount of weight through intermittent fasting.     Breast Cancer Surveillance:  Mammogram 01/15/2024: Benign, breast density category C Breast exam 07/01/2023: Benign CT CAP 01/28/2024: No evidence of metastatic disease.  Lobular uterine contour with enhancement of myometrium, left adnexal soft tissue nodule 5.5 cm (could be pedunculated uterine fibroid)  Profound mood swings: Improved with lower doses of tamoxifen Profound weight loss: Starting to improve with lower dose of tamoxifen.  She is fine to proceed with going back to work but will need intermittent FMLA.Marland Kitchen   Recommendation: GYN consultation for the adnexal mass and the enhancement of the uterus ------------------------------------- Assessment and Plan Assessment & Plan Malignant neoplasm of upper-outer quadrant of right breast, estrogen receptor positive No recurrence on recent imaging. Tamoxifen 10 mg daily well-tolerated with improvements. - Continue Tamoxifen 10 mg daily. - Schedule annual follow-up mammogram in February.  Weight loss and anorexia Improved appetite and weight gain. Weight loss unrelated to cancer recurrence. H. pylori and gastritis may contribute to symptoms. Awaiting biopsy results. - Monitor weight and appetite. - Await biopsy results. - Consider treatment for gastritis and H. pylori if indicated.  Mood swings Emotional state improving. Effexor 150 mg daily with withdrawal symptoms if missed. - Continue  Effexor 150 mg daily. - Educate on medication adherence. - Discuss potential need for medication adjustment in future visits.  Uterine fibroid Pedunculated fibroid causing discomfort. Considering removal but concerned about hysterectomy. Advised to consult gynecologist. - Refer to gynecology for evaluation and management. - Discuss potential need for pelvic ultrasound.  Return to work and [employer] Scheduled to return to work. Considering intermittent FMLA due to health issues. On short-term disability seeking guidance. - Provide documentation for return to work. - Assist with FMLA paperwork as needed.      No orders of the defined types were placed in this encounter.  The patient has a good understanding of the overall plan. she agrees with it. she will call with any problems that may develop before the next visit here. Total time spent: 30 mins including face to face time and time spent for planning, charting and co-ordination of care   Tamsen Meek, MD 02/10/24

## 2024-02-10 NOTE — Assessment & Plan Note (Signed)
 Right lumpectomy: IDC with DCIS, 1.4 cm, grade 3, DCIS focally involving posterior margin, 0/3 lymph nodes negative, ER 90%, PR 0%, HER-2 positive ratio 2.6, Ki-67 40% T1 CN 0 stage IA Tumor board did not recommend surgery for the positive posterior margin issue   Recommendation: 1. Adjuvant chemotherapy with TCH followed by Herceptin maintenance for a year started 09/07/2018-12/21/2017  3. Adjuvant radiation started 01/19/2018 4. Followed by adjuvant antiestrogen therapy with tamoxifen that was started prior to surgery -------------------------------------------------------------------- Current Treatment: Tamoxifen 20 mg daily started 08/12/18 dose reduced to 10 mg daily 01/14/2024 (due to severe mood swings)   Tamoxifen Toxicities: 1. Severe emotional outbursts: Currently on Effexor at 150 mg daily.   2. Weight loss: unable to gain weight after losing significant amount of weight through intermittent fasting.     Breast Cancer Surveillance:  Mammogram 01/15/2024: Benign, breast density category C Breast exam 07/01/2023: Benign CT CAP 01/28/2024: No evidence of metastatic disease.  Lobular uterine contour with enhancement of myometrium, left adnexal soft tissue nodule 5.5 cm (could be pedunculated uterine fibroid)   Profound mood swings: Making it very difficult for her to function at home and at work.  She had to take FMLA because of this.  We will see if reducing the dosage of tamoxifen would solve this issue.  If not we may have to switch her antidepressant.   Recommendation: GYN consultation for the adnexal mass and the enhancement of the uterus

## 2024-02-28 ENCOUNTER — Telehealth: Payer: Self-pay

## 2024-02-28 NOTE — Telephone Encounter (Signed)
 Notified the patient to let her know that her FMLA forms were completed and faxed over,, when confirmation received. Patients copy was mailed as requested. No other questions or concerns at this time.

## 2024-03-02 ENCOUNTER — Other Ambulatory Visit: Payer: Self-pay | Admitting: Hematology and Oncology

## 2024-03-02 DIAGNOSIS — F320 Major depressive disorder, single episode, mild: Secondary | ICD-10-CM

## 2024-07-22 ENCOUNTER — Other Ambulatory Visit: Payer: Self-pay | Admitting: Hematology and Oncology

## 2024-07-25 ENCOUNTER — Encounter: Payer: Self-pay | Admitting: Hematology and Oncology

## 2024-08-08 ENCOUNTER — Other Ambulatory Visit: Payer: Self-pay | Admitting: Hematology and Oncology

## 2024-08-08 DIAGNOSIS — F320 Major depressive disorder, single episode, mild: Secondary | ICD-10-CM

## 2024-08-14 ENCOUNTER — Inpatient Hospital Stay: Attending: Hematology and Oncology | Admitting: Hematology and Oncology

## 2024-08-14 VITALS — BP 125/83 | HR 89 | Resp 18 | Ht 63.0 in | Wt 134.0 lb

## 2024-08-14 DIAGNOSIS — Z79899 Other long term (current) drug therapy: Secondary | ICD-10-CM | POA: Diagnosis not present

## 2024-08-14 DIAGNOSIS — Z79810 Long term (current) use of selective estrogen receptor modulators (SERMs): Secondary | ICD-10-CM | POA: Insufficient documentation

## 2024-08-14 DIAGNOSIS — Z1731 Human epidermal growth factor receptor 2 positive status: Secondary | ICD-10-CM | POA: Insufficient documentation

## 2024-08-14 DIAGNOSIS — Z1722 Progesterone receptor negative status: Secondary | ICD-10-CM | POA: Diagnosis not present

## 2024-08-14 DIAGNOSIS — Z170 Estrogen receptor positive status [ER+]: Secondary | ICD-10-CM | POA: Insufficient documentation

## 2024-08-14 DIAGNOSIS — C50411 Malignant neoplasm of upper-outer quadrant of right female breast: Secondary | ICD-10-CM | POA: Diagnosis not present

## 2024-08-14 NOTE — Assessment & Plan Note (Signed)
 Right lumpectomy: IDC with DCIS, 1.4 cm, grade 3, DCIS focally involving posterior margin, 0/3 lymph nodes negative, ER 90%, PR 0%, HER-2 positive ratio 2.6, Ki-67 40% T1 CN 0 stage IA Tumor board did not recommend surgery for the positive posterior margin issue   Recommendation: 1. Adjuvant chemotherapy with TCH followed by Herceptin  maintenance for a year started 09/07/2018-12/21/2017  3. Adjuvant radiation started 01/19/2018 4. Followed by adjuvant antiestrogen therapy with tamoxifen  that was started prior to surgery -------------------------------------------------------------------- Current Treatment: Tamoxifen  20 mg daily started 08/12/18 dose reduced to 10 mg daily 01/14/2024 (due to severe mood swings)   Tamoxifen  Toxicities: 1. Severe emotional outbursts: Currently on Effexor  at 150 mg daily.   2. Weight loss: unable to gain weight after losing significant amount of weight through intermittent fasting.     Breast Cancer Surveillance:  Mammogram 01/15/2024: Benign, breast density category C Breast exam 08/14/2024: Benign CT CAP 01/28/2024: No evidence of metastatic disease.  Lobular uterine contour with enhancement of myometrium, left adnexal soft tissue nodule 5.5 cm (could be pedunculated uterine fibroid)   Profound mood swings: Improved with lower doses of tamoxifen  Profound weight loss: Starting to improve with lower dose of tamoxifen .   She is fine to proceed with going back to work but will need intermittent FMLA.SABRA   Recommendation: GYN consultation for the adnexal mass and the enhancement of the uterus

## 2024-08-14 NOTE — Progress Notes (Signed)
 Patient Care Team: [place], [name], MD as PCP - General (Family Medicine) Odean Potts, MD as Consulting Physician (Hematology and Oncology) Belinda Cough, MD as Consulting Physician (General Surgery) Dewey Rush, MD as Consulting Physician (Radiation Oncology) Crawford Morna Pickle, NP as Nurse Practitioner (Hematology and Oncology)  DIAGNOSIS:  Encounter Diagnosis  Name Primary?   Malignant neoplasm of upper-outer quadrant of right breast in female, estrogen receptor positive (HCC) Yes    SUMMARY OF ONCOLOGIC HISTORY: Oncology History  Malignant neoplasm of upper-outer quadrant of right breast in female, estrogen receptor positive (HCC)  07/15/2017 Initial Diagnosis   Patient palpated a week mass in the right upper outer quadrant breast near the axilla in March 2018 mammogram revealed irregular spiculated mass 1.4 cm, by ultrasound measured 1.1 cm with a suspicious right axillary lymph node; biopsy revealed IDC grade 3, lymph node benign, ER 90%, PR 0%, Ki-67 40%, HER-2 positive ratio 2.6, T1c N0 stage IA, AJCC 8 clinical stage   08/06/2017 Genetic Testing   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  The Hereditary Gene Panel offered by [facility] includes sequencing and/or deletion duplication testing of the following 46 genes: APC, ATM, AXIN2, BARD1, BMPR1A, BRCA1, BRCA2, BRIP1, CDH1, CDKN2A (p14ARF), CDKN2A (p16INK4a), CHEK2, CTNNA1, DICER1, EPCAM (Deletion/duplication testing only), GREM1 (promoter region deletion/duplication testing only), KIT, MEN1, MLH1, MSH2, MSH3, MSH6, MUTYH, NBN, NF1, NHTL1, PALB2, PDGFRA, PMS2, POLD1, POLE, PTEN, RAD50, RAD51C, RAD51D, SDHB, SDHC, SDHD, SMAD4, SMARCA4. STK11, TP53, TSC1, TSC2, and VHL.  The following genes were evaluated for sequence changes only: SDHA and HOXB13 c.251G>A variant only.   The report date is August 06, 2017.  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  The updated report date is April 26, 2019.    08/11/2017 Surgery   Right lumpectomy: IDC with DCIS, 1.4 cm, grade 3, DCIS focally involving posterior margin, 0/3 lymph nodes negative, ER 90%, PR 0%, HER-2 positive ratio 2.6, Ki-67 40% T1 CN 0 stage IA   09/07/2017 - 12/21/2017 Adjuvant Chemotherapy   TCH x 6, then maintenance Herceptin  x 1 year (Taxotere  not given 4 cycles 5 and 6)   01/19/2018 - 03/04/2018 Radiation Therapy   Adjuvant radiation therapy   08/12/2018 -  Anti-estrogen oral therapy   Tamoxifen      CHIEF COMPLIANT: Follow-up on tamoxifen  therapy  HISTORY OF PRESENT ILLNESS: History of Present Illness Brianna [name] is a 43 year old female with breast cancer who presents for follow-up regarding her ongoing treatment and mental health management.  She is on tamoxifen  therapy for breast cancer and has experienced weight gain since her last visit in May. She manages her mental health with Effexor  and buspirone, which have been adjusted to 7.5 mg twice daily. 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 Despite these challenges, she feels more hopeful about the future.     ALLERGIES:  has no known allergies.  MEDICATIONS:  Current Outpatient Medications  Medication Sig Dispense Refill   busPIRone (BUSPAR) 7.5 MG tablet Take 7.5 mg by mouth 2 (two) times daily.     tamoxifen  (NOLVADEX ) 20 MG tablet TAKE 1 TABLET BY MOUTH EVERY DAY 90 tablet 3   venlafaxine  XR (EFFEXOR -XR) 150 MG 24 hr capsule TAKE 1 CAPSULE BY MOUTH DAILY WITH BREAKFAST. 90 capsule 3  No current facility-administered medications for this visit.   Facility-Administered Medications Ordered in Other Visits  Medication Dose Route Frequency Provider Last Rate Last Admin   sodium chloride  flush (NS) 0.9 % injection 10 mL  10 mL  Intracatheter PRN Odean Potts, MD   10 mL at 07/20/18 1536    PHYSICAL EXAMINATION: ECOG PERFORMANCE STATUS: 1 - Symptomatic but completely ambulatory  Vitals:   08/14/24 1525  BP: 125/83  Pulse: 89  Resp: 18  SpO2: 100%   Filed Weights   08/14/24 1525  Weight: 134 lb (60.8 kg)    Physical Exam MEASUREMENTS: Weight- 117 lbs.  (exam performed in the presence of a chaperone)  LABORATORY DATA:  I have reviewed the data as listed    Latest Ref Rng & Units 01/28/2024   11:16 AM 09/22/2023    2:54 PM 07/04/2020    6:02 PM  CMP  Glucose 70 - 99 mg/dL 79  75  893   BUN 6 - 20 mg/dL 15  12  6    Creatinine 0.44 - 1.00 mg/dL 9.23  9.18  9.23   Sodium 135 - 145 mmol/L 136  138  136   Potassium 3.5 - 5.1 mmol/L 4.1  3.7  3.4   Chloride 98 - 111 mmol/L 103  103  104   CO2 22 - 32 mmol/L 29  25  24    Calcium 8.9 - 10.3 mg/dL 8.7  9.1  8.8   Total Protein 6.5 - 8.1 g/dL 7.0  7.2  6.9   Total Bilirubin 0.0 - 1.2 mg/dL 0.3  0.3  0.3   Alkaline Phos 38 - 126 U/L 41  38  47   AST 15 - 41 U/L 19  17  24    ALT 0 - 44 U/L 13  14  21      Lab Results  Component Value Date   WBC 4.8 01/28/2024   HGB 12.1 01/28/2024   HCT 37.4 01/28/2024   MCV 92.3 01/28/2024   PLT 269 01/28/2024   NEUTROABS 1.7 01/28/2024    ASSESSMENT & PLAN:  Malignant neoplasm of upper-outer quadrant of right breast in female, estrogen receptor positive (HCC) Right lumpectomy: IDC with DCIS, 1.4 cm, grade 3, DCIS focally involving posterior margin, 0/3 lymph nodes negative, ER 90%, PR 0%, HER-2 positive ratio 2.6, Ki-67 40% T1 CN 0 stage IA Tumor board did not recommend surgery for the positive posterior margin issue   Recommendation: 1. Adjuvant chemotherapy with TCH followed by Herceptin  maintenance for a year started 09/07/2018-12/21/2017  3. Adjuvant radiation started 01/19/2018 4. Followed by adjuvant antiestrogen therapy with tamoxifen  that was started prior to  surgery -------------------------------------------------------------------- Current Treatment: Tamoxifen  20 mg daily started 08/12/18 dose reduced to 10 mg daily 01/14/2024 (due to severe mood swings)   Tamoxifen  Toxicities: 1. Severe emotional outbursts: Currently on Effexor  at 150 mg daily.   2. Weight loss: unable to gain weight after losing significant amount of weight through intermittent fasting.     Breast Cancer Surveillance:  Mammogram 01/15/2024: Benign, breast density category C Breast exam 08/14/2024: Benign CT CAP 01/28/2024: No evidence of metastatic disease.  Lobular uterine contour with enhancement of myometrium, left adnexal soft tissue nodule 5.5 cm (could be pedunculated uterine fibroid)   Profound mood swings: Improved with lower doses of tamoxifen , she is also on antianxiety medication. She has significant stressors at work but she is able to handle it much better since she is seeing a Veterinary surgeon.  Profound weight loss: Starting  to improve with lower dose of tamoxifen .   I recommended that she continue with intermittent FMLA.SABRA   Recommendation: GYN consultation for the adnexal mass and the enhancement of the uterus ------------------------------------- Assessment and Plan Assessment & Plan Malignant neoplasm of upper-outer quadrant of right breast Status post-treatment, on tamoxifen . Weight gain indicates improved health. Discussed stress and anxiety concerns related to cancer. - Continue tamoxifen  10 mg oral daily.  Anxiety and depression Managed with venlafaxine , buspirone, and psychotherapy. Improved emotional understanding and stress management. Intermittent FMLA beneficial. - Continue venlafaxine  XR 150 mg oral daily. - Continue buspirone 7.5 mg twice daily as prescribed by her therapist.      No orders of the defined types were placed in this encounter.  The patient has a good understanding of the overall plan. she agrees with it. she will call with any  problems that may develop before the next visit here. Total time spent: 30 mins including face to face time and time spent for planning, charting and co-ordination of care   Naomi MARLA Chad, MD 08/14/24

## 2024-08-31 ENCOUNTER — Telehealth: Payer: Self-pay

## 2024-08-31 NOTE — Telephone Encounter (Signed)
 Called the pt in regards to her FMLA forms, being completed,faxed, and confirmation received. Pt hard copy to be mailed as requested.

## 2025-08-16 ENCOUNTER — Ambulatory Visit: Admitting: Hematology and Oncology
